# Patient Record
Sex: Female | Born: 1980 | Race: White | Hispanic: No | Marital: Single | Smoking: Former smoker
Health system: Southern US, Community
[De-identification: ages and names within clinical notes are randomized; demographics above are authoritative.]

## PROBLEM LIST (undated history)

## (undated) ENCOUNTER — Inpatient Hospital Stay (HOSPITAL_COMMUNITY): Payer: Self-pay

## (undated) DIAGNOSIS — F0630 Mood disorder due to known physiological condition, unspecified: Secondary | ICD-10-CM

## (undated) DIAGNOSIS — IMO0002 Reserved for concepts with insufficient information to code with codable children: Secondary | ICD-10-CM

## (undated) DIAGNOSIS — F0781 Postconcussional syndrome: Secondary | ICD-10-CM

## (undated) DIAGNOSIS — M24419 Recurrent dislocation, unspecified shoulder: Secondary | ICD-10-CM

## (undated) DIAGNOSIS — B1920 Unspecified viral hepatitis C without hepatic coma: Secondary | ICD-10-CM

## (undated) HISTORY — PX: NO PAST SURGERIES: SHX2092

---

## 2000-06-20 ENCOUNTER — Other Ambulatory Visit: Admission: RE | Admit: 2000-06-20 | Discharge: 2000-06-20 | Payer: Self-pay | Admitting: Obstetrics and Gynecology

## 2001-09-08 ENCOUNTER — Emergency Department (HOSPITAL_COMMUNITY): Admission: EM | Admit: 2001-09-08 | Discharge: 2001-09-09 | Payer: Self-pay | Admitting: Emergency Medicine

## 2010-02-20 ENCOUNTER — Ambulatory Visit: Payer: Self-pay | Admitting: Psychiatry

## 2010-02-20 ENCOUNTER — Other Ambulatory Visit: Payer: Self-pay | Admitting: Emergency Medicine

## 2010-02-20 ENCOUNTER — Inpatient Hospital Stay (HOSPITAL_COMMUNITY): Admission: AD | Admit: 2010-02-20 | Discharge: 2010-03-12 | Payer: Self-pay | Admitting: Psychiatry

## 2010-09-28 LAB — T4, FREE: Free T4: 1.05 ng/dL (ref 0.80–1.80)

## 2010-09-28 LAB — BASIC METABOLIC PANEL
Chloride: 110 mEq/L (ref 96–112)
Creatinine, Ser: 0.73 mg/dL (ref 0.4–1.2)
GFR calc Af Amer: >60 mL/min (ref >60)
Potassium: 4.7 mEq/L (ref 3.5–5.1)
Sodium: 141 mEq/L (ref 135–145)

## 2010-09-28 LAB — URINALYSIS, ROUTINE W REFLEX MICROSCOPIC
Glucose, UA: NEGATIVE mg/dL
Glucose, UA: NEGATIVE mg/dL
Hgb urine dipstick: NEGATIVE
Ketones, ur: NEGATIVE mg/dL
Protein, ur: NEGATIVE mg/dL
Specific Gravity, Urine: 1.024 (ref 1.005–1.030)
pH: 7.5 (ref 5.0–8.0)

## 2010-09-28 LAB — DIFFERENTIAL
Eosinophils Relative: 0 % (ref 0–5)
Lymphocytes Relative: 27 % (ref 12–46)
Lymphs Abs: 1.2 10*3/uL (ref 0.7–4.0)

## 2010-09-28 LAB — URINE MICROSCOPIC-ADD ON

## 2010-09-28 LAB — HEPATIC FUNCTION PANEL
ALT: 15 U/L (ref 0–35)
AST: 32 U/L (ref 0–37)
Bilirubin, Direct: <0.1 mg/dL (ref 0.0–0.3)
Total Protein: 7.2 g/dL (ref 6.0–8.3)

## 2010-09-28 LAB — CBC
MCV: 90.8 fL (ref 78.0–100.0)
Platelets: 257 10*3/uL (ref 150–400)
RBC: 3.9 MIL/uL (ref 3.87–5.11)
WBC: 4.5 10*3/uL (ref 4.0–10.5)

## 2010-09-28 LAB — POCT PREGNANCY, URINE: Preg Test, Ur: NEGATIVE

## 2010-09-28 LAB — RAPID URINE DRUG SCREEN, HOSP PERFORMED: Benzodiazepines: POSITIVE — AB

## 2010-09-28 LAB — ETHANOL: Alcohol, Ethyl (B): <5 mg/dL (ref 0–10)

## 2010-09-28 LAB — T3, FREE: T3, Free: 2.1 pg/mL — ABNORMAL LOW (ref 2.3–4.2)

## 2010-10-01 ENCOUNTER — Emergency Department (HOSPITAL_COMMUNITY)
Admission: EM | Admit: 2010-10-01 | Discharge: 2010-10-02 | Disposition: A | Discharge status: Other Acute Inpatient Hospital | Payer: Medicaid Other | Source: Home / Self Care | Attending: Emergency Medicine | Admitting: Emergency Medicine

## 2010-10-01 DIAGNOSIS — M549 Dorsalgia, unspecified: Secondary | ICD-10-CM | POA: Insufficient documentation

## 2010-10-01 DIAGNOSIS — R61 Generalized hyperhidrosis: Secondary | ICD-10-CM | POA: Insufficient documentation

## 2010-10-01 DIAGNOSIS — F329 Major depressive disorder, single episode, unspecified: Secondary | ICD-10-CM | POA: Insufficient documentation

## 2010-10-01 DIAGNOSIS — F1110 Opioid abuse, uncomplicated: Secondary | ICD-10-CM | POA: Insufficient documentation

## 2010-10-01 DIAGNOSIS — R109 Unspecified abdominal pain: Secondary | ICD-10-CM | POA: Insufficient documentation

## 2010-10-01 DIAGNOSIS — F3289 Other specified depressive episodes: Secondary | ICD-10-CM | POA: Insufficient documentation

## 2010-10-01 LAB — RAPID URINE DRUG SCREEN, HOSP PERFORMED
Amphetamines: NOT DETECTED
Barbiturates: NOT DETECTED
Benzodiazepines: NOT DETECTED
Cocaine: NOT DETECTED

## 2010-10-01 LAB — DIFFERENTIAL
Lymphocytes Relative: 37 % (ref 12–46)
Lymphs Abs: 1.6 10*3/uL (ref 0.7–4.0)
Monocytes Relative: 4 % (ref 3–12)
Neutro Abs: 2.4 10*3/uL (ref 1.7–7.7)
Neutrophils Relative %: 57 % (ref 43–77)

## 2010-10-01 LAB — BASIC METABOLIC PANEL
BUN: 9 mg/dL (ref 6–23)
Chloride: 106 mEq/L (ref 96–112)
Glucose, Bld: 124 mg/dL — ABNORMAL HIGH (ref 70–99)
Potassium: 3.9 mEq/L (ref 3.5–5.1)

## 2010-10-01 LAB — CBC
HCT: 35.1 % — ABNORMAL LOW (ref 36.0–46.0)
Hemoglobin: 11.6 g/dL — ABNORMAL LOW (ref 12.0–15.0)
MCH: 28.4 pg (ref 26.0–34.0)
MCV: 86 fL (ref 78.0–100.0)
RBC: 4.08 MIL/uL (ref 3.87–5.11)

## 2010-10-02 ENCOUNTER — Inpatient Hospital Stay (HOSPITAL_COMMUNITY)
Admission: RE | Admit: 2010-10-02 | Discharge: 2010-10-08 | DRG: 897 | Disposition: A | Discharge status: Home / Self Care | Payer: Medicaid Other | Source: Ambulatory Visit | Attending: Psychiatry | Admitting: Psychiatry

## 2010-10-02 DIAGNOSIS — F1120 Opioid dependence, uncomplicated: Principal | ICD-10-CM

## 2010-10-02 DIAGNOSIS — G8929 Other chronic pain: Secondary | ICD-10-CM

## 2010-10-02 DIAGNOSIS — M545 Low back pain, unspecified: Secondary | ICD-10-CM

## 2010-10-02 DIAGNOSIS — Z560 Unemployment, unspecified: Secondary | ICD-10-CM

## 2010-10-02 DIAGNOSIS — N390 Urinary tract infection, site not specified: Secondary | ICD-10-CM

## 2010-10-04 LAB — URINALYSIS, ROUTINE W REFLEX MICROSCOPIC
Bilirubin Urine: NEGATIVE
Glucose, UA: NEGATIVE mg/dL
Ketones, ur: NEGATIVE mg/dL
Protein, ur: NEGATIVE mg/dL

## 2010-10-04 LAB — URINE MICROSCOPIC-ADD ON

## 2010-10-04 LAB — PREGNANCY, URINE: Preg Test, Ur: NEGATIVE

## 2010-10-05 LAB — GC/CHLAMYDIA PROBE AMP, URINE: Chlamydia, Swab/Urine, PCR: NEGATIVE

## 2010-10-06 LAB — URINE CULTURE: Special Requests: NEGATIVE

## 2010-10-07 NOTE — H&P (Signed)
  NAMEMARVETTE, [name]              ACCOUNT NO.:  [id number]  MEDICAL RECORD NO.:  [id number]           PATIENT TYPE:  I  LOCATION:  0305                          FACILITY:  [facility]  PHYSICIAN:  Anselm Jungling, MD  DATE OF BIRTH:  May 11, 1981  DATE OF ADMISSION:  10/02/2010 DATE OF DISCHARGE:                      PSYCHIATRIC ADMISSION ASSESSMENT   This is a 30 year old female voluntarily admitted on October 02, 2010.  HISTORY OF PRESENT ILLNESS:  Patient is here with a history of opiate use, using up to 100 to 200 dollars worth of heroin daily, relapsed in December 2011.  She was sober from heroin for approximately 4 months after her last admission August 2011.  Her last use was 2 days ago.  She has been using intravenously.  She is just tired of her drug use.  She wants to go to school, be there for her children.  She denies any suicidal thoughts.  She has been in multiple rehab facilities.  She is having difficulty sleeping.  She has been on multiple medications which she receives little benefit from.  Denies any suicidal or homicidal thoughts.  PAST PSYCHIATRIC HISTORY:  Again, patient was here in August 2011 for opiate dependence.  SOCIAL HISTORY:  This is a single female.  She has 2 children, ages 59 and 46 months of age.  Unemployed.  Denies any legal troubles.  FAMILY HISTORY:  None.  ALCOHOL AND DRUG HISTORY:  Denies any substance use.  PRIMARY CARE PROVIDER:  Unknown.  MEDICAL PROBLEMS:  Denies any acute or chronic health issues.  MEDICATIONS:  Birth control pills.  DRUG ALLERGIES:  NO KNOWN ALLERGIES.  PHYSICAL EXAM:  This is a well-nourished female in no acute distress. She was fully assessed in the [facility] Emergency Department.  She is having some sweating and shivering.  Her urine drug screen is positive for opiates.  Her BMET is within normal limits.  Alcohol level less than 5, hemoglobin 11.6, 34.1 hematocrit.  MENTAL STATUS EXAM:  Patient is fully alert  and cooperative.  Patient remembers me from her last hospitalization.  She is cooperative, good eye contact, wanting to get help.  Her thought processes are coherent and goal directed.  Cognitive function intact.  Her memory appears intact.  AXIS I:  Opiate dependence. AXIS II:  Deferred. AXIS III:  No known medical conditions. AXIS IV:  Other psychosocial problems rated to chronic substance use. AXIS V:  Current is 55.  PLAN:  Place patient on the clonidine protocol.  We will offer Lidoderm patch for her back pain.  We will assess motivation for rehab, assess triggers, continue to assess other comorbidities in her support group.  TENTATIVE LENGTH OF STAY:  At this time, is 2 to 4 days.     [name], N.P.   ______________________________ Anselm Jungling, MD    JO/MEDQ  D:  10/02/2010  T:  10/02/2010  Job:  [id number]  Electronically Signed by Limmie PatriciaP. on 10/05/2010 03:50:02 PM Electronically Signed by Geralyn Flash MD on 10/07/2010 11:39:06 AM

## 2010-10-10 NOTE — Discharge Summary (Signed)
NAMEBRITTA, LOUTH              ACCOUNT NO.:  [id number]  MEDICAL RECORD NO.:  [id number]           PATIENT TYPE:  I  LOCATION:  0305                          FACILITY:  [facility]  PHYSICIAN:  Anselm Jungling, MD  DATE OF BIRTH:  1980-08-11  DATE OF ADMISSION:  10/02/2010 DATE OF DISCHARGE:  10/08/2010                              DISCHARGE SUMMARY   DISCHARGE DIAGNOSES:  AXIS I:  Opiate dependence.  AXIS II:  Deferred.  AXIS III: 1. A history of urinary tract infection. 2. Low back pain.  AXIS IV:  Stressors severe.  AXIS V:  Global Assessment of Functioning on discharge 50.  IDENTIFYING DATA AND REASON FOR ADMISSION:  This was an inpatient psychiatric admission for [name], a 30 year old, single, Caucasian female, who was admitted, referred by herself, for treatment of heroin dependence.  Please refer to the admission note for further details pertaining to the symptoms, circumstances, and history that led to her hospitalization.  She was given an initial AXIS I diagnosis of opiate dependence.  MEDICAL AND LABORATORY:  The patient was medically and physically assessed by the psychiatric nurse practitioner.  She was in good health without any active or chronic medical problems, but did have some chronic low back pain that she states was due to the ongoing history of lower spine disk disease.  For this, she was treated with Lidoderm 5% patch with good results.  She was also identified with a urinary tract infection and was treated with Macrodantin 50 mg q.i.d., the course of which descended beyond her discharge date.  There were no other significant medical issues outside of her opiate detoxification.  HOSPITAL COURSE:  The patient was admitted to the adult inpatient psychiatric service.  She presented as a well-nourished, normally- developed adult female, who was pleasant and cooperative.  She verbalized a strong desire for help.  There were no suicidal thoughts, plans,  or intent at anytime during her stay.  She was detoxified utilizing a standard clonidine taper.  She did have significant withdrawal symptoms, but she was able to get through them in reasonably good spirits.  She commented that this was a more difficult course of detoxification that she had previously, corresponding to a more intensive abuse of opiates on this occasion compared to previous.  She participated in the therapeutic groups and activities geared towards a 12-step recovery.  She was a good participant throughout her stay.  She had a lot of motivation for ongoing sobriety and recovery, based on her having a child at which she is open to being able to continue to parent.  She had been living with her father.  She worked closely with Case Management towards aftercare planning.  She considered the possibility of a long-term residential program, but at the time of discharge, had settled on a plan to become involved in outpatient treatment as described below.  She was appropriate for discharge on the 7th hospital day.  She agreed to the follow the aftercare plan.  In the meantime, she had been started on Seroquel 100 mg q.h.s., which was helpful in stabilizing chronic insomnia.  AFTERCARE:  The  patient was to follow up with [facility] in [place], [place], on March 27th at 10:00 a.m.  DISCHARGE MEDICATIONS: 1. Macrodantin 50 mg q.i.d. until gone. 2. Seroquel 100 mg q.h.s. 3. Lidoderm 5% patch to back daily. 4. Continue usual oral contraceptive.  A suicide risk assessment was completed at the time of discharge, and she was felt to be at minimal risk.     Anselm Jungling, MD     SPB/MEDQ  D:  10/08/2010  T:  10/08/2010  Job:  [id number]  Electronically Signed by Geralyn Flash MD on 10/10/2010 07:44:26 AM

## 2011-05-14 ENCOUNTER — Emergency Department (HOSPITAL_COMMUNITY)
Admission: EM | Admit: 2011-05-14 | Discharge: 2011-05-15 | Disposition: A | Discharge status: Other Acute Inpatient Hospital | Payer: Medicaid Other | Attending: Emergency Medicine | Admitting: Emergency Medicine

## 2011-05-14 DIAGNOSIS — F411 Generalized anxiety disorder: Secondary | ICD-10-CM | POA: Insufficient documentation

## 2011-05-14 DIAGNOSIS — F329 Major depressive disorder, single episode, unspecified: Secondary | ICD-10-CM | POA: Insufficient documentation

## 2011-05-14 DIAGNOSIS — F3289 Other specified depressive episodes: Secondary | ICD-10-CM | POA: Insufficient documentation

## 2011-05-14 DIAGNOSIS — R45851 Suicidal ideations: Secondary | ICD-10-CM | POA: Insufficient documentation

## 2011-05-14 DIAGNOSIS — F1910 Other psychoactive substance abuse, uncomplicated: Secondary | ICD-10-CM | POA: Insufficient documentation

## 2011-05-14 LAB — CBC
HCT: 34 % — ABNORMAL LOW (ref 36.0–46.0)
Hemoglobin: 11.3 g/dL — ABNORMAL LOW (ref 12.0–15.0)
MCH: 28.6 pg (ref 26.0–34.0)
MCHC: 33.2 g/dL (ref 30.0–36.0)
MCV: 86.1 fL (ref 78.0–100.0)
RBC: 3.95 MIL/uL (ref 3.87–5.11)

## 2011-05-14 LAB — DIFFERENTIAL
Basophils Relative: 1 % (ref 0–1)
Lymphocytes Relative: 42 % (ref 12–46)
Lymphs Abs: 2.5 10*3/uL (ref 0.7–4.0)
Monocytes Absolute: 0.2 10*3/uL (ref 0.1–1.0)
Monocytes Relative: 3 % (ref 3–12)
Neutro Abs: 3.2 10*3/uL (ref 1.7–7.7)
Neutrophils Relative %: 54 % (ref 43–77)

## 2011-05-15 ENCOUNTER — Inpatient Hospital Stay (HOSPITAL_COMMUNITY)
Admission: AD | Admit: 2011-05-15 | Discharge: 2011-05-28 | DRG: 897 | Disposition: A | Discharge status: Home / Self Care | Payer: PRIVATE HEALTH INSURANCE | Source: Ambulatory Visit | Attending: Psychiatry | Admitting: Psychiatry

## 2011-05-15 DIAGNOSIS — F0630 Mood disorder due to known physiological condition, unspecified: Secondary | ICD-10-CM | POA: Diagnosis present

## 2011-05-15 DIAGNOSIS — Z79899 Other long term (current) drug therapy: Secondary | ICD-10-CM

## 2011-05-15 DIAGNOSIS — F4310 Post-traumatic stress disorder, unspecified: Secondary | ICD-10-CM

## 2011-05-15 DIAGNOSIS — F411 Generalized anxiety disorder: Secondary | ICD-10-CM

## 2011-05-15 DIAGNOSIS — F1020 Alcohol dependence, uncomplicated: Secondary | ICD-10-CM

## 2011-05-15 DIAGNOSIS — F0781 Postconcussional syndrome: Secondary | ICD-10-CM

## 2011-05-15 DIAGNOSIS — F39 Unspecified mood [affective] disorder: Secondary | ICD-10-CM

## 2011-05-15 DIAGNOSIS — F1120 Opioid dependence, uncomplicated: Principal | ICD-10-CM

## 2011-05-15 DIAGNOSIS — F329 Major depressive disorder, single episode, unspecified: Secondary | ICD-10-CM

## 2011-05-15 DIAGNOSIS — K219 Gastro-esophageal reflux disease without esophagitis: Secondary | ICD-10-CM

## 2011-05-15 DIAGNOSIS — R45851 Suicidal ideations: Secondary | ICD-10-CM

## 2011-05-15 DIAGNOSIS — Z87440 Personal history of urinary (tract) infections: Secondary | ICD-10-CM

## 2011-05-15 LAB — URINALYSIS, ROUTINE W REFLEX MICROSCOPIC
Hgb urine dipstick: NEGATIVE
Leukocytes, UA: NEGATIVE
Nitrite: NEGATIVE
Protein, ur: NEGATIVE mg/dL
Specific Gravity, Urine: 1.018 (ref 1.005–1.030)
Urobilinogen, UA: 0.2 mg/dL (ref 0.0–1.0)

## 2011-05-15 LAB — RAPID URINE DRUG SCREEN, HOSP PERFORMED
Cocaine: NOT DETECTED
Opiates: POSITIVE — AB

## 2011-05-15 LAB — BASIC METABOLIC PANEL
BUN: 11 mg/dL (ref 6–23)
CO2: 25 mEq/L (ref 19–32)
Chloride: 105 mEq/L (ref 96–112)
GFR calc non Af Amer: >90 mL/min (ref >90)
Glucose, Bld: 86 mg/dL (ref 70–99)
Potassium: 3.8 mEq/L (ref 3.5–5.1)
Sodium: 139 mEq/L (ref 135–145)

## 2011-05-16 DIAGNOSIS — F1120 Opioid dependence, uncomplicated: Secondary | ICD-10-CM

## 2011-05-16 LAB — HEPATIC FUNCTION PANEL
ALT: 9 U/L (ref 0–35)
AST: 28 U/L (ref 0–37)
Albumin: 4.1 g/dL (ref 3.5–5.2)
Bilirubin, Direct: <0.1 mg/dL (ref 0.0–0.3)
Total Bilirubin: 0.2 mg/dL — ABNORMAL LOW (ref 0.3–1.2)

## 2011-05-17 LAB — TSH: TSH: 0.151 u[IU]/mL — ABNORMAL LOW (ref 0.350–4.500)

## 2011-05-17 MED ORDER — ONDANSETRON 4 MG PO TBDP
4.0000 mg | ORAL_TABLET | Freq: Four times a day (QID) | ORAL | Status: AC | PRN
  Administered 2011-05-19: 4 mg via ORAL

## 2011-05-17 MED ORDER — TRAZODONE HCL 150 MG PO TABS
150.0000 mg | ORAL_TABLET | Freq: Every day | ORAL | Status: DC
  Administered 2011-05-18 – 2011-05-25 (×8): 150 mg via ORAL
  Administered 2011-05-26: 50 mg via ORAL
  Administered 2011-05-27: 150 mg via ORAL
  Filled 2011-05-17 (×7): qty 1
  Filled 2011-05-17: qty 42
  Filled 2011-05-17 (×3): qty 1
  Filled 2011-05-17: qty 42
  Filled 2011-05-17: qty 1

## 2011-05-17 MED ORDER — ACETAMINOPHEN 325 MG PO TABS
650.0000 mg | ORAL_TABLET | Freq: Four times a day (QID) | ORAL | Status: DC | PRN
  Administered 2011-05-22: 650 mg via ORAL

## 2011-05-17 MED ORDER — LOPERAMIDE HCL 2 MG PO CAPS: 2.0000 mg | ORAL_CAPSULE | ORAL | Status: AC | PRN

## 2011-05-17 MED ORDER — CHLORDIAZEPOXIDE HCL 25 MG PO CAPS
25.0000 mg | ORAL_CAPSULE | Freq: Once | ORAL | Status: AC
  Administered 2011-05-19: 25 mg via ORAL

## 2011-05-17 MED ORDER — DICYCLOMINE HCL 20 MG PO TABS
20.0000 mg | ORAL_TABLET | ORAL | Status: AC | PRN
  Administered 2011-05-19 (×2): 20 mg via ORAL

## 2011-05-17 MED ORDER — QUETIAPINE FUMARATE 100 MG PO TABS
100.0000 mg | ORAL_TABLET | Freq: Every day | ORAL | Status: DC
  Administered 2011-05-18 – 2011-05-24 (×7): 100 mg via ORAL
  Filled 2011-05-17 (×7): qty 1

## 2011-05-17 MED ORDER — NAPROXEN 500 MG PO TABS
500.0000 mg | ORAL_TABLET | Freq: Two times a day (BID) | ORAL | Status: AC | PRN
  Administered 2011-05-19 – 2011-05-20 (×3): 500 mg via ORAL
  Filled 2011-05-17 (×2): qty 1

## 2011-05-17 MED ORDER — METHOCARBAMOL 500 MG PO TABS
500.0000 mg | ORAL_TABLET | Freq: Three times a day (TID) | ORAL | Status: AC | PRN
  Administered 2011-05-19: 500 mg via ORAL

## 2011-05-17 MED ORDER — LIDOCAINE 5 % EX PTCH
1.0000 | MEDICATED_PATCH | Freq: Every day | CUTANEOUS | Status: DC
  Administered 2011-05-19 – 2011-05-27 (×10): 1 via TRANSDERMAL
  Filled 2011-05-17 (×10): qty 1
  Filled 2011-05-17: qty 5

## 2011-05-17 MED ORDER — CLONIDINE HCL 0.1 MG PO TABS
0.1000 mg | ORAL_TABLET | Freq: Two times a day (BID) | ORAL | Status: AC
  Administered 2011-05-19: 0.1 mg via ORAL
  Filled 2011-05-17: qty 1

## 2011-05-17 MED ORDER — VITAMIN B-1 100 MG PO TABS
100.0000 mg | ORAL_TABLET | Freq: Every day | ORAL | Status: DC
  Administered 2011-05-19 – 2011-05-28 (×10): 100 mg via ORAL
  Filled 2011-05-17 (×13): qty 1

## 2011-05-17 MED ORDER — HYDROXYZINE HCL 25 MG PO TABS
25.0000 mg | ORAL_TABLET | Freq: Four times a day (QID) | ORAL | Status: DC | PRN
  Administered 2011-05-19 (×2): 25 mg via ORAL
  Filled 2011-05-17 (×2): qty 1

## 2011-05-17 MED ORDER — ALUM & MAG HYDROXIDE-SIMETH 200-200-20 MG/5ML PO SUSP
30.0000 mL | ORAL | Status: DC | PRN
  Administered 2011-05-21 – 2011-05-22 (×3): 30 mL via ORAL

## 2011-05-17 MED ORDER — CLONIDINE HCL 0.1 MG PO TABS
0.1000 mg | ORAL_TABLET | Freq: Every day | ORAL | Status: AC
  Administered 2011-05-20 – 2011-05-21 (×2): 0.1 mg via ORAL
  Filled 2011-05-17 (×2): qty 1

## 2011-05-17 MED ORDER — THERA M PLUS PO TABS
1.0000 | ORAL_TABLET | Freq: Every day | ORAL | Status: DC
  Administered 2011-05-19 – 2011-05-23 (×5): 1 via ORAL
  Administered 2011-05-24: 08:00:00 via ORAL
  Administered 2011-05-25 – 2011-05-28 (×4): 1 via ORAL
  Filled 2011-05-17 (×11): qty 1

## 2011-05-17 MED ORDER — MAGNESIUM HYDROXIDE 400 MG/5ML PO SUSP: 30.0000 mL | Freq: Every day | ORAL | Status: DC | PRN

## 2011-05-17 MED ORDER — RAMELTEON 8 MG PO TABS
8.0000 mg | ORAL_TABLET | Freq: Every day | ORAL | Status: DC
  Administered 2011-05-18 – 2011-05-27 (×10): 8 mg via ORAL
  Filled 2011-05-17 (×7): qty 1
  Filled 2011-05-17: qty 14
  Filled 2011-05-17 (×3): qty 1

## 2011-05-19 MED ORDER — DICLOFENAC SODIUM 1 % TD GEL
Freq: Four times a day (QID) | TRANSDERMAL | Status: DC | PRN
  Administered 2011-05-19 – 2011-05-21 (×3): via TOPICAL
  Administered 2011-05-23 – 2011-05-25 (×2): 1 via TOPICAL
  Administered 2011-05-25 – 2011-05-26 (×3): via TOPICAL
  Filled 2011-05-19: qty 100

## 2011-05-19 MED ORDER — DICLOFENAC SODIUM 1 % TD GEL: Freq: Four times a day (QID) | TRANSDERMAL | Status: DC

## 2011-05-19 MED ORDER — ASPIRIN-ACETAMINOPHEN-CAFFEINE 250-250-65 MG PO TABS
2.0000 | ORAL_TABLET | ORAL | Status: AC | PRN
  Administered 2011-05-19 – 2011-05-20 (×2): 2 via ORAL
  Administered 2011-05-20: 1 via ORAL
  Administered 2011-05-21 – 2011-05-22 (×2): 2 via ORAL
  Filled 2011-05-19: qty 2

## 2011-05-19 MED ORDER — CHLORDIAZEPOXIDE HCL 25 MG PO CAPS
25.0000 mg | ORAL_CAPSULE | Freq: Once | ORAL | Status: AC
  Administered 2011-05-19: 25 mg via ORAL

## 2011-05-19 MED ORDER — CHLORDIAZEPOXIDE HCL 25 MG PO CAPS
ORAL_CAPSULE | ORAL | Status: AC
  Administered 2011-05-19: 23:00:00
  Filled 2011-05-19: qty 1

## 2011-05-19 NOTE — Progress Notes (Signed)
[facility] Group Notes:  (Counselor/Nursing/MHT/Case Management/Adjunct)  05/19/2011 1:15 PM  Type of Therapy:  Group Therapy, Dance/Movement Therapy   Participation Level:  Active  Participation Quality:  Attentive, Sharing and Supportive  Affect:  Appropriate  Cognitive:  Appropriate  Insight:  Limited  Engagement in Group:  Good  Engagement in Therapy:  Limited  Modes of Intervention:  Clarification, Problem-solving, Role-play, Socialization and Support  Summary of Progress/Problems: pt participated in a group discussion on how to use supports to change negative cycles of relapse. Pt identified one positive support as her kids and father pt stated that they "give her unconditional love". Pt spoke about wanting to have faith and make things different for herself. She stated she would use prayer more. [facility] 05/19/2011
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
## 2011-05-19 NOTE — Progress Notes (Signed)
  05-19-11  Pt has had cramping and anxiety today and given prn accordingly. She has been cooperative and easily direct able. On her self inventory she wrote  Sleep poor, she requested medication, appetite poor, attention poor, depression at 8 and hopelessness at 5.  Withdrawal symptoms and have been anxiety, cramping, sweats and agitation. She having passive si but able to contract.  Her physical problems have been lightheadedness. She has had c/o discomfort. RN will monitor and q 15 min checks continue.

## 2011-05-20 MED ORDER — PRAZOSIN HCL 1 MG PO CAPS
2.0000 mg | ORAL_CAPSULE | Freq: Every evening | ORAL | Status: DC | PRN
  Administered 2011-05-20 – 2011-05-21 (×3): 2 mg via ORAL
  Filled 2011-05-20 (×6): qty 2

## 2011-05-20 MED ORDER — FLUOXETINE HCL 20 MG PO CAPS
20.0000 mg | ORAL_CAPSULE | Freq: Every day | ORAL | Status: DC
  Administered 2011-05-20 – 2011-05-28 (×9): 20 mg via ORAL
  Filled 2011-05-20 (×3): qty 1
  Filled 2011-05-20: qty 14
  Filled 2011-05-20 (×7): qty 1

## 2011-05-20 MED ORDER — PRAZOSIN HCL 2 MG PO CAPS
2.0000 mg | ORAL_CAPSULE | Freq: Every evening | ORAL | Status: DC | PRN
  Filled 2011-05-20 (×2): qty 1

## 2011-05-20 MED ORDER — CHLORPROMAZINE HCL 50 MG PO TABS
50.0000 mg | ORAL_TABLET | Freq: Four times a day (QID) | ORAL | Status: DC
  Administered 2011-05-20 – 2011-05-22 (×7): 50 mg via ORAL
  Filled 2011-05-20 (×13): qty 1

## 2011-05-20 MED ORDER — METHOCARBAMOL 500 MG PO TABS
1000.0000 mg | ORAL_TABLET | Freq: Three times a day (TID) | ORAL | Status: DC
  Administered 2011-05-20 – 2011-05-28 (×24): 1000 mg via ORAL
  Filled 2011-05-20 (×6): qty 2
  Filled 2011-05-20: qty 1
  Filled 2011-05-20: qty 2
  Filled 2011-05-20 (×2): qty 30
  Filled 2011-05-20 (×2): qty 2
  Filled 2011-05-20: qty 30
  Filled 2011-05-20 (×17): qty 2

## 2011-05-20 NOTE — Progress Notes (Addendum)
  Patient was seen in group this morning she stated she wanted to go to a 14 day program at [facility] or a 30, 60, or 90 day program at [facility]. In the consultation room the patient requested medications for her back. Robaxin had been helpful and she asked for that back. Will order that. She describes PTSD symptoms with nightmares. Will prescribe Minipress to suppress the nightmares. She states her mother has is doing well on Prozac we'll prescribe that for her for depression and PTSD and anxiety. She is anticipating a visit from her son for 30 minutes. She will not be allowed to see him for 6 months. She is anticipating that she will have some emotional outburst after that and asks to have some medication to help her with that. Will try Thorazine 50 mg 4 times a day. Patient was wrought with a lot of guilt and shame and gave her an analogy of gravity and walking to compare it to her addiction and her recovery. It seemed to make some sense and she was able to feel okay about herself and about focusing on the next right thing to do.   The patient was advised to write down her attorneys names their phone numbers and if she has them they're fax numbers. She was advised to call each and find out when the court dates are for her upcoming appearances. She will submit that to [facility] who will write letters on her behalf to allow her appearances to be postponed for treatment.

## 2011-05-20 NOTE — Progress Notes (Signed)
[facility] Group Notes:  (Counselor/Nursing/MHT/Case Management/Adjunct)  05/20/2011 2:17 PM  Type of Therapy:  GROUP THERAPY 11:00AM  Participation Level:  Did Not Attend  Participation Quality:    Affect:  Cognitive:   Insight:    Engagement in Group:    Engagement in Therapy:  Modes of Intervention:    Summary of Progress/Problems:   [name] 05/20/2011, 2:17 PM

## 2011-05-20 NOTE — Progress Notes (Signed)
[facility] Group Notes:  (Counselor/Nursing/MHT/Case Management/Adjunct)  05/20/2011 4:06 PM  Type of Therapy: Group Therapy 1:15 PM  Participation Level:  Active  Participation Quality:  Appropriate, Attentive and Sharing  Affect:  Appropriate  Cognitive:  Appropriate  Insight:  Good  Engagement in Group:  Good  Engagement in Therapy:  Good  Modes of Intervention:  Education, Socialization and Support  Summary of Progress/Problems: Pt came in late to [facility] volunteer presentation yet was attentive and interested in material. Pt verified location of services and asked pertinent questions and expressed that the availability of such services esp for no cost were of great appeal to her. Pt showed particular interest in Women's well ness support group   Clide Dales 05/20/2011, 4:06 PM

## 2011-05-20 NOTE — Progress Notes (Signed)
Pt attended AM group.  Stated she is having rough detox, and that she is dehydrated and has been pushed by staff to take in liquids on an on-going basis.  Patient did not have container of liquid with her.  Sent her to get it.  Was very focused on children and court dates; less so on rehab.  Reminded her that the plan is to go to [facility], then [facility], and we will provide her with letters for her attorneys so her dates can be continued.

## 2011-05-20 NOTE — Progress Notes (Signed)
Nursing Note 05/20/11 1410 Pt. Appears to have had a better day.She rates her Depression and Hopelessness as 8/10.She is med compliant and attending [facility].She is still having cravings and lack of energy.Encouraged and supported.

## 2011-05-20 NOTE — Progress Notes (Signed)
  Nursing Note Pt. Is med compliant and participating in all Groups.Still has multiple complaints of pain adiscomfort and is medicated fond r complaints.Still rates her Depression and Hopelessness as 9/10.Encouraged and supported.

## 2011-05-20 NOTE — Progress Notes (Signed)
Pt has been positive for group.  Still complains of withdrawal and anxiety.  Still reports feeling very shaky.  No acute distress noted, denies SI/HI/hallucinations.  Support and encouragement offered.  Will continue to monitor.

## 2011-05-21 MED ORDER — PANTOPRAZOLE SODIUM 40 MG PO TBEC
40.0000 mg | DELAYED_RELEASE_TABLET | Freq: Every day | ORAL | Status: DC
  Administered 2011-05-21: 40 mg via ORAL
  Filled 2011-05-21 (×2): qty 1

## 2011-05-21 MED ORDER — ONDANSETRON HCL 4 MG PO TABS: 4.0000 mg | ORAL_TABLET | Freq: Four times a day (QID) | ORAL | Status: DC | PRN

## 2011-05-21 MED ORDER — ONDANSETRON 4 MG PO TBDP: 4.0000 mg | ORAL_TABLET | Freq: Four times a day (QID) | ORAL | Status: DC | PRN

## 2011-05-21 NOTE — Progress Notes (Signed)
Recreation Therapy Group Note   Date: 05/21/2011   Time: 1000   Group Topic/Focus: Patient invited to participate in animal assisted therapy. Pets as a coping skill and responsibility were discussed.   Participation Level:  Active   Participation Quality:  Appropriate and Attentive   Affect:  Appropriate   Cognitive:  Appropriate and Oriented   Additional Comments: Patient bright, talking about her pets at home.    Nonnie Pickney  05/21/2011 12:00 PM

## 2011-05-21 NOTE — Progress Notes (Signed)
Pt in AM group.  C/O withdrawal symptoms.  No referral to [facility] today.

## 2011-05-21 NOTE — Progress Notes (Signed)
  The patient was seen in group this morning. She states that she slept 5 hours last night. She states she had a rough night last night saying goodbye to her son for 6 months. Her mother was most upset at that goodbye. She feels very comfortable with the plan for her son's father to take him for 6 months and feels liberated to go to [facility] for 14 days and then to [facility] for 90 days. She feels convinced that she needs a longer rehabilitation at this time in order to get a much better entrenched in 2 sobriety life course. Patient states that she is still having nausea and so will renew her Zofran. She also states she has epigastric burning consistent with GERD, will go ahead and start her on Protonix. She feels she might be ready for going to [facility] tomorrow or the next day. That seems to make sense.

## 2011-05-21 NOTE — Progress Notes (Signed)
[facility] Group Notes:  (Counselor/Nursing/MHT/Case Management/Adjunct)  05/21/2011 12:53 PM  Type of Therapy:  Process Group Therapy @ 11am  Participation Level:  Active  Participation Quality:  Appropriate  Affect:  Appropriate  Cognitive:  Alert  Insight:  Good  Engagement in Group:  Good  Engagement in Therapy:  Good  Modes of Intervention:  Support  Summary of Progress/Problems: Pt stated that she felt scared regarding leaving the hospital upon discharge because she does not want to relapse.  Her desire is to remain sober and work on regaining custody of her two children  [name] 05/21/2011, 12:53 PM

## 2011-05-21 NOTE — Progress Notes (Signed)
Pt in dayroom, watching TV with peers on approach. Appears flat and depressed. Calm and cooperative with assessment. Open and spontaneous in conversation. A/Ox4. No acute distress noted. States she has had a a bad day. When asked to qualify bad day, stated she has had a particularly bad detox this time. States she has felt dizzy and has a very poor appetite. Support and encouragement provided. Went on to detail habit and conditions leading up to her admission. States she has been using 200 a day in opiates and "boosting" to support her habit. Encouraged pt to continue to focus on fluids until her appetite returns. Also encouraged pt to rise slowly and try to walk close to hallway walls when ambulating. 800cc of gatorade provided. Otherwise no questions or concerns. Denies pain or discomfort. Denies SI/HI/AVH and contracts for safety. POC and medications for the shift reviewed and understanding verbalized. Safety has been maintained with Q73minute observation. Will continue current POC.

## 2011-05-21 NOTE — Progress Notes (Signed)
Recreation Therapy Group Note  Date: 05/21/2011         Time: 1415      Group Topic/Focus: The focus of this group is on discussing various styles of communication and communicating assertively using 'I' (feeling) statements.   Participation Level: Active  Participation Quality: Appropriate and Attentive  Affect: Blunted  Cognitive: Oriented   Additional Comments: Patient blunt and reported not feeling well, says she hasn't been feeling well for five days but didn't mention any of that in the morning group. When practicing I-statements patient talked about how she feels guilty she stole from her parents, patient sat with a blanket over her head for the remainder of group and wouldn't process with R.T.   Montie Gelardi 05/21/2011 4:21 PM

## 2011-05-21 NOTE — Progress Notes (Signed)
Pt. Excited that she saw son today.  Pt. Worried that son may not know her when he returns from father after six month. Writer encouraged pt. To focus on her well being so she will be better able to care for son.  Pt. Agrees.  Pt. Showed me pictures of children. Pt. Preparing for group.  Denies SHI, staff will continue to monitor q44min for safety.

## 2011-05-22 DIAGNOSIS — F1120 Opioid dependence, uncomplicated: Secondary | ICD-10-CM | POA: Diagnosis present

## 2011-05-22 MED ORDER — PRAZOSIN HCL 1 MG PO CAPS
1.0000 mg | ORAL_CAPSULE | Freq: Every evening | ORAL | Status: DC | PRN
  Administered 2011-05-22 – 2011-05-26 (×6): 1 mg via ORAL
  Filled 2011-05-22 (×12): qty 1

## 2011-05-22 MED ORDER — CHLORPROMAZINE HCL 25 MG PO TABS
25.0000 mg | ORAL_TABLET | Freq: Four times a day (QID) | ORAL | Status: DC
  Administered 2011-05-22 – 2011-05-24 (×10): 25 mg via ORAL
  Filled 2011-05-22 (×17): qty 1

## 2011-05-22 MED ORDER — PANTOPRAZOLE SODIUM 40 MG PO TBEC
40.0000 mg | DELAYED_RELEASE_TABLET | Freq: Two times a day (BID) | ORAL | Status: DC
  Administered 2011-05-22 – 2011-05-28 (×13): 40 mg via ORAL
  Filled 2011-05-22 (×17): qty 1
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
## 2011-05-22 NOTE — Progress Notes (Signed)
Patient has been attending groups and participating.  She denies any HI/AVH.  She is concerned that she is being discharged tomorrow because she feels she is not ready.  She has passive SI, but contracts for safety.  Patient states that she feels better since her thorazine was decreased; she is less sedated.  Patient is less intrusive and her mood has improved.  Her eye contact is good and thought processes intact.  Continue to assess and maintain safety.  She rates her depression as a 9; her hopelessness as a 7.

## 2011-05-22 NOTE — Progress Notes (Signed)
Pt in dayroom, watching TV with peers on approach. Appears anxious.  Cooperative with assessment. Open and spontaneous in conversation. A/Ox4. No acute distress noted. States she has had a better day. When asked to qualify betterday, stated she has felt better since the MD reduced her medications. States she hasnt felt dizzy and has had a little more energy. Endorsed anxiety r/t not knowing if her son was safely in [place] as she hasnt been able to reach his father. Support and encouragement provided. Otherwise no questions or concerns. Continues to be motivated in her sobriety and is eager to continue her treatment in a residential program. Hopeful her efforts and "drug court" conditions will help her avoid some of her legal issues. Did endorse low back pain and prn requested. PRN provided. Endorses passive Si but contracts for safety. Denies HI/AVH and contracts for safety. POC and medications for the shift reviewed and understanding verbalized. Safety has been maintained with Q43minute observation. Will f/u response to prn and continue current POC.

## 2011-05-22 NOTE — Progress Notes (Signed)
  Patient was seen in the morning. Discharge planning group. She stated she is still feeling quite dizzy, but was to our care. She denies having any withdrawal symptoms, but is noticing a profound amount of dizziness. As noted she is on Thorazine 50 mg 4 times a day Minipress 2 mg at night time and these may be compensating her being able to be vertical and feel okay. We'll reduce that to her at Thorazine to 25 mg 4 times a day and reduce the Minipress at night to only 1 mg.  The patient has been cooperative with case management and is willing to seek a referral to [facility] tomorrow.

## 2011-05-23 MED ORDER — IBUPROFEN 800 MG PO TABS
800.0000 mg | ORAL_TABLET | Freq: Four times a day (QID) | ORAL | Status: DC | PRN
  Administered 2011-05-23 – 2011-05-28 (×10): 800 mg via ORAL
  Filled 2011-05-23 (×9): qty 1

## 2011-05-23 MED ORDER — CARBAMAZEPINE 200 MG PO TABS
200.0000 mg | ORAL_TABLET | Freq: Three times a day (TID) | ORAL | Status: DC
  Administered 2011-05-23 – 2011-05-26 (×9): 200 mg via ORAL
  Filled 2011-05-23 (×16): qty 1
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
## 2011-05-23 NOTE — Progress Notes (Signed)
Recreation Therapy Group Note  Date: 05/23/2011         Time: 1415      Group Topic/Focus: The focus of the group is on enhancing the patients' ability to cope with stressors by understanding what coping is, why it is important, the negative effects of stress and developing healthier coping skills. Patients asked to complete a fifteen minute plan, outlining three triggers, three supports, and fifteen coping activities.   Participation Level: Active  Participation Quality: Appropriate  Affect: Appropriate  Cognitive: Oriented   Additional Comments: Patient apologized for group Tuesday, said her medication made her too tired and she had fallen asleep early. Patient irritable when asked to think about what triggers her substance abuse, but was redirectable.   Alfie Alderfer 05/23/2011 3:38 PM

## 2011-05-23 NOTE — Progress Notes (Signed)
Interdisciplinary Treatment Plan Update (Adult)  Date:  05/23/2011 Time Reviewed:  4:30 PM  Progress in Treatment: Attending groups: Yes. Participating in groups:  Yes. Taking medication as prescribed:  Yes. Tolerating medication:  Yes. Family/Significant othe contact made:  Yes, individual(s) contacted:  Contact made to Father Patient understands diagnosis:  Yes. and As evidenced by:  admitting to opiate dependence Discussing patient identified problems/goals with staff:  Yes. and As evidenced by:  verbalizing need to go to rehab post d/c Medical problems stabilized or resolved:  No. and As evidenced by:  c/o diziness for past 36 hours-Dr addressing with med adjustments Denies suicidal/homicidal ideation: Yes. and As evidenced by:  self-inventory Issues/concerns per patient self-inventory:  No. Other:  New problem(s) identified: No, Describe:  no problem to describe  Reason for Continuation of Hospitalization: Anxiety Medical Issues Medication stabilization Withdrawal symptoms  Interventions implemented related to continuation of hospitalization:  Medication adjustment  Additional comments:  Estimated length of stay: 3 days  Discharge Plan: [facility] or other as identified by pt.; 8am appointment on Monday 06/04/11 at [facility]):  Review of initial/current patient goals per problem list:   1.  Goal(s): Substance Use  Met:  No  Target date: Discharge  As evidenced by: Completing Detox  2.  Goal (s): Depression  Met:  No  Target date:Discharge  As evidenced by: Decrease depression from 10 to 3  3.  Goal(s): Suicide Ideation  Met:  Yes  Target date:  As evidenced by:  4.  Goal(s):  Met:  Yes  Target date:   As evidenced by:  Attendees: Patient:     Family:     Physician:  Nicholes Calamity 05/24/2011 11:00 AM   Nursing:   Marietta Cellar RN 05/24/2011 11:00 AM    Case Manager:  Richelle Ito LCSW 05/24/2011 11:01 AM    Counselor:  Vanetta Mulders LCAS  05/24/2011 11:03 AM    Other:  Reyes Ivan LCSWA 05/24/2011 11:04 AM    Other:   Other:     Other:   11/8/20124:30 PM   Scribe for Treatment Team:   Carney Bern, LCSWA 05/24/2011 11:05 AM

## 2011-05-23 NOTE — Progress Notes (Signed)
[facility] Group Notes:  (Counselor/Nursing/MHT/Case Management/Adjunct)  05/23/2011 1:00 PM  Type of Therapy:  group therapy  Participation Level:  Active  Participation Quality:  Appropriate  Affect:  Labile  Cognitive:  Oriented  Insight:  Limited  Engagement in Group:  Good  Engagement in Therapy:  Good  Modes of Intervention:  Problem-solving, Support and exploration  Summary of Progress/Problems:  Pt continues to share with group her anger over her legal issues and the blame she feels towards her parents for not dropping charges on her, pt stated she hates her mother. Pt appears to be having a difficult time understanding her actions caused her current circumstances. Pt shared she feels a feeling of loss over her children and anger over the fact that one one of the father's of her children now knows she was a heroin addict and feels it is not his business. Pt also stated that she was a good mother and her drug addiction did not impact her children. Pt lacks insight. 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 Pt also shared that she will prey "God get into my head before I do"   [name] 05/23/2011, 1:00 PM

## 2011-05-23 NOTE — Progress Notes (Signed)
Patient has been attending groups and participating.  She is unsure about discharge; she remains with passive SI.  She does contract for safety on the unit.  She is interacting well on the unit.  She is having minimal withdrawal symptoms at this time.  She rates her depression as a 10; her hopelessness as a 7.  She continues to complain of continuous back pain which is

## 2011-05-24 DIAGNOSIS — F0630 Mood disorder due to known physiological condition, unspecified: Secondary | ICD-10-CM

## 2011-05-24 DIAGNOSIS — F0781 Postconcussional syndrome: Secondary | ICD-10-CM | POA: Diagnosis present

## 2011-05-24 HISTORY — DX: Postconcussional syndrome: F07.81

## 2011-05-24 HISTORY — DX: Mood disorder due to known physiological condition, unspecified: F06.30

## 2011-05-24 LAB — T4, FREE: Free T4: 0.99 ng/dL (ref 0.80–1.80)

## 2011-05-24 MED ORDER — CHLORPROMAZINE HCL 25 MG PO TABS
12.5000 mg | ORAL_TABLET | Freq: Four times a day (QID) | ORAL | Status: DC
  Administered 2011-05-24 – 2011-05-25 (×5): 12.5 mg via ORAL
  Administered 2011-05-26: 25 mg via ORAL
  Administered 2011-05-26 – 2011-05-27 (×4): 12.5 mg via ORAL
  Filled 2011-05-24 (×13): qty 1

## 2011-05-24 MED ORDER — CARBAMAZEPINE 200 MG PO TABS
200.0000 mg | ORAL_TABLET | Freq: Every evening | ORAL | Status: DC
Start: 2011-05-24 — End: 2011-05-25
  Administered 2011-05-24: 200 mg via ORAL
  Filled 2011-05-24: qty 1

## 2011-05-24 NOTE — Progress Notes (Signed)
[facility] Group Notes:  (Counselor/Nursing/MHT/Case Management/Adjunct)  05/24/2011 3:26 PM  Type of Therapy:  Group Therapy at 1:15pm  Participation Level:  Minimal  Participation Quality:  Drowsy  Affect:  Appropriate  Cognitive:  Appropriate  Insight:  None  Engagement in Group:  Limited  Engagement in Therapy:  Limited  Modes of Intervention:  Exploration  Summary of Progress/Problems: Haely provided an example of a trigger (IE places) for relapse and then fell asleep   Clide Dales 05/24/2011, 3:26 PM

## 2011-05-24 NOTE — Progress Notes (Signed)
  Pt is a 30 yr female that presents flat in affect and depressed in mood. Pt denies SI, but admits that she has thoughts of harming her mother who has made reports on her to decrease her chances of having custody of her kids. Pt states that she is trying to clean her self up, so she can stay out of trouble and have a more favorable outcome in keeping her kids. Pt presents with no other concerns and was present for [employer]. Comfort and availability as needed has been extended to the patient. Patient safety remains with q70min checks.

## 2011-05-24 NOTE — Progress Notes (Signed)
Pt requested meds for sleep last night, slept "fair". Appetite is poor, energy level low. Pt rates depression and hopelessness as a "9". Pt c/o physical symptoms of sedation/chilling, cravings and agitation. Pt also rates her hopelessness and depression and has SI "off and on", but contracts for safety. Pt c/o no energy. Pt has minimal insight, her goal at this time is "not to use heroin." Treatment team plans for pt to be D/Ced on Monday, either going to [facility] or home with father to f/u at [facility].

## 2011-05-24 NOTE — Progress Notes (Signed)
Patient seen in discharge planning group this AM, where she noted that she is still quite dizzy from the Thorazine, (will lower the dose of that.)  She re[ported that she had contacted one of her lawyers and got the Fax number of another.  She is only sleeping 5 hours a night.  (With the pushing of the Tegretol, I an hoping that this will get better.  It is purposed that she got to 470m on Sat HS and 600mg  on Sun.)  The Case manager wrote a letter on her behalf about her being admitted to [facility] for medical reasons and that her court appearences should be postponed.  It is purposed that she stay until Mon and then see if she can go to [facility] or go to her father's and wait until her referral to [facility] on the 20th.  Thyroid TSH is low and Free T3 is low as well.  Will need to followup with her family doctor for her potential pan hypopit.  She will call her daughter today.  This afternoon she reported that she was able to talk to her son and that he arrived in [place] safely.  This was reassuring to her.

## 2011-05-24 NOTE — Progress Notes (Signed)
Have been unable to refer pt to [facility] all week due to c/o dizziness, meds not right.  Let her know in group this AM that she will be d/ced on Monday.  Asked her if there was not an opening at [facility], where she would go at d/c.  Pt identified plan to go home with father, and then go to [facility] rehab assessment on 11/20 at 8AM.  Follow up at [facility] for medication management.

## 2011-05-24 NOTE — Progress Notes (Signed)
[facility] Group Notes:  (Counselor/Nursing/MHT/Case Management/Adjunct)  05/24/2011 2:16 PM  Type of Therapy:  group therapy  Participation Level:  Active  Participation Quality:  Appropriate  Affect:  Appropriate  Cognitive:  Appropriate  Insight:  Good  Engagement in Group:  Good  Engagement in Therapy:  Good  Modes of Intervention:  Problem-solving, Support and group therapy  Summary of Progress/Problems:  [name] processed thoughts and feelings surrounding relapse and recovery. She spoke of relapse as torture. She is sad about her mistakes as they have caused a rift in her family. She appears motivated for her children. Pt was able to provide feedback and support.   [name] 05/24/2011, 2:16 PM

## 2011-05-25 MED ORDER — CARBAMAZEPINE 200 MG PO TABS
200.0000 mg | ORAL_TABLET | Freq: Every day | ORAL | Status: DC
  Administered 2011-05-25: 200 mg via ORAL
  Filled 2011-05-25 (×2): qty 1

## 2011-05-25 MED FILL — Carbamazepine Tab 200 MG: ORAL | Qty: 1 | Status: AC

## 2011-05-25 NOTE — Progress Notes (Signed)
NRSG:  Pt interacting well within the milieu, denies complaints other than backpain, feels ready for discharge on Monday.  Pt states Thorazine has sedated her and made her see double in past, does not wish to take it anymore except at bedtime.  Denies SI/HI/hallucinations.  Support and encouragement offered, will continue to monitor.

## 2011-05-25 NOTE — Progress Notes (Signed)
  Pt is a 30 yr old female that is appropriate in affect and mood. Pt presents with concerns of medication stabilization. Pt states the need to continue inpatient treatment until she is absence of non-desired side effects. Pt has been started on 12.5mg  of thorazine for her new qid schedule.Pt was under the assumption that she would be given 25mg  BID, pt actual prescribed scheduled was discussed with reasoning of the divided doses to decrease her previous daily dosage amount. Pt understood the purpose and took meds as prescribed. Pt's lidocaine patch was secured with medipore tape. Comfort and avialiabitly as needed was extended to the patient. Pt safety remains with q4min checks.

## 2011-05-25 NOTE — Progress Notes (Signed)
  Patient seen today. Review her medication she is taking multiple psychotropic medications she continued to endorse anxiety poor sleep and sedation. She still concerned about her sleep despite lowering Thorazine she still had some sedation. She is concerned about her court date is she facing multiple felony charges however her next court date has been further delayed. As mentioned before she is taking Seroquel Thorazine Remeron to box and Prozac Tegretol and trazodone. Despite sedation she storing these medication outside. She denies any active suicidal thinking or homicidal thinking but endorse decreased energy and sometimes feelings of hopelessness. There were no paranoia or psychotic symptoms present. Her Tegretol has been increased yesterday to 4 times a day. Assessment mood disorder NOS, Plan we'll continue the Tegretol 4 times a day she is taking 200 mg. I will discontinue her Seroquel as she's already taking Thorazine in divided doses. I will get Tegretol level. Patient is still feel that she can leave on Monday.

## 2011-05-25 NOTE — Progress Notes (Signed)
[facility] Group Notes: (Counselor/Nursing/MHT/Case Management/Adjunct)  05/25/2011 1:15PM   Type of Therapy: Counseling / Dance/Movement Therapy  Participation Level: Active  Participation Quality: Appropriate  Affect: Appropriate  Cognitive: Appropriate  Insight: Limited  Engagement in Group: Good  Engagement in Therapy: Good  Modes of Intervention: Activity, Clarification, Education, Socialization and Support   Summary of Progress/Problems:  Group members shared healthy and unhealthy ways of coping when feeling stuck, as well as how it applies to self-sabatoging and enabling in process of recovery from addiction. Pt. Stated that listening to music and playing with her dogs is a healthier coping mechanism than using drugs, which is unhealthy. Group also discussed a pattern of recovery that structured, linear, and sequential. Pt. Had difficulty remaining awake for duration for group.Pt was active and insightful.  Collins, Rayni  05/25/2011, 3:19 PM

## 2011-05-25 NOTE — Progress Notes (Signed)
[facility] Group Notes:  (Counselor/Nursing/MHT/Case Management/Adjunct)  05/25/2011 7:38 PM  Type of Therapy:  Psychoeducational Skills  Participation Level:  Active  Participation Quality:  Appropriate, Attentive, Sharing and Supportive  Affect:  Appropriate  Cognitive:  Alert, Appropriate and Oriented  Insight:  Good  Engagement in Group:  Good  Engagement in Therapy:  n/a  Modes of Intervention:  Activity, Education, Problem-solving and Support  Summary of Progress/Problems: Group focused on communication. Marleny arrived late to group but was able to  discuss why communication is important, communication do's and don'ts, and reviewed "I feel" statements. Group read an example letter out loud using "I feel" statements. Marta was given assignment to dissect a personal conflict and how communication went wrong, and what they could change to improve it.   Wandra Scot 05/25/2011, 7:38 PM

## 2011-05-25 NOTE — Progress Notes (Signed)
  Pt only complaint today has been back pain. She was given ibuprofen and it was effective. He inventory sheet stated her sleep was poor, appetite improving, energy low, attention improving, depression 7 and hopelessness 5. She stated her w/d symptoms are cravings, chilling and agitation. Denied any suicide ideation. She has participated in groups and her actions are incongruent with what she reported on her inventory sheet. RN will monitor and q 15 min cks continue.

## 2011-05-25 NOTE — Progress Notes (Signed)
Pt. attended and participated in aftercare planning group. Pt. Stated that she already has information on suicide prevention, warning signs to look for with suicide and crisis line numbers to use. The pt. agreed to call crisis line numbers if having warning signs or having thoughts of suicide. Pt. listed their current anxiety level as a 8 due to only having 4-5 hrs of sleep and being worried about her children. She plans to D/C to [facility] on Monday and states that they will provide her transportation that has been prearranged. Pt has a back up with her father to provide transportation if needed.

## 2011-05-26 MED ORDER — CARBAMAZEPINE 200 MG PO TABS
200.0000 mg | ORAL_TABLET | Freq: Two times a day (BID) | ORAL | Status: DC
  Administered 2011-05-26 – 2011-05-28 (×6): 200 mg via ORAL
  Filled 2011-05-26 (×3): qty 1
  Filled 2011-05-26 (×2): qty 28
  Filled 2011-05-26 (×4): qty 1

## 2011-05-26 NOTE — Progress Notes (Signed)
[facility] Group Notes:  (Counselor/Nursing/MHT/Case Management/Adjunct)  05/26/2011 8:51 PM  Type of Therapy:  Psychoeducational Skills  Participation Level:  Active  Participation Quality:  Appropriate, Attentive, Sharing and Supportive  Affect:  Appropriate  Cognitive:  Alert, Appropriate and Oriented  Insight:  Good  Engagement in Group:  Good  Engagement in Therapy:  n/a  Modes of Intervention:  Activity, Education, Problem-solving, Socialization and Support  Summary of Progress/Problems: Group focused on support systems. [name] participated in game asking questions about self and peers. Group discussed who their support people/systems are and what they offer. Group was given handout and discussed how quality time can be used to maintain healthy support relationships. [name] was given homework assignment to evaluate a current relationship and create an action plan to improve it.   Wandra Scot 05/26/2011, 8:51 PM

## 2011-05-26 NOTE — Progress Notes (Signed)
Pt up and appropriate within the milieu, no complaints voiced.  Pt questioned whether any further Tegretol levels would be drawn, not presently ordered.  Pt has participated well in groups today.  Denies SI/HI/hallucinations.  States ready to go home tomorrow.   Support and encouragement offered, encouraged to speak with doctor about Tegretol level.  Pt remains in dayroom interacting pleasantly with peers.

## 2011-05-26 NOTE — Progress Notes (Signed)
  Patient seen today and chart reviewed. She endorse difficulty in concentration unsteady gait and at times seeing double which could be du to high Tegretol. She is taking Tegretol 200 times a day which is prescribed by Dr. Dan Humphreys. She has difficulty adjusting that does and feel to tired and could not concentrate very well. we are still waiting for Tegretol level but we discussed that we can reduce the dose to 200 mg twice a day. She is off from Seroquel but she still take Thorazine Remeron Minipress trazodone Prozac and Robaxin. She denies any suicidal thoughts but endorse nervousness and sometimes feel restless. Mental status examination patient appears tired but appropriate. She denies any active or passive suicidal thinking homicidal thinking. There were no psychotic symptoms present. She has poor attention and poor concentration. She is alert and oriented x3. Plan we will reduce her Tegretol to twice a day so that she can tolerate the does very well we will follow up on Tegretol level she may require more days in the hospital for stabilization. I encourage her to participate in group milieu therapy. I explained the side effects and benefits of medication.

## 2011-05-26 NOTE — Progress Notes (Signed)
[facility] Group Notes:  (Counselor/Nursing/MHT/Case Management/Adjunct)  05/26/2011 1:15PM  Type of Therapy:  Counseling Group / Dance/Movement Therapy  Participation Level:  Minimal  Participation Quality:  Drowsy, Redirectable, Sharing and Supportive  Affect:  Appropriate  Cognitive:  Oriented  Insight:  Limited  Engagement in Group:  Limited  Engagement in Therapy:  Limited  Modes of Intervention:  Clarification, Education, Problem-solving, Socialization and Support  Summary of Progress/Problems: Group discussed sources of support in their lives and the difference between what makes supports healthy or unhealthy, as well as how to identify them. Pt. shared that her children give support by being happy and "flawless." Group also focused setting safe boundaries with themselves and with others when encountering triggers by saying "no" or "stop". Throughout the session, pt. would fall asleep, but she would wake up when addressed directly.    Stephaniemarie Stoffel 05/26/2011, 3:30 PM

## 2011-05-26 NOTE — Progress Notes (Addendum)
  05-26-11  Pt had no complaints this am. She took all her am meds. On her inventory sheet she wrote sleep poor, appetite improving, energy normal, attention good, depression 7 and hopelessness at 6. She continues to having craving for her substance of choice. She wrote passive is but was able to contract. She having some physical problems double vision and nightmares. She attributed this to her meds and was told to discuss this with the md. RN will monitor and safety checks continue every 15 minutes.

## 2011-05-27 MED ORDER — CHLORPROMAZINE HCL 25 MG PO TABS: 12.5000 mg | ORAL_TABLET | Freq: Every day | ORAL | Status: DC | PRN

## 2011-05-27 MED ORDER — CHLORPROMAZINE HCL 25 MG PO TABS
12.5000 mg | ORAL_TABLET | Freq: Three times a day (TID) | ORAL | Status: DC
  Administered 2011-05-27 – 2011-05-28 (×3): 12.5 mg via ORAL
  Administered 2011-05-28: 10:00:00 via ORAL
  Administered 2011-05-28: 12.5 mg via ORAL
  Filled 2011-05-27 (×3): qty 1
  Filled 2011-05-27 (×3): qty 49

## 2011-05-27 MED ORDER — CHLORPROMAZINE HCL 50 MG PO TABS
50.0000 mg | ORAL_TABLET | Freq: Every day | ORAL | Status: DC
  Administered 2011-05-27: 50 mg via ORAL
  Filled 2011-05-27 (×2): qty 1

## 2011-05-27 NOTE — Progress Notes (Signed)
[facility] Group Notes:  (Counselor/Nursing/MHT/Case Management/Adjunct)  05/27/2011 2:43 PM  Type of Therapy:  group therapy  Participation Level:  Active  Participation Quality:  Redirectable  Affect:  Irritable  Cognitive:  Alert  Insight:  Limited  Engagement in Group:  Good  Engagement in Therapy:  Limited  Modes of Intervention:  Problem-solving, Support and exploration  Summary of Progress/Problems: Pt was emotional stating she can only take things one day at a time and looking at barriers for when she leaves is too much. Pt later calmed down and was able to share that she is her biggest barrier.Pt shared her feelings about loosing her children and driving a wedge between her and her father.   [name] 05/27/2011, 2:43 PM

## 2011-05-27 NOTE — Progress Notes (Signed)
  Pt. Rates her sleep as fair and her Depression and Hopelessness as 7/10. She denies SI/H/I and A/V hallucinations and is med compliant.She participates in groups and interacts well  With peers and staff. Encouraged and supported.

## 2011-05-27 NOTE — Progress Notes (Signed)
Pt to d/c today.  No beds at [facility], so as discussed, pt will go stay with father and attend AA/NA mtgs.  Assessment at [facility] rehab on 11/20 at 8AM.  Sent with scripts, meds.  All inpt goals met.

## 2011-05-27 NOTE — Progress Notes (Signed)
[facility] Group Notes:  (Counselor/Nursing/MHT/Case Management/Adjunct)  05/27/2011 4:48 PM  Type of Therapy:  Group Therapy  Participation Level:  Active  Participation Quality:  Appropriate and Sharing  Affect:  Labile  Cognitive:  Alert  Insight:  Good  Engagement in Group:  Good  Engagement in Therapy:  Good  Modes of Intervention:  Problem-solving, Role-play and Support  Summary of Progress/Problems: Pt was able to state that in order to change her life and overcome barriers she needs to rearrange her entire life- from the people she talks to, her thoughts, hobbies and so on. 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 Pt able to provide helpful feedback to others.   [name] 05/27/2011, 4:48 PM

## 2011-05-27 NOTE — Progress Notes (Addendum)
Pt in dayroom, resting with eyes closed in chair. Opened eyes spontaneously to name. States she has had a bad day. When asked to qualify bad day, states she is still upset about her daughter stating that she was glad her mommy wasn't around. Support and encouragement provided. States she otherwise doing ok and is anticipating DC soon. Plan to go back to her fathers and go to [employer]. Otherwise no questions or concerns. Denies any pain. Denies SI/HI/AVH and contracts for safety. Reviewed medications and POC for the shift and understanding verbalized. Safety has been maintained with Q11minute observations. Will continue current POC.  Pt approached Clinical research associate to provide additional information. Was able to talk with her daughter but states she didn't want to discuss her comments about not wanting her around r/t daughter having a particularly good day. States she is hopeful to attend [facility] and f/u with [facility] on the 20th of November. States she feels like she is extremely motivated to continue her treatment and sobriety r/t the hope that her legal charges will be dropped if she does well. Support and encouragement provided. Will continue current POC.

## 2011-05-27 NOTE — Progress Notes (Addendum)
Reviewed notes from all professionals since I saw patient last week.  Patient seen in Discharge planning group where she reported that she continued to have nightmares on the Minipress.  Will stop the Minipress. She reported that her focus and ability to concentrate were best on the lower dose of Tegretol.  She reported that she got her blood drawn on Sat PM.  The lab reported that the Tegretol level ordered was FREE Carbamazepine and that tests takes 3 days to get the results.  The Tegretol was adjusted to 200 mg BID with fairly good results in mental focus.  After group taught patient and a few others how to use some breathing techniques to manage her anxiety.  She was so impressed with how this worked for her that she taught this to other in the next group that she attended.   The patient also seen in the consult office where she was tearful and upset that she was just handed discharge papers and was reflecting on the fact that her daughter had said to the daughters' father that the daughter wishes that "mommy wasn't here."  The female parent who heard this statement tried to clarify that with the daughter, but was unable to do so.    Reviewed the whole medication picture and the relative effects that each medication has on the mental fogginess, anxiety, depression, and insomnia.  It was decided that we will slightly increase the Thorazine to 50 mg at HS and keep the 12.5 mg doses at 4 or 5 during the day.  If she gets better sleep then it is thought that she might do better with the anxiety and depression that is still bothering her.   Will not discharge today and will see how she does with this next version of her medication regimen.

## 2011-05-28 MED ORDER — TRAZODONE HCL 50 MG PO TABS
150.0000 mg | ORAL_TABLET | Freq: Every day | ORAL | Status: DC
  Filled 2011-05-28: qty 42

## 2011-05-28 MED ORDER — RAMELTEON 8 MG PO TABS: 8.0000 mg | ORAL_TABLET | Freq: Every day | ORAL | Status: DC

## 2011-05-28 MED ORDER — PANTOPRAZOLE SODIUM 40 MG PO TBEC: 40.0000 mg | DELAYED_RELEASE_TABLET | Freq: Two times a day (BID) | ORAL | Status: DC

## 2011-05-28 MED ORDER — TRAZODONE HCL 150 MG PO TABS: 150.0000 mg | ORAL_TABLET | Freq: Every day | ORAL | Status: AC

## 2011-05-28 MED ORDER — IBUPROFEN 800 MG PO TABS: 800.0000 mg | ORAL_TABLET | Freq: Four times a day (QID) | ORAL | Status: AC | PRN

## 2011-05-28 MED ORDER — CARBAMAZEPINE 200 MG PO TABS: 200.0000 mg | ORAL_TABLET | Freq: Two times a day (BID) | ORAL | Status: DC

## 2011-05-28 MED ORDER — CHLORPROMAZINE HCL 50 MG PO TABS: 50.0000 mg | ORAL_TABLET | Freq: Every day | ORAL | Status: AC

## 2011-05-28 MED ORDER — LIDOCAINE 5 % EX PTCH: 1.0000 | MEDICATED_PATCH | Freq: Every day | CUTANEOUS | Status: AC

## 2011-05-28 MED ORDER — FLUOXETINE HCL 20 MG PO CAPS: 20.0000 mg | ORAL_CAPSULE | Freq: Every day | ORAL | Status: DC

## 2011-05-28 MED ORDER — DICLOFENAC SODIUM 1 % TD GEL: 1.0000 "application " | Freq: Four times a day (QID) | TRANSDERMAL | Status: DC | PRN

## 2011-05-28 MED ORDER — CHLORPROMAZINE HCL 25 MG PO TABS: 12.5000 mg | ORAL_TABLET | Freq: Three times a day (TID) | ORAL | Status: AC

## 2011-05-28 MED ORDER — METHOCARBAMOL 500 MG PO TABS: 1000.0000 mg | ORAL_TABLET | Freq: Three times a day (TID) | ORAL | Status: AC

## 2011-05-28 NOTE — Progress Notes (Signed)
Patient discharged to her fathers home.  She was escorted off the unit to the lobby where her father was waiting to pick her up.  All belongings returned and signed for.

## 2011-05-28 NOTE — Progress Notes (Signed)
See previous d/c note from 11/12.

## 2011-05-28 NOTE — Discharge Summary (Signed)
Discharge Note  Date of Admission:  05/15/2011  Date of Discharge:  05/28/2011  Is patient on multiple antipsychotic therapies at discharge:  No    Patient phone:  [phone] (home)  Patient address:   [address] [place] [address],    Follow-up recommendations:    Comments:    The patient received suicide prevention pamphlet:  No Belongings returned:  Judene Companion, Fleta Borgeson 05/28/2011, 2:56 PM

## 2011-05-28 NOTE — Progress Notes (Signed)
[facility] Group Notes:  (Counselor/Nursing/MHT/Case Management/Adjunct)  05/28/2011 2:47 PM  Type of Therapy:  Group therapy  Participation Level:  Active  Participation Quality:  Appropriate  Affect:  Appropriate  Cognitive:  Appropriate  Insight:  Good  Engagement in Group:  Good  Engagement in Therapy:  Good  Modes of Intervention:  Problem-solving, Support and exploration  Summary of Progress/Problems: Pt states that she is aware of her dx of head trauma, PTSD, addiction and depression and often feels overwhelmed and overpowered, pt feels a great deal of anxiety about leaving as she is fearful of the unknown. Pt states she wants to be productive in society and be Katiya the RN and not Marlyn the addict.    [name] 05/28/2011, 2:47 PM

## 2011-05-28 NOTE — Discharge Summary (Signed)
Physician Discharge Summary  Patient ID: [name] MRN: [id number] DOB/AGE: [birth year] 30 y.o.  Admit date: 05/15/2011 Discharge date: 05/28/2011  Discharge Diagnoses:  Principal Problem:  *Heroin dependence Active Problems:  Mood disorder due to a general medical condition  Chronic traumatic encephalopathy  Axis I #1 heroin dependence #2 mood disorder due to general medical condition Axis II deferred Axis III chronic traumatic encephalopathy Axis IV moderate psychosocial issues related to substance use Axis V 55  Discharged Condition:  Patient was admitted placed on a clonidine protocol for heroin withdrawal. She had a very rough time with that. She describes a head injury that caused her great difficulty with emotions and cloudy thinking.  This seemed consistent with chronic traumatic encephalopathy.  She was started on Tegretol.  She got toxic on that and got double vision.  When that was restarted at a lower dose, she did quite well on 200 mg TID.  She noted improved concentration and clearer thinking and chose to continue the Tegretol.  She noted anxiety that got better on Thorazine.  She initially stated that she had a high tolerance for medications and was started on 50 mg TID that was lowered to 25 mg TID and then finally 12.5 mg TID with good results.  The case manager was never convinced that she was ready to stop all substances and that she was still trying to use medications to solve her problems.  She was cooperative with the case manager and agreed to go to [facility] on Nov 20th.    Discharge Exam: Blood pressure 147/93, pulse 84, temperature 98.5 F (36.9 C), temperature source Oral, resp. rate 22, height 5\' 10"  (1.778 m), weight 74.38 kg (163 lb 15.7 oz).  Patient denies suicidal or homicidal ideation, hallucinations, illusions, or delusions. Patient engages with good eye contact, is able to focus adequately in a one to one setting, and has clear goal directed  thoughts. Patient speaks with a natural conversational volume, rate, and tone. Anxiety was reported at 1 on a scale of 1 the least and 10 the most. Depression was reported at 6 on the same scale. Patient is oriented times 4, recent and remote memory intact. Judgement: Improved from admission Insight: Learned that she was "sick as hell when I got here and I never want to do that again.  I need to utilize my tools to stay clean.  You can't be a dressed up garbage can." Plan:go to [facility] on Nov 20th after she stays at her father's house  Disposition: [facility]  Current Discharge Medication List    START taking these medications   Details  carbamazepine (TEGRETOL) 200 MG tablet Take 1 tablet (200 mg total) by mouth 2 times daily at 12 noon and 4 pm. Qty: 30 tablet, Refills: 0    !! chlorproMAZINE (THORAZINE) 25 MG tablet Take 0.5 tablets (12.5 mg total) by mouth 3 (three) times daily. Qty: 45 tablet, Refills: 0    !! chlorproMAZINE (THORAZINE) 50 MG tablet Take 1 tablet (50 mg total) by mouth at bedtime. Qty: 30 tablet, Refills: 0    diclofenac sodium (VOLTAREN) 1 % GEL Apply 1 application topically 4 (four) times daily as needed (as needed for pain). Qty: 1 Tube, Refills: 0    FLUoxetine (PROZAC) 20 MG capsule Take 1 capsule (20 mg total) by mouth daily before supper. Qty: 30 capsule, Refills: 0    ibuprofen (ADVIL,MOTRIN) 800 MG tablet Take 1 tablet (800 mg total) by mouth every 6 (six) hours as  needed for pain. Qty: 90 tablet, Refills: 0    lidocaine (LIDODERM) 5 % Place 1 patch onto the skin daily. Remove & Discard patch within 12 hours or as directed by MD Qty: 30 patch, Refills: 0    methocarbamol (ROBAXIN) 500 MG tablet Take 2 tablets (1,000 mg total) by mouth 3 (three) times daily. Qty: 90 tablet, Refills: 0    ramelteon (ROZEREM) 8 MG tablet Take 1 tablet (8 mg total) by mouth at bedtime. Qty: 30 tablet, Refills: 0    traZODone (DESYREL) 150 MG tablet Take 1 tablet (150  mg total) by mouth at bedtime. Qty: 30 tablet, Refills: 0     !! - Potential duplicate medications found. Please discuss with provider.    CONTINUE these medications which have NOT CHANGED   Details  lansoprazole (PREVACID) 30 MG capsule Take 30-60 mg by mouth daily. For acid reflux        Follow-up Information    Follow up with [facility]        [facility] on 06/04/2011. (8:00 AM)    Contact information:   [address]  [facility] [phone]         Signed: Orson Aloe 05/28/2011, 2:57 PM

## 2011-05-28 NOTE — Progress Notes (Signed)
Patient to be discharged today.  All discharge instructions, medications, and follow up care reviewed.  Patient verbalizes understanding of all.  She will be going to live with her father until she can get to the [facility] on 04 Jun 2011.  Suicide risk assessment is complete, patient denies suicidal ideation at this time.  Patient is ready to leave and follow up with her after care program.

## 2011-05-28 NOTE — Progress Notes (Signed)
Recreation Therapy Group Note  Date: 05/28/2011         Time: 1000       Group Topic/Focus: Patient invited to participate in animal assisted therapy. Pets as a coping skill and responsibility were discussed.   Participation Level: Active  Participation Quality: Appropriate and Attentive  Affect: Excited  Cognitive: Appropriate and Oriented   Additional Comments: Patient bright when dog is present, reports she is looking forward to discharge today.

## 2011-05-28 NOTE — Progress Notes (Addendum)
Suicide Risk Assessment  Discharge Assessment     Demographic factors:    Current Mental Status:    Risk Reduction Factors:     CLINICAL FACTORS:   Depression:   Comorbid alcohol abuse/dependence Alcohol/Substance Abuse/Dependencies Chronic Pain Previous Psychiatric Diagnoses and Treatments  COGNITIVE FEATURES THAT CONTRIBUTE TO RISK:  No cognitive risk features noted    SUICIDE RISK:   Minimal: No identifiable suicidal ideation.  Patients presenting with no risk factors but with morbid ruminations; may be classified as minimal risk based on the severity of the depressive symptoms  Patient denies suicidal or homicidal ideation, hallucinations, illusions, or delusions. Patient engages with good eye contact, is able to focus adequately in a one to one setting, and has clear goal directed thoughts. Patient speaks with a natural conversational volume, rate, and tone. Anxiety was reported at 1 on a scale of 1 the least and 10 the most. Depression was reported at 6 on the same scale. Patient is oriented times 4, recent and remote memory intact. Judgement: Improved from admission Insight: Learned that she was "sick as hell when I got here and I never want to do that again.  I need to utilize my tools to stay clean.  You can't be a dressed up garbage can." Plan:go to [facility] on Nov 20th after she stays at her father's house  [name], [name] 05/28/2011, 10:03 AM

## 2011-05-28 NOTE — Assessment & Plan Note (Signed)
[name], [name]              ACCOUNT NO.:  [id number]  MEDICAL RECORD NO.:  [id number]  LOCATION:  0301                          FACILITY:  [facility]  PHYSICIAN:  Orson Aloe, MD       DATE OF BIRTH:  02-15-1981  DATE OF ADMISSION:  05/15/2011 DATE OF DISCHARGE:                      PSYCHIATRIC ADMISSION ASSESSMENT   DATE OF ASSESSMENT:  May 16, 2011, at 13:30.  IDENTIFYING INFORMATION:  This is a 30 year old single female.  This is a voluntary admission.  HISTORY OF THE PRESENT ILLNESS:  Pascha has presented requesting detox from opiates.  She says that her habit has been getting worse since the last time she was here at [facility] in March of 2012.  Her children have been taken away from her.  She has some legal charges against her and wants to get clean and admits that she has been developing some anger issues. She has had thoughts of killing herself, but she denies any suicidal thoughts today.  She has had some Librium and clonidine as part of her detox protocol.  She is sleepy today and unable to give much history.  PAST PSYCHIATRIC HISTORY:  Previous [facility] admissions in March and 2012 and in 2011.  She has reported that she has attended multiple drug rehab programs in the past.  She has previously had an uneventful detox on clonidine.  Denies previous suicide attempts.  Receiving no outpatient treatment at this time.  SOCIAL HISTORY:  She is the mother of 2 young children and reports she recently lost custody of her daughter.  She has a poor relationship with her mother who has been blaming her for losing custody of the children. She says that if she cannot get custody back and get, she had thoughts of killing herself and has been carrying a knife.  She has reported recent felony charges.  MEDICAL HISTORY: 1. No regular primary care provider. 2. Medical problems:  She has no history of significant underlying     medical conditions. 3. Past medical history:  Positive for  urinary tract infections. 4. Current medications:  Prevacid 20 mg daily as needed for acid     reflux.  Does not take it regularly. 5. Drug allergies:  None.  MEDICAL EVALUATION:  She has been examined here on the unit, and our findings are consistent with the report given in the emergency room. Please see the transcript there.  This is a slim-built Caucasian female in no acute distress.  She is 5 feet 10 inches tall, 74 kg.  REVIEW OF SYSTEMS:  She is complaining of some muscle aches, mild nausea.  She received Zofran in the emergency room and has been started on withdrawal protocol here.  She is currently drowsy and in no distress.  Admitting vital signs:  Temp 98.8, pulse 73, respirations 16, blood pressure 134/78.  DIAGNOSTIC STUDIES:  Done in the emergency room.  CBC:  WBC 6.0, hemoglobin 11.3, hematocrit 34.0, platelets 291,000.  Chemistry is normal.  BUN 11, creatinine 0.53.  Urine pregnancy test is negative. Alcohol screen negative.  Urine drug screen positive for opiates. Routine urinalysis is unremarkable and negative for protein, blood, ketones and leukocytes.  MENTAL STATUS EXAM:  This  is a drowsy female who answers readily to her name but declines interview.  Denies any suicidal thoughts today.  Has little to offer.  Asks to go back to sleep.  Resistant to interview. She reports that she is very upset at losing custody of her children, and her suicidal thoughts revolve around the possibility of not being able to get them back.  Cognitively, she is completely intact.  No delirium or confusion.  Thinking is nonpsychotic.  Axis I:  Major depression, opiate abuse and dependence, history of cocaine abuse. Axis II:  Deferred. Axis III:  No diagnosis. Axis IV.  Severe parenting and social issues. Axis V:  Current is 40.  Past year not known.  PLAN:  Voluntarily admit her with a goal of a safe detox in 5 days and to alleviate her suicidal thoughts.  We are going to  defer antidepressant medication until she makes some progress with detox and can give additional history.  Meanwhile, we started her on a clonidine and a Librium detox protocol with a goal of a safe detox in 5 days.  We will do some additional labs including checking liver enzymes.     Margaret A. Lorin Picket, N.P.   ______________________________ Orson Aloe, MD    MAS/MEDQ  D:  05/16/2011  T:  05/16/2011  Job:  [id number]  Electronically Signed by Kari Baars N.P. on 05/22/2011 08:39:37 AM Electronically Signed by Orson Aloe  on 05/28/2011 04:22:57 PM

## 2011-05-28 NOTE — Progress Notes (Signed)
[facility] Group Notes:  (Counselor/Nursing/MHT/Case Management/Adjunct)  05/28/2011 5:03 PM  Type of Therapy:  Group Therapy  Participation Level:  Active  Participation Quality:  Appropriate and Sharing  Affect:  Appropriate  Cognitive:  Appropriate  Insight:  Good  Engagement in Group:  Good  Engagement in Therapy:  Good  Modes of Intervention:  Problem-solving, Support and exploration  Summary of Progress/Problems: [name] able to state that the letter from an addict was powerful in that for the first time in three years he is no longer numb, pt states it's scary having a flood of emotions. Pt able to provide positive feedback for others.  [name] 05/28/2011, 5:03 PM

## 2011-05-29 NOTE — Progress Notes (Signed)
Patient Discharge Instructions:  Dictated admission note faxed, Date faxed:  05/29/2011 D/C instructions faxed, Date faxed:  05/29/2011 D/C Summary faxed, Date faxed:  05/29/2011 Med. Rec. Form faxed, Date faxed:  05/29/2011  Wandra Scot, 05/29/2011, 2:11 PM

## 2011-07-10 ENCOUNTER — Emergency Department (HOSPITAL_COMMUNITY)
Admission: EM | Admit: 2011-07-10 | Discharge: 2011-07-10 | Disposition: A | Discharge status: Home / Self Care | Payer: Medicaid Other | Attending: Emergency Medicine | Admitting: Emergency Medicine

## 2011-07-10 ENCOUNTER — Encounter: Payer: Self-pay | Admitting: Emergency Medicine

## 2011-07-10 DIAGNOSIS — N390 Urinary tract infection, site not specified: Secondary | ICD-10-CM | POA: Insufficient documentation

## 2011-07-10 DIAGNOSIS — R350 Frequency of micturition: Secondary | ICD-10-CM | POA: Insufficient documentation

## 2011-07-10 DIAGNOSIS — Z79899 Other long term (current) drug therapy: Secondary | ICD-10-CM | POA: Insufficient documentation

## 2011-07-10 DIAGNOSIS — R300 Dysuria: Secondary | ICD-10-CM | POA: Insufficient documentation

## 2011-07-10 LAB — URINALYSIS, ROUTINE W REFLEX MICROSCOPIC
Bilirubin Urine: NEGATIVE
Glucose, UA: NEGATIVE mg/dL
Ketones, ur: NEGATIVE mg/dL
pH: 7.5 (ref 5.0–8.0)

## 2011-07-10 LAB — URINE MICROSCOPIC-ADD ON

## 2011-07-10 MED ORDER — SULFAMETHOXAZOLE-TRIMETHOPRIM 800-160 MG PO TABS: 1.0000 | ORAL_TABLET | Freq: Two times a day (BID) | ORAL | Status: AC

## 2011-07-10 NOTE — ED Notes (Signed)
Pt states she has a UTI and knows this because she has had them before and symptoms are same

## 2011-07-10 NOTE — ED Provider Notes (Signed)
History     CSN: [id number]  Arrival date & time 07/10/11  1507   First MD Initiated Contact with Patient 07/10/11 1851      Chief Complaint  Patient presents with  . Urinary Tract Infection    (Consider location/radiation/quality/duration/timing/severity/associated sxs/prior treatment) HPI Patient presenting with complaint of urinary frequency as well as cloudy urine and burning with urination. She also has mild low back pain. She states this feels similar to her prior urinary tract infections. She denies any fever or chills, no abdominal pain, no nausea or vomiting. Symptoms began approximately one week ago. She tried to increase her fluid intake but symptoms have continued. They're described as mild and constant. There are no alleviating or modifying factors. There no systemic symptoms.  History reviewed. No pertinent past medical history.  History reviewed. No pertinent past surgical history.  History reviewed. No pertinent family history.  History  Substance Use Topics  . Smoking status: Former Games developer  . Smokeless tobacco: Not on file  . Alcohol Use: No    OB History    Grav Para Term Preterm Abortions TAB SAB Ect Mult Living                  Review of Systems  Allergies  Review of patient's allergies indicates no known allergies.  Home Medications   Current Outpatient Rx  Name Route Sig Dispense Refill  . CARBAMAZEPINE 200 MG PO TABS Oral Take 200 mg by mouth 2 times daily at 12 noon and 4 pm.      . CHLORPROMAZINE HCL 25 MG PO TABS Oral Take 25 mg by mouth 3 (three) times daily.      Marland Kitchen FLUOXETINE HCL 20 MG PO CAPS Oral Take 20 mg by mouth daily before supper.      . SULFAMETHOXAZOLE-TRIMETHOPRIM 800-160 MG PO TABS Oral Take 1 tablet by mouth 2 (two) times daily. 28 tablet 0    BP 124/76  Pulse 88  Temp(Src) 97.8 F (36.6 C) (Oral)  Resp 18  SpO2 100% Vitals reviewed Physical Exam Physical Examination: General appearance - alert, well appearing, and  in no distress Mental status - alert, oriented to person, place, and time Mouth - mucous membranes moist, pharynx normal without lesions Heart - normal rate, regular rhythm, normal S1, S2, no murmurs, rubs, clicks or gallops Abdomen - soft, nontender, nondistended, no masses or organomegaly Back- no CVA tenderness, no midline back tenderness Musculoskeletal - no joint tenderness, deformity or swelling Extremities - peripheral pulses normal, no pedal edema, no clubbing or cyanosis Skin - normal coloration and turgor, no rashes, no suspicious skin lesions noted  ED Course  Procedures (including critical care time)  Labs Reviewed  URINALYSIS, ROUTINE W REFLEX MICROSCOPIC - Abnormal; Notable for the following:    Leukocytes, UA SMALL (*)    All other components within normal limits  URINE MICROSCOPIC-ADD ON - Abnormal; Notable for the following:    Bacteria, UA MANY (*)    All other components within normal limits  PREGNANCY, URINE   No results found.   1. Urinary tract infection       MDM  Patient with history of multiple urinary tract infections presents with dysuria and mild low back pain. Her urine does show many bacteria and small leukocyte esterase. Based on prior culture results of her urine which were resistant to ampicillin as well as Cipro I elected to treat her with Bactrim. She last had antibiotics proximally 9 months ago. A urine culture  was sent in case of treatment failure with Bactrim. She is nontoxic and well-hydrated appearing. She was discharged with strict return precautions and was agreeable with the plan.        Ethelda Chick, MD 07/10/11 2022

## 2011-07-10 NOTE — ED Notes (Signed)
Pt c/o frequency to urinate and lower back pain x several days; pt sts hx of UTI in past and feels same; pt denies other complaint

## 2011-08-25 ENCOUNTER — Encounter (HOSPITAL_COMMUNITY): Payer: Self-pay | Admitting: *Deleted

## 2011-08-25 ENCOUNTER — Emergency Department (HOSPITAL_COMMUNITY)
Admission: EM | Admit: 2011-08-25 | Discharge: 2011-08-25 | Disposition: A | Discharge status: Home / Self Care | Payer: Medicaid Other | Attending: Emergency Medicine | Admitting: Emergency Medicine

## 2011-08-25 DIAGNOSIS — M533 Sacrococcygeal disorders, not elsewhere classified: Secondary | ICD-10-CM | POA: Insufficient documentation

## 2011-08-25 DIAGNOSIS — J029 Acute pharyngitis, unspecified: Secondary | ICD-10-CM

## 2011-08-25 DIAGNOSIS — M79609 Pain in unspecified limb: Secondary | ICD-10-CM | POA: Insufficient documentation

## 2011-08-25 HISTORY — DX: Reserved for concepts with insufficient information to code with codable children: IMO0002

## 2011-08-25 LAB — URINALYSIS, ROUTINE W REFLEX MICROSCOPIC
Leukocytes, UA: NEGATIVE
Nitrite: NEGATIVE
Specific Gravity, Urine: 1.015 (ref 1.005–1.030)
Urobilinogen, UA: 0.2 mg/dL (ref 0.0–1.0)

## 2011-08-25 LAB — PREGNANCY, URINE: Preg Test, Ur: NEGATIVE

## 2011-08-25 MED ORDER — PENICILLIN G BENZATHINE 1200000 UNIT/2ML IM SUSP
1.2000 10*6.[IU] | Freq: Once | INTRAMUSCULAR | Status: AC
  Administered 2011-08-25: 1.2 10*6.[IU] via INTRAMUSCULAR
  Filled 2011-08-25: qty 2

## 2011-08-25 MED ORDER — OXYCODONE-ACETAMINOPHEN 5-325 MG PO TABS
2.0000 | ORAL_TABLET | Freq: Once | ORAL | Status: AC
  Administered 2011-08-25: 2 via ORAL
  Filled 2011-08-25: qty 2

## 2011-08-25 MED ORDER — KETOROLAC TROMETHAMINE 60 MG/2ML IM SOLN
60.0000 mg | Freq: Once | INTRAMUSCULAR | Status: AC
  Administered 2011-08-25: 60 mg via INTRAMUSCULAR
  Filled 2011-08-25: qty 2

## 2011-08-25 MED ORDER — OXYCODONE-ACETAMINOPHEN 5-325 MG PO TABS: 2.0000 | ORAL_TABLET | Freq: Four times a day (QID) | ORAL | Status: AC | PRN

## 2011-08-25 NOTE — ED Provider Notes (Signed)
History     CSN: [id number]  Arrival date & time 08/25/11  1001   First MD Initiated Contact with Patient 08/25/11 1055      Chief Complaint  Patient presents with  . Back Pain  . Sore Throat    (Consider location/radiation/quality/duration/timing/severity/associated sxs/prior treatment) HPI Patient is a 31 year old female who presents today complaining of excruciating back pain over the past month it has gotten more severe over the past 3 days. She indicates the pain is in the sacroiliac joints bilaterally and radiates down the posterior aspect of both legs. Patient denies any incontinence or so when a seizure. She denies any injuries or trauma. Patient has history of a herniated disc several years ago. She states that this feels quite different. She reports that her pain is a 10 out of 10 and the only thing that she can recall feeling like this before was when she was giving birth to her son. She denies any abdominal pain, nausea, vomiting, fevers, vaginal discharge, or abnormalities in her period. Patient has tried exercises she had learned in physical therapy previously as well as ibuprofen and Flexeril at home. She's had no relief. Patient states that she is unable to sit still because of that sensation. Doing her stretches his actions seem to make the pain worse.There are no other associated or modifying factors.  Past Medical History  Diagnosis Date  . Herniated disc     History reviewed. No pertinent past surgical history.  History reviewed. No pertinent family history.  History  Substance Use Topics  . Smoking status: Former Games developer  . Smokeless tobacco: Not on file  . Alcohol Use: No    OB History    Grav Para Term Preterm Abortions TAB SAB Ect Mult Living                  Review of Systems  Constitutional: Negative.   HENT: Negative.   Eyes: Negative.   Respiratory: Negative.   Cardiovascular: Negative.   Gastrointestinal: Negative.   Genitourinary: Negative.    Musculoskeletal: Positive for back pain.  Skin: Negative.   Neurological: Negative.   Hematological: Negative.   Psychiatric/Behavioral: Negative.   All other systems reviewed and are negative.    Allergies  Review of patient's allergies indicates no known allergies.  Home Medications  No current outpatient prescriptions on file.  BP 142/90  Pulse 93  Temp(Src) 98.4 F (36.9 C) (Oral)  Resp 18  SpO2 100%  LMP 08/25/2011  Physical Exam  Nursing note and vitals reviewed. GEN: Well-developed, well-nourished female in no distress. Appears uncomfortable HEENT: Atraumatic, normocephalic. Oropharynx clear without erythema EYES: PERRLA BL, no scleral icterus. NECK: Trachea midline, no meningismus CV: regular rate and rhythm. No murmurs, rubs, or gallops PULM: No respiratory distress.  No crackles, wheezes, or rales. GI: soft, non-tender. No guarding, rebound, or tenderness. + bowel sounds  Neuro: cranial nerves 2-12 intact, no abnormalities of strength or sensation, A and O x 3 MSK: Patient moves all 4 extremities symmetrically, no deformity, edema, or injury noted. Patient has tenderness to palpation over the bilateral sacroiliac joints. She sits with her back flexed as well as her knees. Psych: no abnormality of mood   ED Course  Procedures (including critical care time)   Labs Reviewed  URINALYSIS, ROUTINE W REFLEX MICROSCOPIC  PREGNANCY, URINE   No results found.   1. Pharyngitis   2. Sacroiliac joint pain       MDM  Patient was evaluated by myself.  Based on her symptoms patient was thought to most likely had irritation of the sacroiliac joints. She complains of radiation of the pain as well but this appears to be more for hamstring tightness. Patient was treated with ice as well as 2 Percocet here. Urinalysis was performed as well as urine pregnancy given patient had described her pain as being like labor and she had a urinary tract infection last month. She  denies any urinary symptoms.  Patient does have history of pharyngitis and asked me to take a look at her throat. She did have exudates noted with erythema. Patient did range of motion of the neck. She was given a shot of Bicillin. Patient was given discharge instructions. These included instructions regarding pharyngitis, saltwater gargle, as well as sacroiliac dysfunction. She was feeling much better and was discharged with a prescription for Percocet. She is to continue this along with her other medications that she's been using at home. Urine results were unremarkable.        Cyndra Numbers, MD 08/25/11 1352

## 2011-08-25 NOTE — ED Notes (Signed)
Reports lower back pain x 1 month that has become more severe past 3 days, radiates down back of legs, ambulatory at triage, reports also having sore throat, airway intact.

## 2012-02-16 ENCOUNTER — Emergency Department (HOSPITAL_COMMUNITY): Payer: Self-pay

## 2012-02-16 ENCOUNTER — Emergency Department (HOSPITAL_COMMUNITY)
Admission: EM | Admit: 2012-02-16 | Discharge: 2012-02-16 | Disposition: A | Discharge status: Home / Self Care | Payer: Self-pay | Attending: Emergency Medicine | Admitting: Emergency Medicine

## 2012-02-16 ENCOUNTER — Encounter (HOSPITAL_COMMUNITY): Payer: Self-pay | Admitting: Emergency Medicine

## 2012-02-16 DIAGNOSIS — S43005A Unspecified dislocation of left shoulder joint, initial encounter: Secondary | ICD-10-CM

## 2012-02-16 DIAGNOSIS — Y92009 Unspecified place in unspecified non-institutional (private) residence as the place of occurrence of the external cause: Secondary | ICD-10-CM | POA: Insufficient documentation

## 2012-02-16 DIAGNOSIS — X58XXXA Exposure to other specified factors, initial encounter: Secondary | ICD-10-CM | POA: Insufficient documentation

## 2012-02-16 DIAGNOSIS — Z87891 Personal history of nicotine dependence: Secondary | ICD-10-CM | POA: Insufficient documentation

## 2012-02-16 DIAGNOSIS — S43006A Unspecified dislocation of unspecified shoulder joint, initial encounter: Secondary | ICD-10-CM | POA: Insufficient documentation

## 2012-02-16 HISTORY — DX: Recurrent dislocation, unspecified shoulder: M24.419

## 2012-02-16 MED ORDER — ETOMIDATE 2 MG/ML IV SOLN: 10.0000 mg | Freq: Once | INTRAVENOUS | Status: DC

## 2012-02-16 MED ORDER — ETOMIDATE 2 MG/ML IV SOLN
INTRAVENOUS | Status: AC | PRN
  Administered 2012-02-16: 10 mg via INTRAVENOUS

## 2012-02-16 MED ORDER — MIDAZOLAM HCL 2 MG/2ML IJ SOLN
INTRAMUSCULAR | Status: AC
  Filled 2012-02-16: qty 2

## 2012-02-16 MED ORDER — MIDAZOLAM HCL 2 MG/2ML IJ SOLN: 2.0000 mg | Freq: Once | INTRAMUSCULAR | Status: DC

## 2012-02-16 MED ORDER — MELOXICAM 7.5 MG PO TABS: 15.0000 mg | ORAL_TABLET | Freq: Every day | ORAL | Status: DC

## 2012-02-16 MED ORDER — FENTANYL CITRATE 0.05 MG/ML IJ SOLN
INTRAMUSCULAR | Status: AC
  Administered 2012-02-16: 100 ug via INTRAVENOUS
  Filled 2012-02-16: qty 2

## 2012-02-16 MED ORDER — MIDAZOLAM HCL 5 MG/5ML IJ SOLN
INTRAMUSCULAR | Status: AC | PRN
  Administered 2012-02-16: 2 mg via INTRAVENOUS

## 2012-02-16 MED ORDER — FENTANYL CITRATE 0.05 MG/ML IJ SOLN
100.0000 ug | Freq: Once | INTRAMUSCULAR | Status: AC
  Administered 2012-02-16: 100 ug via INTRAVENOUS

## 2012-02-16 MED ORDER — ETOMIDATE 2 MG/ML IV SOLN
INTRAVENOUS | Status: AC
  Filled 2012-02-16: qty 10

## 2012-02-16 NOTE — ED Notes (Signed)
Pt c/o L shoulder pain, deformity noted, pt states felt shoulder come out while sleeping, has had fx/dislocation in past

## 2012-02-16 NOTE — ED Provider Notes (Signed)
History     CSN: [id number]  Arrival date & time 02/16/12  0241   First MD Initiated Contact with Patient 02/16/12 [phone]      Chief Complaint  Patient presents with  . Shoulder Pain    (Consider location/radiation/quality/duration/timing/severity/associated sxs/prior treatment) HPI Comments: 31 year old female with a history of recurrent left shoulder dislocations. She states that approximately 45 minutes prior to arrival she was rolling around in bed and states that she felt immediate pain with deformity of her left shoulder. She has no numbness weakness or tingling in her left hand. The symptoms are persistent, moderate, worse with movement of the shoulder or the arm. She has had no pain medication prior to arrival and drove herself here.  Patient is a 31 y.o. female presenting with shoulder pain. The history is provided by the patient.  Shoulder Pain    Past Medical History  Diagnosis Date  . Herniated disc   . Shoulder dislocation, recurrent     No past surgical history on file.  No family history on file.  History  Substance Use Topics  . Smoking status: Former Games developer  . Smokeless tobacco: Not on file  . Alcohol Use: No    OB History    Grav Para Term Preterm Abortions TAB SAB Ect Mult Living                  Review of Systems  Musculoskeletal: Positive for joint swelling.  Neurological: Negative for weakness and numbness.    Allergies  Review of patient's allergies indicates no known allergies.  Home Medications   Current Outpatient Rx  Name Route Sig Dispense Refill  . MELOXICAM 7.5 MG PO TABS Oral Take 2 tablets (15 mg total) by mouth daily. 30 tablet 0    BP 141/79  Pulse 95  Temp 98.2 F (36.8 C) (Oral)  Resp 15  Ht 5\' 10"  (1.778 m)  Wt 175 lb (79.379 kg)  BMI 25.11 kg/m2  SpO2 99%  LMP 02/13/2012  Physical Exam  Nursing note and vitals reviewed. Constitutional: She appears well-developed and well-nourished.       Uncomfortable  appearing  HENT:  Head: Normocephalic and atraumatic.  Eyes: Conjunctivae are normal. No scleral icterus.  Cardiovascular: Normal rate, regular rhythm and intact distal pulses.   Pulmonary/Chest: Effort normal and breath sounds normal. No respiratory distress. She has no wheezes. She has no rales.  Musculoskeletal: She exhibits tenderness ( Focal tenderness to the left shoulder with deformity consistent with anterior  shoulder dislocation). She exhibits no edema.  Neurological: She is alert. Coordination normal.       Normal sensation and strength to the left grip  Skin: Skin is warm and dry. No rash noted. No erythema.    ED Course  Reduction of dislocation Date/Time: 02/16/2012 4:07 AM Performed by: Eber Hong D Authorized by: Eber Hong D Consent: Verbal consent obtained. Written consent obtained. Risks and benefits: risks, benefits and alternatives were discussed Consent given by: patient Patient understanding: patient states understanding of the procedure being performed Patient consent: the patient's understanding of the procedure matches consent given Procedure consent: procedure consent matches procedure scheduled Relevant documents: relevant documents present and verified Test results: test results available and properly labeled Site marked: the operative site was marked Imaging studies: imaging studies available Required items: required blood products, implants, devices, and special equipment available Patient identity confirmed: verbally with patient and arm band Time out: Immediately prior to procedure a "time out" was called to verify  the correct patient, procedure, equipment, support staff and site/side marked as required. Local anesthesia used: no Patient sedated: yes Sedatives: etomidate and midazolam Analgesia: fentanyl Vitals: Vital signs were monitored during sedation. Patient tolerance: Patient tolerated the procedure well with no immediate  complications. Comments: Reduced with manipulation of shoulder   (including critical care time)  Labs Reviewed - No data to display No results found.   1. Dislocation of left shoulder joint       MDM  Fentanyl given on arrival, patient has exam consistent with dislocation, no significant, as concerned for fracture. Attempt reduction without sedation first - not successful.  I have personally interpreted the postreduction films and found there to be good location of the shoulder in the joint.  Discharge Prescriptions include:  Meloxicam     Vida Roller, MD 02/16/12 [phone]

## 2012-02-16 NOTE — ED Notes (Signed)
EDP Miller not successful with first attempt to reduce left shoulder.  Verbal orders for versed and etomidate.  Pt set up for conscious sedation- consent signed

## 2012-02-16 NOTE — ED Notes (Signed)
MD at bedside. 

## 2012-02-16 NOTE — ED Notes (Signed)
Patient given information about follow up visit with orthopaedist and contact information. Patient given 1 rx. Husband coming to pick patient up.

## 2012-02-16 NOTE — ED Notes (Signed)
Conscious sedation successful- pt returned to baseline.  Topher RN assisted with sedation.  Tabetha RRT present,  EDP Miller performed time out- pt tolerated

## 2012-04-06 ENCOUNTER — Emergency Department (HOSPITAL_COMMUNITY): Payer: Self-pay

## 2012-04-06 ENCOUNTER — Emergency Department (HOSPITAL_COMMUNITY)
Admission: EM | Admit: 2012-04-06 | Discharge: 2012-04-06 | Disposition: A | Discharge status: Home / Self Care | Payer: Self-pay | Attending: Emergency Medicine | Admitting: Emergency Medicine

## 2012-04-06 ENCOUNTER — Encounter (HOSPITAL_COMMUNITY): Payer: Self-pay

## 2012-04-06 DIAGNOSIS — O2690 Pregnancy related conditions, unspecified, unspecified trimester: Secondary | ICD-10-CM | POA: Insufficient documentation

## 2012-04-06 DIAGNOSIS — O34599 Maternal care for other abnormalities of gravid uterus, unspecified trimester: Secondary | ICD-10-CM | POA: Insufficient documentation

## 2012-04-06 DIAGNOSIS — N83209 Unspecified ovarian cyst, unspecified side: Secondary | ICD-10-CM

## 2012-04-06 DIAGNOSIS — R102 Pelvic and perineal pain unspecified side: Secondary | ICD-10-CM

## 2012-04-06 DIAGNOSIS — IMO0002 Reserved for concepts with insufficient information to code with codable children: Secondary | ICD-10-CM

## 2012-04-06 DIAGNOSIS — M549 Dorsalgia, unspecified: Secondary | ICD-10-CM

## 2012-04-06 DIAGNOSIS — R112 Nausea with vomiting, unspecified: Secondary | ICD-10-CM

## 2012-04-06 LAB — URINALYSIS, ROUTINE W REFLEX MICROSCOPIC
Glucose, UA: NEGATIVE mg/dL
Nitrite: NEGATIVE
Protein, ur: NEGATIVE mg/dL

## 2012-04-06 LAB — URINE MICROSCOPIC-ADD ON

## 2012-04-06 LAB — WET PREP, GENITAL: Yeast Wet Prep HPF POC: NONE SEEN

## 2012-04-06 MED ORDER — KETOROLAC TROMETHAMINE 15 MG/ML IJ SOLN
15.0000 mg | Freq: Once | INTRAMUSCULAR | Status: AC
  Administered 2012-04-06: 15 mg via INTRAVENOUS
  Filled 2012-04-06: qty 1

## 2012-04-06 MED ORDER — HYDROCODONE-ACETAMINOPHEN 5-325 MG PO TABS: 2.0000 | ORAL_TABLET | ORAL | Status: DC | PRN

## 2012-04-06 MED ORDER — HYDROMORPHONE HCL PF 1 MG/ML IJ SOLN
0.5000 mg | Freq: Once | INTRAMUSCULAR | Status: AC
  Administered 2012-04-06: 0.5 mg via INTRAVENOUS
  Filled 2012-04-06: qty 1

## 2012-04-06 MED ORDER — HYDROMORPHONE HCL PF 1 MG/ML IJ SOLN: 0.5000 mg | Freq: Once | INTRAMUSCULAR | Status: DC

## 2012-04-06 MED ORDER — LACTATED RINGERS IV BOLUS (SEPSIS)
2000.0000 mL | Freq: Once | INTRAVENOUS | Status: AC
  Administered 2012-04-06: 2000 mL via INTRAVENOUS

## 2012-04-06 MED ORDER — ONDANSETRON HCL 4 MG/2ML IJ SOLN
4.0000 mg | Freq: Once | INTRAMUSCULAR | Status: AC
  Administered 2012-04-06: 4 mg via INTRAVENOUS
  Filled 2012-04-06: qty 2

## 2012-04-06 MED ORDER — PREDNISONE 20 MG PO TABS: 60.0000 mg | ORAL_TABLET | Freq: Every day | ORAL | Status: DC

## 2012-04-06 MED ORDER — PREDNISONE 20 MG PO TABS
60.0000 mg | ORAL_TABLET | Freq: Once | ORAL | Status: AC
  Administered 2012-04-06: 60 mg via ORAL
  Filled 2012-04-06: qty 3

## 2012-04-06 MED ORDER — IBUPROFEN 400 MG PO TABS: 400.0000 mg | ORAL_TABLET | Freq: Four times a day (QID) | ORAL | Status: DC | PRN

## 2012-04-06 NOTE — Progress Notes (Signed)
[facility] ED CM noted no pcp nor insurance coverage listed with 2 visits in last 6 months Spoke with pt who states she has not completed her medicaid papers and is pending medicaid consulted with [facility] community liasion who confirms pt has rolled off medicaid but had been receiving on a monthly basis.  Pt informed CM she is aware of the process of getting a family doctor through [employer] assistance for services and resources

## 2012-04-06 NOTE — ED Notes (Signed)
Dysuria and cloudy urine x months with white odorous vaginal discharge and new onset of vomiting last night. Also c/o pain on lower back worsen at movement.

## 2012-04-06 NOTE — ED Provider Notes (Signed)
History     CSN: [id number]  Arrival date & time 04/06/12  1191   First MD Initiated Contact with Patient 04/06/12 [phone]      Chief Complaint  Patient presents with  . Dysuria  . Vaginal Discharge  . Emesis    (Consider location/radiation/quality/duration/timing/severity/associated sxs/prior treatment) HPI [name] is a 31 y.o. female with a history of recurrent urinary tract infections especially when she takes baths. Patient had some sort of "dilation" procedure when she was a child and has had recurrent urinary tract infections to her life. She recently moved back in with her father and since then has not been taking showers but is been having to take baths due to her living arrangements. She says the symptoms started in mid-June when she moved in with her father and continued since then. Her symptoms consist of dysuria, frequency and occasional urgency this is also been associated with back pain throughout the last month which is been 3-4/10 it is typically respond well to Motrin however has not responded to over-the-counter medications over the past 3 days and is worsened to a 7-8/10 and has been associated with nausea and vomiting since last night. The dysuria is worsened by drinking coffee and soda. Symptoms are also associated with left lower quadrant pain which the patient thinks is the same pains in her back. It is dull, 7-8/10.   Patient is menstrual period was 03/12/2012, she has one sexual partner and has unprotected sex. She's been having dyspareunia over the last month 2. She also describes a thick white foul smelling vaginal discharge that she says has failed to respond to a 7 day over-the-counter yeast infection treatment.  No history of kidney stones.  She says she has no history of fevers but may have had some chills over the course the last month.   She also complains about eczema which she's had chronically on her DIPs extending to the fingertips has recently  flared up, she says it does so and periods of stress and is not responding to over-the-counter hydrocortisone treatments. She says these are painful, moderate to severe, sharp, well localized to the areas of rash, and the also itch  Patient is also complaining about a headache which is described as severe, in a bandlike pattern radiating around her head, it is throbbing,and not associated with photophobia or phonophobia, she denies any stiff neck.   she says this came on gradually and worsened to the longer she's been awake.    Past Medical History  Diagnosis Date  . Herniated disc   . Shoulder dislocation, recurrent     History reviewed. No pertinent past surgical history.  No family history on file.  History  Substance Use Topics  . Smoking status: Former Games developer  . Smokeless tobacco: Not on file  . Alcohol Use: No    OB History    Grav Para Term Preterm Abortions TAB SAB Ect Mult Living                  Review of Systems  GENERAL: Uncertain fevers or chills,  Denies weight loss.  HEENT: No change in vision, no earache, sore throat or sinus congestion.  Positive for runny nose and nasal congestion. NECK: No pain or stiffness.  CARDIOVASCULAR: No chest pain or pressure, palpitations, syncope.  PULMONARY: No shortness of breath, cough or wheeze.  GASTROINTESTINAL:   positive for lower abdominal pain worse on the left lower quadrant, nausea, vomiting.  Denies diarrhea, melena or  bright red blood per rectum.  GENITOURINARY:  positive for urinary frequency, urgency, hesitancy or dysuria And dyspareunia as well as vaginal discharge  MUSCULOSKELETAL: No joint or muscle pain, no back pain, no recent trauma.  DERMATOLOGIC: No rash, no itching, no lesions.  ENDOCRINE: No polyuria, polydipsia, no heat or cold intolerance.  HEMATOLOGICAL: No anemia or easy bruising or bleeding.  NEUROLOGIC:  Positive for headache,  denies seizures, numbness, tingling or weakness.  PSYCHIATRIC: No  depression, no loss of interest in normal activity or change in sleep pattern.   Allergies  Review of patient's allergies indicates no known allergies.  Home Medications   Current Outpatient Rx  Name Route Sig Dispense Refill  . MELOXICAM 7.5 MG PO TABS Oral Take 2 tablets (15 mg total) by mouth daily. 30 tablet 0    BP 133/91  Pulse 83  Temp 98.2 F (36.8 C) (Oral)  Resp 16  SpO2 98%  LMP 03/12/2012  Physical Exam  Nursing notes reviewed.  Electronic medical record reviewed. VITAL SIGNS:   Filed Vitals:   04/06/12 0857  BP: 133/91  Pulse: 83  Temp: 98.2 F (36.8 C)  TempSrc: Oral  Resp: 16  SpO2: 98%   CONSTITUTIONAL: Awake, oriented, appears non-toxic. Tearful HENT: Atraumatic, normocephalic, oral mucosa pink and moist, airway patent. Nares patent without drainage. External ears normal. EYES: Conjunctiva clear, EOMI, PERRLA NECK: Trachea midline, non-tender, supple CARDIOVASCULAR: Normal heart rate, Normal rhythm, No murmurs, rubs, gallops PULMONARY/CHEST: Clear to auscultation, no rhonchi, wheezes, or rales. Symmetrical breath sounds. Non-tender. ABDOMINAL: Non-distended, soft, mildly tender to palpation the left lower quadrant without rebound or guarding.  BS normal. BACK: Left CVA tenderness.  NEUROLOGIC: Non-focal, moving all four extremities, no gross sensory or motor deficits. EXTREMITIES: No clubbing, cyanosis, or edema SKIN: Warm, Dry, No erythema, No rash  ED Course  Procedures (including critical care time)  Labs Reviewed  URINALYSIS, ROUTINE W REFLEX MICROSCOPIC - Abnormal; Notable for the following:    Color, Urine AMBER (*)  BIOCHEMICALS MAY BE AFFECTED BY COLOR   APPearance CLOUDY (*)     Specific Gravity, Urine 1.039 (*)     Ketones, ur TRACE (*)     Leukocytes, UA TRACE (*)     All other components within normal limits  WET PREP, GENITAL - Abnormal; Notable for the following:    Clue Cells Wet Prep HPF POC FEW (*)     WBC, Wet Prep HPF POC  FEW (*)     All other components within normal limits  URINE MICROSCOPIC-ADD ON - Abnormal; Notable for the following:    Squamous Epithelial / LPF MANY (*)     Bacteria, UA MANY (*)     All other components within normal limits  PREGNANCY, URINE  GC/CHLAMYDIA PROBE AMP, GENITAL   US Transvaginal Non-ob  04/06/2012  *RADIOLOGY REPORT*  Clinical Data: Dysuria, vaginal discharge  TRANSABDOMINAL AND TRANSVAGINAL ULTRASOUND OF PELVIS Technique:  Both transabdominal and transvaginal ultrasound examinations of the pelvis were performed. Transabdominal technique was performed for global imaging of the pelvis including uterus, ovaries, adnexal regions, and pelvic cul-de-sac.  It was necessary to proceed with endovaginal exam following the transabdominal exam to visualize the ovaries and endometrium.  Comparison:  None  Findings:  Uterus: 9.1 cm length by 4.7 cm AP by 6.7 cm transverse. Heterogeneous myometrial echogenicity.  No discrete mass.  Endometrium: 11 mm thick, borderline prominent.  No definite endometrial fluid.  Right ovary:  3.6 x 1.8 x 1.9 cm.  Normal morphology without mass.  Left ovary: 5.6 x 3.9 x 4.9 cm.  Complex question hemorrhagic cyst left ovary 4.0 x 3.9 x 3.4 cm.  Other findings: No free pelvic fluid.  Small right adnexal cyst identified 1.5 x 1.2 x 1.6 cm, question para ovarian cyst, simple features.  IMPRESSION: Question hemorrhagic cyst left ovary 4.0 x 3.9 x 3.4 cm. Follow-up ultrasound recommended in 6-12 weeks to reassess this lesion and exclude neoplasm. Tiny simple-appearing paraovarian cyst right adnexa.   Original Report Authenticated By: Lollie Marrow, M.D.    US Pelvis Complete  04/06/2012  *RADIOLOGY REPORT*  Clinical Data: Dysuria, vaginal discharge  TRANSABDOMINAL AND TRANSVAGINAL ULTRASOUND OF PELVIS Technique:  Both transabdominal and transvaginal ultrasound examinations of the pelvis were performed. Transabdominal technique was performed for global imaging of the pelvis  including uterus, ovaries, adnexal regions, and pelvic cul-de-sac.  It was necessary to proceed with endovaginal exam following the transabdominal exam to visualize the ovaries and endometrium.  Comparison:  None  Findings:  Uterus: 9.1 cm length by 4.7 cm AP by 6.7 cm transverse. Heterogeneous myometrial echogenicity.  No discrete mass.  Endometrium: 11 mm thick, borderline prominent.  No definite endometrial fluid.  Right ovary:  3.6 x 1.8 x 1.9 cm.  Normal morphology without mass.  Left ovary: 5.6 x 3.9 x 4.9 cm.  Complex question hemorrhagic cyst left ovary 4.0 x 3.9 x 3.4 cm.  Other findings: No free pelvic fluid.  Small right adnexal cyst identified 1.5 x 1.2 x 1.6 cm, question para ovarian cyst, simple features.  IMPRESSION: Question hemorrhagic cyst left ovary 4.0 x 3.9 x 3.4 cm. Follow-up ultrasound recommended in 6-12 weeks to reassess this lesion and exclude neoplasm. Tiny simple-appearing paraovarian cyst right adnexa.   Original Report Authenticated By: Lollie Marrow, M.D.      1. Ovarian cyst   2. Back pain   3. Nausea and vomiting   4. Pelvic pain complicating pregnancy   5. Dyspareunia       MDM  ROYALTI SCHAUF is a 31 y.o. female presenting with left lower quadrant and suprapubic pain associated with urgency frequency and vaginal discharge.  Her urinalysis is inconsistent with a UTI likewise not consistent with a stone, an ultrasound was obtained to look at her female anatomy and revealed a left-sided hemorrhagic cyst 4 x 4 by 3 cm.  Discussed this with his most consistent with a hemorrhagic cyst but should be followed up in 6-12 weeks. She is concerned because mother had ovarian cysts removed were thought to be malignant. Patient has been reassured is likely a hemorrhagic cyst and there will likely be some continued pain as the cysts resolve.  She is feeling better after fluids and pain medicines. She says she will go tomorrow and finish up her Medicaid paperwork.  She's been  given referrals to multiple OB/GYN's to followup for physical exam, assessment endocervical followup ultrasound in 6-12 weeks. Also given her a prescription for a prednisone Dosepak to calm down her eczema flareup.  This time there is no evidence for an emergent intra-abdominal intrapelvic pathology, likely has a hemorrhagic cyst which has been causing her pain in the pelvis as well as the urgency and sense of pressure in her bladder.  I think the patient's vaginal discharge may be due to a change in vaginal flora, as mentioned in history of present illness the patient is been bathing which typically is caused her to have yeast infections or UTIs in the past.  There is no evidence of UTI at this time, patient will not need antibiotics.  I explained the diagnosis and have given explicit precautions to return to the ER including worsening pain despite medications, fevers, chills, inability to take fluids with nausea and vomiting or any other new or worsening symptoms. The patient understands and accepts the medical plan as it's been dictated and I have answered their questions. Discharge instructions concerning home care and prescriptions have been given.  The patient is STABLE and is discharged to home in good condition.   Jones Skene, MD 04/07/12 [phone]

## 2012-04-06 NOTE — ED Notes (Signed)
Pt states since June she has had a UTI, but the past 3 days pain has worsened, having lower abdominal pain, dysuria, frequency, lower back pain, but pain is worse on L side. Pt states pain is worse when palpated or at home when she drinks soda/tea. Pt states she has a hx of UTI's, denies hx of kidney stones. Pt states started having nausea and vomiting last night. Pt states pain is 8/10 and also having a headache that is a 10/10. Pt tearful d/t pain. Pt having discharge, thought it was a yeast infection but took monastat but it did not go away. Pt has not seen MD for UTI symptoms or taken any medication beside Ibuprofen.

## 2012-04-06 NOTE — ED Notes (Signed)
Patient transported to Ultrasound 

## 2012-10-10 ENCOUNTER — Emergency Department (HOSPITAL_COMMUNITY)
Admission: EM | Admit: 2012-10-10 | Discharge: 2012-10-11 | Disposition: A | Discharge status: Home / Self Care | Payer: Medicaid Other | Attending: Emergency Medicine | Admitting: Emergency Medicine

## 2012-10-10 ENCOUNTER — Encounter (HOSPITAL_COMMUNITY): Payer: Self-pay | Admitting: *Deleted

## 2012-10-10 DIAGNOSIS — O039 Complete or unspecified spontaneous abortion without complication: Secondary | ICD-10-CM | POA: Insufficient documentation

## 2012-10-10 DIAGNOSIS — R109 Unspecified abdominal pain: Secondary | ICD-10-CM | POA: Insufficient documentation

## 2012-10-10 DIAGNOSIS — Z87891 Personal history of nicotine dependence: Secondary | ICD-10-CM | POA: Insufficient documentation

## 2012-10-10 DIAGNOSIS — Z79899 Other long term (current) drug therapy: Secondary | ICD-10-CM | POA: Insufficient documentation

## 2012-10-10 DIAGNOSIS — Z3201 Encounter for pregnancy test, result positive: Secondary | ICD-10-CM | POA: Insufficient documentation

## 2012-10-10 DIAGNOSIS — Z8739 Personal history of other diseases of the musculoskeletal system and connective tissue: Secondary | ICD-10-CM | POA: Insufficient documentation

## 2012-10-10 LAB — POCT I-STAT, CHEM 8
BUN: 9 mg/dL (ref 6–23)
Chloride: 108 mEq/L (ref 96–112)
Potassium: 3.5 mEq/L (ref 3.5–5.1)
Sodium: 142 mEq/L (ref 135–145)

## 2012-10-10 LAB — CBC WITH DIFFERENTIAL/PLATELET
Eosinophils Absolute: 0.1 10*3/uL (ref 0.0–0.7)
Hemoglobin: 11.5 g/dL — ABNORMAL LOW (ref 12.0–15.0)
Lymphocytes Relative: 52 % — ABNORMAL HIGH (ref 12–46)
Lymphs Abs: 2.7 10*3/uL (ref 0.7–4.0)
MCH: 30.2 pg (ref 26.0–34.0)
Monocytes Relative: 9 % (ref 3–12)
Neutro Abs: 2 10*3/uL (ref 1.7–7.7)
Neutrophils Relative %: 38 % — ABNORMAL LOW (ref 43–77)
RBC: 3.81 MIL/uL — ABNORMAL LOW (ref 3.87–5.11)

## 2012-10-10 LAB — TYPE AND SCREEN
ABO/RH(D): O POS
Antibody Screen: NEGATIVE

## 2012-10-10 LAB — POCT PREGNANCY, URINE: Preg Test, Ur: POSITIVE — AB

## 2012-10-10 MED ORDER — MORPHINE SULFATE 4 MG/ML IJ SOLN
4.0000 mg | Freq: Once | INTRAMUSCULAR | Status: AC
  Administered 2012-10-10: 4 mg via INTRAVENOUS
  Filled 2012-10-10: qty 1

## 2012-10-10 MED ORDER — SODIUM CHLORIDE 0.9 % IV BOLUS (SEPSIS)
1000.0000 mL | Freq: Once | INTRAVENOUS | Status: AC
  Administered 2012-10-10: 1000 mL via INTRAVENOUS

## 2012-10-10 MED ORDER — ONDANSETRON HCL 4 MG/2ML IJ SOLN
4.0000 mg | Freq: Once | INTRAMUSCULAR | Status: AC
  Administered 2012-10-10: 4 mg via INTRAVENOUS
  Filled 2012-10-10: qty 2

## 2012-10-10 NOTE — ED Notes (Signed)
POCT Preg resulted POS.

## 2012-10-10 NOTE — ED Provider Notes (Signed)
History     CSN: [id number]  Arrival date & time 10/10/12  2150   First MD Initiated Contact with Patient 10/10/12 2207      Chief Complaint  Patient presents with  . Vaginal Bleeding    (Consider location/radiation/quality/duration/timing/severity/associated sxs/prior treatment) HPI Comments: LMP 2/17 2 home pregnancy tests positive today abdominal cramping and vaginal bleeding with clots  Patient has had 2 previous pregnancies without incident   Patient is a 32 y.o. female presenting with vaginal bleeding. The history is provided by the patient.  Vaginal Bleeding This is a new problem. The current episode started today. The problem occurs constantly. The problem has been gradually worsening. Associated symptoms include abdominal pain. Pertinent negatives include no chills, fever, nausea, urinary symptoms or vomiting.    Past Medical History  Diagnosis Date  . Herniated disc   . Shoulder dislocation, recurrent     History reviewed. No pertinent past surgical history.  History reviewed. No pertinent family history.  History  Substance Use Topics  . Smoking status: Former Games developer  . Smokeless tobacco: Not on file  . Alcohol Use: No    OB History   Grav Para Term Preterm Abortions TAB SAB Ect Mult Living                  Review of Systems  Constitutional: Negative for fever and chills.  Gastrointestinal: Positive for abdominal pain. Negative for nausea and vomiting.  Genitourinary: Positive for vaginal bleeding.  All other systems reviewed and are negative.    Allergies  Review of patient's allergies indicates no known allergies.  Home Medications   Current Outpatient Rx  Name  Route  Sig  Dispense  Refill  . aspirin-acetaminophen-caffeine (EXCEDRIN MIGRAINE) 250-250-65 MG per tablet   Oral   Take 1 tablet by mouth every 6 (six) hours as needed for pain. For headache         . HYDROcodone-acetaminophen (NORCO/VICODIN) 5-325 MG per tablet   Oral   Take  1 tablet by mouth every 6 (six) hours as needed for pain.   30 tablet   0     BP 140/86  Pulse 93  Temp(Src) 98.8 F (37.1 C) (Oral)  Resp 18  SpO2 100%  Physical Exam  Nursing note and vitals reviewed. Constitutional: She is oriented to person, place, and time. She appears well-developed.  HENT:  Head: Normocephalic.  Eyes: Pupils are equal, round, and reactive to light.  Neck: Normal range of motion.  Cardiovascular: Normal rate.   Pulmonary/Chest: Effort normal.  Abdominal: She exhibits no distension. There is no tenderness.  Musculoskeletal: Normal range of motion.  Neurological: She is alert and oriented to person, place, and time.  Skin: There is pallor.    ED Course  Procedures (including critical care time)  Labs Reviewed  CBC WITH DIFFERENTIAL - Abnormal; Notable for the following:    RBC 3.81 (*)    Hemoglobin 11.5 (*)    HCT 33.3 (*)    Neutrophils Relative 38 (*)    Lymphocytes Relative 52 (*)    All other components within normal limits  HCG, QUANTITATIVE, PREGNANCY - Abnormal; Notable for the following:    hCG, Beta Chain, Quant, S 1845 (*)    All other components within normal limits  POCT PREGNANCY, URINE - Abnormal; Notable for the following:    Preg Test, Ur POSITIVE (*)    All other components within normal limits  POCT I-STAT, CHEM 8 - Abnormal; Notable for the following:  Hemoglobin 11.9 (*)    HCT 35.0 (*)    All other components within normal limits  ABO/RH  TYPE AND SCREEN   US Ob Comp Add'l Gest Less 14 Wks  10/11/2012  *RADIOLOGY REPORT*  Clinical Data: Positive pregnancy test.  Vaginal bleeding.  OBSTETRIC <14 WK [place] AND TRANSVAGINAL OB US  Technique:  Both transabdominal and transvaginal ultrasound examinations were performed for complete evaluation of the gestation as well as the maternal uterus, adnexal regions, and pelvic cul-de-sac.  Transvaginal technique was performed to assess early pregnancy.  Comparison:  04/06/2012   Intrauterine gestational sac:  None Yolk sac: None Embryo: None Cardiac Activity: None  Maternal uterus/adnexae: Uterus is normal in size and echotexture measuring approximately 6.9 x 3.4 x 6.0 centimeters.  No focal cystic or solid uterine lesions.  The endometrium appears heterogeneous and thickened measuring 21 mm.  Right ovary is normal in echotexture and appearance measuring 2.8 x 2.3 x 0.9 cm.  Multiple small follicles. Left ovary is normal in appearance measuring 3.2 x 1.5 x 2.3 cm. There are multiple small follicles in the left ovary, as well as a small area of heterogeneous echotexture measuring approximately 2.1 x 1.2 x 1.4 cm, which is likely represents a degenerating corpus luteum cyst.  Trace volume of free fluid the cul-de-sac.  IMPRESSION: 1.  No IUP identified. 2.  No suspicious findings to suggest an ectopic pregnancy at this time. 3.  Endometrium appears thickened and is heterogeneous in appearance. 4.  Degenerating corpus luteum cyst in the left ovary incidentally noted.   Original Report Authenticated By: Trudie Reed, M.D.    US Ob Transvaginal  10/11/2012  *RADIOLOGY REPORT*  Clinical Data: Positive pregnancy test.  Vaginal bleeding.  OBSTETRIC <14 WK [place] AND TRANSVAGINAL OB US  Technique:  Both transabdominal and transvaginal ultrasound examinations were performed for complete evaluation of the gestation as well as the maternal uterus, adnexal regions, and pelvic cul-de-sac.  Transvaginal technique was performed to assess early pregnancy.  Comparison:  04/06/2012  Intrauterine gestational sac:  None Yolk sac: None Embryo: None Cardiac Activity: None  Maternal uterus/adnexae: Uterus is normal in size and echotexture measuring approximately 6.9 x 3.4 x 6.0 centimeters.  No focal cystic or solid uterine lesions.  The endometrium appears heterogeneous and thickened measuring 21 mm.  Right ovary is normal in echotexture and appearance measuring 2.8 x 2.3 x 0.9 cm.  Multiple small follicles.  Left ovary is normal in appearance measuring 3.2 x 1.5 x 2.3 cm. There are multiple small follicles in the left ovary, as well as a small area of heterogeneous echotexture measuring approximately 2.1 x 1.2 x 1.4 cm, which is likely represents a degenerating corpus luteum cyst.  Trace volume of free fluid the cul-de-sac.  IMPRESSION: 1.  No IUP identified. 2.  No suspicious findings to suggest an ectopic pregnancy at this time. 3.  Endometrium appears thickened and is heterogeneous in appearance. 4.  Degenerating corpus luteum cyst in the left ovary incidentally noted.   Original Report Authenticated By: Trudie Reed, M.D.      1. Complete miscarriage       MDM   Ultrasound reviewed there is no yolk sac.  No fetal activity.  Also, no indication for an ectopic pregnancy, at this time.  Bleeding has slowed, and patient is more comfortable without cramping        Arman Filter, NP 10/11/12 [phone]

## 2012-10-10 NOTE — ED Notes (Signed)
Pt in c/o vaginal bleeding and lower back pain, pt states she is pregnant but isn't sure how far along, LMP 3/17. Pt states she noted some spotting last night but today she had an episode of large bleeding with clots.

## 2012-10-11 ENCOUNTER — Emergency Department (HOSPITAL_COMMUNITY): Payer: Medicaid Other

## 2012-10-11 MED ORDER — HYDROCODONE-ACETAMINOPHEN 5-325 MG PO TABS: 1.0000 | ORAL_TABLET | Freq: Four times a day (QID) | ORAL | Status: DC | PRN

## 2012-10-11 MED ORDER — MORPHINE SULFATE 4 MG/ML IJ SOLN
4.0000 mg | Freq: Once | INTRAMUSCULAR | Status: DC
  Filled 2012-10-11: qty 1

## 2012-10-11 NOTE — ED Provider Notes (Signed)
Medical screening examination/treatment/procedure(s) were performed by non-physician practitioner and as supervising physician I was immediately available for consultation/collaboration.    Cassey Bacigalupo R Lilliah Priego, MD 10/11/12 1650 

## 2012-10-11 NOTE — ED Notes (Signed)
Patient is alert and oriented x3.  She was given DC instructions and follow up visit instructions.  Patient gave verbal understanding. She was DC ambulatory under her own power to home.  V/S stable.  He was not showing any signs of distress on DC 

## 2012-10-12 ENCOUNTER — Emergency Department (HOSPITAL_COMMUNITY)
Admission: EM | Admit: 2012-10-12 | Discharge: 2012-10-13 | Disposition: A | Discharge status: Home / Self Care | Payer: Medicaid Other | Attending: Emergency Medicine | Admitting: Emergency Medicine

## 2012-10-12 ENCOUNTER — Encounter (HOSPITAL_COMMUNITY): Payer: Self-pay | Admitting: *Deleted

## 2012-10-12 DIAGNOSIS — Z8742 Personal history of other diseases of the female genital tract: Secondary | ICD-10-CM | POA: Insufficient documentation

## 2012-10-12 DIAGNOSIS — Z87828 Personal history of other (healed) physical injury and trauma: Secondary | ICD-10-CM | POA: Insufficient documentation

## 2012-10-12 DIAGNOSIS — N939 Abnormal uterine and vaginal bleeding, unspecified: Secondary | ICD-10-CM

## 2012-10-12 DIAGNOSIS — Z87891 Personal history of nicotine dependence: Secondary | ICD-10-CM | POA: Insufficient documentation

## 2012-10-12 DIAGNOSIS — Z8719 Personal history of other diseases of the digestive system: Secondary | ICD-10-CM | POA: Insufficient documentation

## 2012-10-12 DIAGNOSIS — R609 Edema, unspecified: Secondary | ICD-10-CM | POA: Insufficient documentation

## 2012-10-12 DIAGNOSIS — R109 Unspecified abdominal pain: Secondary | ICD-10-CM | POA: Insufficient documentation

## 2012-10-12 DIAGNOSIS — N898 Other specified noninflammatory disorders of vagina: Secondary | ICD-10-CM | POA: Insufficient documentation

## 2012-10-12 NOTE — ED Notes (Signed)
Pt states the she is not saturating pads but is changing her pads frequently every 2-3 hrs.

## 2012-10-12 NOTE — ED Notes (Signed)
Pt was seen on 3/29 and was diagnosed with a complete miscarriage; pt states that she is still bleeding and passing blood clots and having pain; pt states that the pain medication that was prescribed is not helping the pain.

## 2012-10-13 ENCOUNTER — Emergency Department (HOSPITAL_COMMUNITY): Payer: Medicaid Other

## 2012-10-13 LAB — CBC
MCHC: 34.4 g/dL (ref 30.0–36.0)
Platelets: 261 10*3/uL (ref 150–400)
RDW: 13.5 % (ref 11.5–15.5)
WBC: 6 10*3/uL (ref 4.0–10.5)

## 2012-10-13 LAB — HCG, QUANTITATIVE, PREGNANCY: hCG, Beta Chain, Quant, S: 1157 m[IU]/mL — ABNORMAL HIGH (ref <5)

## 2012-10-13 MED ORDER — OXYCODONE-ACETAMINOPHEN 5-325 MG PO TABS: 1.0000 | ORAL_TABLET | Freq: Four times a day (QID) | ORAL | Status: DC | PRN

## 2012-10-13 MED ORDER — MORPHINE SULFATE 4 MG/ML IJ SOLN
4.0000 mg | Freq: Once | INTRAMUSCULAR | Status: AC
  Administered 2012-10-13: 4 mg via INTRAVENOUS
  Filled 2012-10-13: qty 1

## 2012-10-13 MED ORDER — SODIUM CHLORIDE 0.9 % IV SOLN
Freq: Once | INTRAVENOUS | Status: AC
  Administered 2012-10-13: 01:00:00 via INTRAVENOUS

## 2012-10-13 MED ORDER — ONDANSETRON HCL 4 MG/2ML IJ SOLN
4.0000 mg | Freq: Once | INTRAMUSCULAR | Status: AC
  Administered 2012-10-13: 4 mg via INTRAVENOUS
  Filled 2012-10-13: qty 2

## 2012-10-13 NOTE — ED Notes (Signed)
Pt to US at this time.

## 2012-10-13 NOTE — ED Provider Notes (Signed)
History     CSN: [id number]  Arrival date & time 10/12/12  2157   First MD Initiated Contact with Patient 10/13/12 0020      Chief Complaint  Patient presents with  . Vaginal Bleeding    (Consider location/radiation/quality/duration/timing/severity/associated sxs/prior treatment) HPI Comments: Patient was here on the 29th she had a complete miscarriage.  At that time.  Presents tonight with persistent abdominal cramping, passing clots.  States she has been using the prescribed medication as directed with little relief.  Patient is a 32 y.o. female presenting with vaginal bleeding. The history is provided by the patient.  Vaginal Bleeding This is a recurrent problem. The current episode started in the past 7 days. The problem occurs intermittently. Associated symptoms include abdominal pain. Pertinent negatives include no chills, fever, nausea, vomiting or weakness. Nothing aggravates the symptoms. She has tried oral narcotics for the symptoms. The treatment provided moderate relief.    Past Medical History  Diagnosis Date  . Herniated disc   . Shoulder dislocation, recurrent     History reviewed. No pertinent past surgical history.  History reviewed. No pertinent family history.  History  Substance Use Topics  . Smoking status: Former Games developer  . Smokeless tobacco: Not on file  . Alcohol Use: No    OB History   Grav Para Term Preterm Abortions TAB SAB Ect Mult Living                  Review of Systems  Constitutional: Negative for fever, chills, activity change and appetite change.  Gastrointestinal: Positive for abdominal pain. Negative for nausea and vomiting.  Genitourinary: Positive for vaginal bleeding. Negative for dysuria and vaginal discharge.  Neurological: Negative for weakness.  All other systems reviewed and are negative.    Allergies  Review of patient's allergies indicates no known allergies.  Home Medications   Current Outpatient Rx  Name   Route  Sig  Dispense  Refill  . HYDROcodone-acetaminophen (NORCO/VICODIN) 5-325 MG per tablet   Oral   Take 1 tablet by mouth every 6 (six) hours as needed for pain.   30 tablet   0   . aspirin-acetaminophen-caffeine (EXCEDRIN MIGRAINE) 250-250-65 MG per tablet   Oral   Take 1 tablet by mouth every 6 (six) hours as needed for pain. For headache         . oxyCODONE-acetaminophen (PERCOCET/ROXICET) 5-325 MG per tablet   Oral   Take 1-2 tablets by mouth every 6 (six) hours as needed for pain.   20 tablet   0     BP 130/83  Pulse 87  Temp(Src) 98.4 F (36.9 C) (Oral)  Resp 18  SpO2 99%  LMP 08/31/2012  Physical Exam  Nursing note and vitals reviewed. Constitutional: She appears well-developed and well-nourished.  HENT:  Head: Normocephalic.  Eyes: Pupils are equal, round, and reactive to light.  Cardiovascular: Normal rate.   Pulmonary/Chest: Effort normal.  Abdominal: Soft. She exhibits no distension. There is no tenderness.  Musculoskeletal: She exhibits edema.  Neurological: She is alert.  Skin: Skin is warm and dry.    ED Course  Procedures (including critical care time)  Labs Reviewed  HCG, QUANTITATIVE, PREGNANCY - Abnormal; Notable for the following:    hCG, Beta Chain, Quant, S 1157 (*)    All other components within normal limits  CBC - Abnormal; Notable for the following:    RBC 3.82 (*)    Hemoglobin 11.5 (*)    HCT 33.4 (*)  All other components within normal limits   US Ob Comp Less 14 Wks  10/13/2012  *RADIOLOGY REPORT*  Clinical Data: Vaginal bleeding and pelvic pain.  Estimated gestational age by LMP is 6 weeks 1 day.  Quantitative beta HCG is 1157 and is not decreasing.  OBSTETRIC <14 WK [place] AND TRANSVAGINAL OB US  Technique:  Both transabdominal and transvaginal ultrasound examinations were performed for complete evaluation of the gestation as well as the maternal uterus, adnexal regions, and pelvic cul-de-sac.  Transvaginal technique was  performed to assess early pregnancy.  Comparison:  10/11/2012  Intrauterine gestational sac:  No intrauterine gestational sac is visualized. Yolk sac: Not visualized Embryo: Not visualized Cardiac Activity: Not visualized  Maternal uterus/adnexae: The uterus appears retroverted.  No myometrial masses.  The endometrium is thickened and heterogeneous in appearance with next predominately hypoechoic appearance.  Endometrial stripe thickness is measured at 16 mm.  The left ovary measures 2.4 x 1.5 x 1.9 cm again is normal follicular changes.  The right ovary measures 3 x 1.6 x 1.6 cm.  Adjacent to the right ovary, there is a cystic structure measuring about 1.9 cm diameter. There is mild complexity with internal echoes present.  This was present previously.  There is a new finding of fluid around the cystic structure.  No free fluid appears to be complex.  The diameter of the cyst at 19 mm with suggest an estimated gestational age of [redacted] weeks 6 days by mean sac diameter.  Although it is difficult to demonstrate a trend with only a 2-day follow-up from the previous study, persistent finding of a abnormal endometrium without defined intrauterine pregnancy bases suspicious for an ectopic pregnancy  with pseudogestational reaction in the endometrium.  Alternatively, this could represent incidental adnexal cyst or focal dilated tube with an early or abnormal intrauterine pregnancy.  IMPRESSION: Persistent finding of abnormal thickened endometrium without intrauterine gestational sac.  There is an indeterminate cystic structure in the right adnexa with developing adjacent complex free fluid.  Although nonspecific, an ectopic pregnancy is not excluded.  Results were discussed with Dr. Patria Mane at 0235 hours on 10/13/2012.   Original Report Authenticated By: Burman Nieves, M.D.    US Ob Transvaginal  10/13/2012  *RADIOLOGY REPORT*  Clinical Data: Vaginal bleeding and pelvic pain.  Estimated gestational age by LMP is 6 weeks 1  day.  Quantitative beta HCG is 1157 and is not decreasing.  OBSTETRIC <14 WK [place] AND TRANSVAGINAL OB US  Technique:  Both transabdominal and transvaginal ultrasound examinations were performed for complete evaluation of the gestation as well as the maternal uterus, adnexal regions, and pelvic cul-de-sac.  Transvaginal technique was performed to assess early pregnancy.  Comparison:  10/11/2012  Intrauterine gestational sac:  No intrauterine gestational sac is visualized. Yolk sac: Not visualized Embryo: Not visualized Cardiac Activity: Not visualized  Maternal uterus/adnexae: The uterus appears retroverted.  No myometrial masses.  The endometrium is thickened and heterogeneous in appearance with next predominately hypoechoic appearance.  Endometrial stripe thickness is measured at 16 mm.  The left ovary measures 2.4 x 1.5 x 1.9 cm again is normal follicular changes.  The right ovary measures 3 x 1.6 x 1.6 cm.  Adjacent to the right ovary, there is a cystic structure measuring about 1.9 cm diameter. There is mild complexity with internal echoes present.  This was present previously.  There is a new finding of fluid around the cystic structure.  No free fluid appears to be complex.  The diameter  of the cyst at 19 mm with suggest an estimated gestational age of [redacted] weeks 6 days by mean sac diameter.  Although it is difficult to demonstrate a trend with only a 2-day follow-up from the previous study, persistent finding of a abnormal endometrium without defined intrauterine pregnancy bases suspicious for an ectopic pregnancy  with pseudogestational reaction in the endometrium.  Alternatively, this could represent incidental adnexal cyst or focal dilated tube with an early or abnormal intrauterine pregnancy.  IMPRESSION: Persistent finding of abnormal thickened endometrium without intrauterine gestational sac.  There is an indeterminate cystic structure in the right adnexa with developing adjacent complex free fluid.   Although nonspecific, an ectopic pregnancy is not excluded.  Results were discussed with Dr. Patria Mane at 0235 hours on 10/13/2012.   Original Report Authenticated By: Burman Nieves, M.D.      1. Vaginal bleeding       MDM   Abnormal findings on ultrasound  Discussed with Dr. Debroah Loop who would like repeat quant in 2 days         Arman Filter, NP 10/13/12 [phone]

## 2012-10-13 NOTE — ED Notes (Signed)
Pt back from US at this time.

## 2012-10-13 NOTE — ED Provider Notes (Signed)
Medical screening examination/treatment/procedure(s) were conducted as a shared visit with non-physician practitioner(s) and myself.  I personally evaluated the patient during the encounter  I personally discussed the ultrasound findings with the radiologist.  I spoke with Dr. Debroah Loop of OB/GYN.  We discussed the patient's history at length and given her decreasing hCG quantitative is felt as though this patient will be stable for discharge.  Her abdomen is without significant tenderness.  There is no peritonitis.  Her vital signs are normal.  Her hemoglobin is 11.6, which is unchanged from her prior.  The patient will followup with GYN at [facility] hospital for repeat hCG and 36 hours.  I personally gave her strict return precautions including worsening vaginal bleeding or worsening abdominal pain or lightheadedness or syncope.  The patient was instructed that if any these were to recur she [facility]  She and her companion understand the discharge instructions and all questions were answered   Lyanne Co, MD 10/13/12 [phone]

## 2012-10-14 ENCOUNTER — Other Ambulatory Visit: Payer: Self-pay

## 2012-10-14 DIAGNOSIS — O039 Complete or unspecified spontaneous abortion without complication: Secondary | ICD-10-CM

## 2012-10-14 LAB — HCG, QUANTITATIVE, PREGNANCY: hCG, Beta Chain, Quant, S: 622.8 m[IU]/mL

## 2012-10-29 ENCOUNTER — Other Ambulatory Visit: Payer: Self-pay | Admitting: Family

## 2012-10-29 ENCOUNTER — Other Ambulatory Visit: Payer: Self-pay

## 2012-10-29 ENCOUNTER — Telehealth: Payer: Self-pay | Admitting: Obstetrics & Gynecology

## 2012-10-29 ENCOUNTER — Ambulatory Visit (HOSPITAL_COMMUNITY)
Admission: AD | Admit: 2012-10-29 | Discharge: 2012-10-29 | Disposition: A | Discharge status: Home / Self Care | Payer: Self-pay | Source: Ambulatory Visit | Attending: Obstetrics & Gynecology | Admitting: Obstetrics & Gynecology

## 2012-10-29 NOTE — Telephone Encounter (Signed)
Called patient about her appointment, if she could come in today to get a BHCG. Patient stated she was going out of town, and she could not come in. After talking to [facility], she was told to come to the [facility] to get labs done since we would not be open on Friday. Then I made an appointment for her to come in and F/U with Dr Debroah Loop.

## 2012-11-02 ENCOUNTER — Encounter: Payer: Self-pay | Admitting: Obstetrics & Gynecology

## 2012-11-02 ENCOUNTER — Ambulatory Visit (INDEPENDENT_AMBULATORY_CARE_PROVIDER_SITE_OTHER): Payer: Medicaid Other | Admitting: Obstetrics & Gynecology

## 2012-11-02 VITALS — BP 129/80 | HR 81 | Temp 97.1°F | Ht 70.0 in | Wt 177.4 lb

## 2012-11-02 DIAGNOSIS — O039 Complete or unspecified spontaneous abortion without complication: Secondary | ICD-10-CM | POA: Insufficient documentation

## 2012-11-02 MED ORDER — IBUPROFEN 800 MG PO TABS: 800.0000 mg | ORAL_TABLET | Freq: Three times a day (TID) | ORAL | Status: DC | PRN

## 2012-11-02 NOTE — Patient Instructions (Signed)
Miscarriage A miscarriage is the sudden loss of an unborn baby (fetus) before the 20th week of pregnancy. Most miscarriages happen in the first 3 months of pregnancy. Sometimes, it happens before a woman even knows she is pregnant. A miscarriage is also called a "spontaneous miscarriage" or "early pregnancy loss." Having a miscarriage can be an emotional experience. Talk with your caregiver about any questions you may have about miscarrying, the grieving process, and your future pregnancy plans. CAUSES   Problems with the fetal chromosomes that make it impossible for the baby to develop normally. Problems with the baby's genes or chromosomes are most often the result of errors that occur, by chance, as the embryo divides and grows. The problems are not inherited from the parents.  Infection of the cervix or uterus.   Hormone problems.   Problems with the cervix, such as having an incompetent cervix. This is when the tissue in the cervix is not strong enough to hold the pregnancy.   Problems with the uterus, such as an abnormally shaped uterus, uterine fibroids, or congenital abnormalities.   Certain medical conditions.   Smoking, drinking alcohol, or taking illegal drugs.   Trauma.  Often, the cause of a miscarriage is unknown.  SYMPTOMS   Vaginal bleeding or spotting, with or without cramps or pain.  Pain or cramping in the abdomen or lower back.  Passing fluid, tissue, or blood clots from the vagina. DIAGNOSIS  Your caregiver will perform a physical exam. You may also have an ultrasound to confirm the miscarriage. Blood or urine tests may also be ordered. TREATMENT   Sometimes, treatment is not necessary if you naturally pass all the fetal tissue that was in the uterus. If some of the fetus or placenta remains in the body (incomplete miscarriage), tissue left behind may become infected and must be removed. Usually, a dilation and curettage (D and C) procedure is performed.  During a D and C procedure, the cervix is widened (dilated) and any remaining fetal or placental tissue is gently removed from the uterus.  Antibiotic medicines are prescribed if there is an infection. Other medicines may be given to reduce the size of the uterus (contract) if there is a lot of bleeding.  If you have Rh negative blood and your baby was Rh positive, you will need a Rh immunoglobulin shot. This shot will protect any future baby from having Rh blood problems in future pregnancies. HOME CARE INSTRUCTIONS   Your caregiver may order bed rest or may allow you to continue light activity. Resume activity as directed by your caregiver.  Have someone help with home and family responsibilities during this time.   Keep track of the number of sanitary pads you use each day and how soaked (saturated) they are. Write down this information.   Do not use tampons. Do not douche or have sexual intercourse until approved by your caregiver.   Only take over-the-counter or prescription medicines for pain or discomfort as directed by your caregiver.   Do not take aspirin. Aspirin can cause bleeding.   Keep all follow-up appointments with your caregiver.   If you or your partner have problems with grieving, talk to your caregiver or seek counseling to help cope with the pregnancy loss. Allow enough time to grieve before trying to get pregnant again.  SEEK IMMEDIATE MEDICAL CARE IF:   You have severe cramps or pain in your back or abdomen.  You have a fever.  You pass large blood clots (walnut-sized   or larger) ortissue from your vagina. Save any tissue for your caregiver to inspect.   Your bleeding increases.   You have a thick, bad-smelling vaginal discharge.  You become lightheaded, weak, or you faint.   You have chills.  MAKE SURE YOU:  Understand these instructions.  Will watch your condition.  Will get help right away if you are not doing well or get  worse. Document Released: 12/25/2000 Document Revised: 12/31/2011 Document Reviewed: 08/20/2011 [employer] Patient Information 2013 [employer].  

## 2012-11-02 NOTE — Progress Notes (Signed)
Patient ID: [name], female   DOB: 06/09/1981, 32 y.o.   MRN: [id number]  Chief Complaint  Patient presents with  . Follow-up    SAB    HPI [name] is a 32 y.o. female.  [id number] Patient's last menstrual period was 08/31/2012. Patient had spontaneous miscarriage 10/13/12, was to have beta hcg last week but no result in system. No complaints, wants to conceive  HPI  Past Medical History  Diagnosis Date  . Herniated disc   . Shoulder dislocation, recurrent     History reviewed. No pertinent past surgical history.  Family History  Problem Relation Age of Onset  . COPD Mother   . Drug abuse Mother   . Alcohol abuse Mother   . Hypertension Mother   . Arthritis Father   . Diabetes Father   . Alcohol abuse Father   . Drug abuse Father   . Hypertension Father     Social History History  Substance Use Topics  . Smoking status: Former Games developer  . Smokeless tobacco: Not on file  . Alcohol Use: No    No Known Allergies  Current Outpatient Prescriptions  Medication Sig Dispense Refill  . aspirin-acetaminophen-caffeine (EXCEDRIN MIGRAINE) 250-250-65 MG per tablet Take 1 tablet by mouth every 6 (six) hours as needed for pain. For headache      . HYDROcodone-acetaminophen (NORCO/VICODIN) 5-325 MG per tablet Take 1 tablet by mouth every 6 (six) hours as needed for pain.  30 tablet  0  . ibuprofen (ADVIL,MOTRIN) 800 MG tablet Take 1 tablet (800 mg total) by mouth every 8 (eight) hours as needed for pain.  60 tablet  1  . oxyCODONE-acetaminophen (PERCOCET/ROXICET) 5-325 MG per tablet Take 1-2 tablets by mouth every 6 (six) hours as needed for pain.  20 tablet  0   No current facility-administered medications for this visit.    Review of Systems Review of Systems  Constitutional: Negative for fever.  Genitourinary: Negative for vaginal bleeding, vaginal discharge, menstrual problem and pelvic pain.    Blood pressure 129/80, pulse 81, temperature 97.1 F (36.2 C),  temperature source Oral, height 5\' 10"  (1.778 m), weight 177 lb 6.4 oz (80.468 kg), last menstrual period 08/31/2012.  Physical Exam Physical Exam  Constitutional: She appears well-developed and well-nourished. No distress.  Abdominal: She exhibits no distension. There is no tenderness.  Neurological: She is alert.  Skin: Skin is warm and dry.  Psychiatric: She has a normal mood and affect. Her behavior is normal.    Data Reviewed ED visits, labs  Assessment    Complete Sab     Plan    HCG today        Ardyth Kelso 11/02/2012, 5:23 PM

## 2012-11-03 ENCOUNTER — Telehealth: Payer: Self-pay | Admitting: *Deleted

## 2012-11-03 NOTE — Telephone Encounter (Addendum)
Message copied by Jill Side on Tue Nov 03, 2012  2:28 PM ------      Message from: Adam Phenix      Created: Tue Nov 03, 2012  2:10 PM       Low result, no repeat needed ------ Called pt and informed her of pregnancy hormone test result and no need for additional testing. Pt states she desires pregnancy in the very near future. She was advised to use condoms to prevent pregnancy until after atleast 1 normal menstrual cycle. Pt stated that she has received word from her case worker that her medicaid has been approved. She wants to schedule Gyn appt for annual exam. She has Hx of abnormal Pap in 2012 and did not have appropriate follow up. I advised pt that one of our scheduling staff will contact her later this week with appt information. Pt voiced understanding.

## 2012-12-03 ENCOUNTER — Ambulatory Visit: Payer: Self-pay | Admitting: Obstetrics & Gynecology

## 2013-03-11 ENCOUNTER — Emergency Department (HOSPITAL_COMMUNITY)
Admission: EM | Admit: 2013-03-11 | Discharge: 2013-03-12 | Disposition: A | Discharge status: Home / Self Care | Payer: Medicaid Other | Attending: Emergency Medicine | Admitting: Emergency Medicine

## 2013-03-11 DIAGNOSIS — Z8739 Personal history of other diseases of the musculoskeletal system and connective tissue: Secondary | ICD-10-CM | POA: Insufficient documentation

## 2013-03-11 DIAGNOSIS — Z87891 Personal history of nicotine dependence: Secondary | ICD-10-CM | POA: Insufficient documentation

## 2013-03-11 DIAGNOSIS — R21 Rash and other nonspecific skin eruption: Secondary | ICD-10-CM | POA: Insufficient documentation

## 2013-03-11 DIAGNOSIS — L237 Allergic contact dermatitis due to plants, except food: Secondary | ICD-10-CM

## 2013-03-11 DIAGNOSIS — L255 Unspecified contact dermatitis due to plants, except food: Secondary | ICD-10-CM | POA: Insufficient documentation

## 2013-03-11 MED ORDER — DEXAMETHASONE SODIUM PHOSPHATE 10 MG/ML IJ SOLN
10.0000 mg | Freq: Once | INTRAMUSCULAR | Status: AC
  Administered 2013-03-11: 10 mg via INTRAMUSCULAR
  Filled 2013-03-11: qty 1

## 2013-03-11 NOTE — ED Notes (Signed)
Pt c/o poison ivy rash to L forearm and back of legs. Started about a day ago. Pt reports that she is highly allergic to poison ivy. Pt with no acute distress. Pt ambulatory to exam room with steady gait.

## 2013-03-11 NOTE — ED Provider Notes (Signed)
CSN: [id number]     Arrival date & time 03/11/13  2131 History  This chart was scribed for Ivonne Andrew, PA working with Gilda Crease, MD by Quintella Reichert, ED Scribe. This patient was seen in room WTR9/WTR9 and the patient's care was started at 11:30 PM.    Chief Complaint  Patient presents with  . Rash    The history is provided by the patient. No language interpreter was used.    HPI Comments: [name] is a 32 y.o. female with no  who presents to the Emergency Department complaining of gradually-onset, gradually-worsening rash to her left forearm that began yesterday.  Pt reports that she was outside playing with her new puppy prior to noticing the rash.   She does have poison ivy or poison oak in her back yard, although she is not certain of which.  She notes that it started at one area on her left forearm and then spread throughout the forearm.  She describes the area as red, raised and slightly dry.  She also notes one small spot on her right forearm.  She has attempted to treat symptoms with topical ointment, without relief.    Past Medical History  Diagnosis Date  . Herniated disc   . Shoulder dislocation, recurrent     No past surgical history on file.   Family History  Problem Relation Age of Onset  . COPD Mother   . Drug abuse Mother   . Alcohol abuse Mother   . Hypertension Mother   . Arthritis Father   . Diabetes Father   . Alcohol abuse Father   . Drug abuse Father   . Hypertension Father     History  Substance Use Topics  . Smoking status: Former Games developer  . Smokeless tobacco: Not on file  . Alcohol Use: No    OB History   Grav Para Term Preterm Abortions TAB SAB Ect Mult Living   7 2 2  0 5 4 1   2        Review of Systems  Skin: Positive for rash.  All other systems reviewed and are negative.      Allergies  Review of patient's allergies indicates no known allergies.  Home Medications   Current Outpatient Rx  Name   Route  Sig  Dispense  Refill  . aspirin-acetaminophen-caffeine (EXCEDRIN MIGRAINE) 250-250-65 MG per tablet   Oral   Take 1 tablet by mouth every 6 (six) hours as needed for pain. For headache         . naproxen sodium (ANAPROX) 220 MG tablet   Oral   Take 220 mg by mouth 2 (two) times daily as needed. For pain          BP 115/70  Pulse 90  Temp(Src) 98.9 F (37.2 C) (Oral)  Resp 18  SpO2 98%  Physical Exam  Nursing note and vitals reviewed. Constitutional: She is oriented to person, place, and time. She appears well-developed and well-nourished. No distress.  HENT:  Head: Normocephalic and atraumatic.  Eyes: EOM are normal.  Neck: Neck supple. No tracheal deviation present.  Cardiovascular: Normal rate.   Pulmonary/Chest: Effort normal. No respiratory distress.  Musculoskeletal: Normal range of motion.  Neurological: She is alert and oriented to person, place, and time.  Skin: Skin is warm and dry. Rash noted. Rash is not vesicular.  Very mild erythematous rash to the left forearm with dry plaques and some papules.  No vesicles or bulla.  Psychiatric:  She has a normal mood and affect. Her behavior is normal.    ED Course  Procedures   DIAGNOSTIC STUDIES: Oxygen Saturation is 98% on room air, normal by my interpretation.    COORDINATION OF CARE: 11:33 PM: Discussed treatment plan which includes Benadryl, Decadron and cool compresses as need.  Advised pt to wash her dog.  Pt expressed understanding and agreed to plan.   Labs Review Labs Reviewed - No data to display  Imaging Review No results found.  MDM   1. Contact dermatitis due to poison ivy      Patient seen and evaluated. Very mild contact dermatitis to left forearm.    I personally performed the services described in this documentation, which was scribed in my presence. The recorded information has been reviewed and is accurate.   Angus Seller, PA-C 03/12/13 0422

## 2013-03-12 NOTE — ED Provider Notes (Signed)
Medical screening examination/treatment/procedure(s) were performed by non-physician practitioner and as supervising physician I was immediately available for consultation/collaboration.  Merrill Deanda M Ervin Hensley, MD 03/12/13 0443 

## 2013-03-31 ENCOUNTER — Emergency Department (HOSPITAL_COMMUNITY)
Admission: EM | Admit: 2013-03-31 | Discharge: 2013-03-31 | Disposition: A | Discharge status: Home / Self Care | Payer: Medicaid Other | Attending: Emergency Medicine | Admitting: Emergency Medicine

## 2013-03-31 ENCOUNTER — Encounter (HOSPITAL_COMMUNITY): Payer: Self-pay | Admitting: Emergency Medicine

## 2013-03-31 DIAGNOSIS — Z23 Encounter for immunization: Secondary | ICD-10-CM | POA: Insufficient documentation

## 2013-03-31 DIAGNOSIS — Y929 Unspecified place or not applicable: Secondary | ICD-10-CM | POA: Insufficient documentation

## 2013-03-31 DIAGNOSIS — Y990 Civilian activity done for income or pay: Secondary | ICD-10-CM | POA: Insufficient documentation

## 2013-03-31 DIAGNOSIS — W292XXA Contact with other powered household machinery, initial encounter: Secondary | ICD-10-CM | POA: Insufficient documentation

## 2013-03-31 DIAGNOSIS — Y9389 Activity, other specified: Secondary | ICD-10-CM | POA: Insufficient documentation

## 2013-03-31 DIAGNOSIS — S6992XA Unspecified injury of left wrist, hand and finger(s), initial encounter: Secondary | ICD-10-CM

## 2013-03-31 DIAGNOSIS — Z8739 Personal history of other diseases of the musculoskeletal system and connective tissue: Secondary | ICD-10-CM | POA: Insufficient documentation

## 2013-03-31 DIAGNOSIS — Z87891 Personal history of nicotine dependence: Secondary | ICD-10-CM | POA: Insufficient documentation

## 2013-03-31 DIAGNOSIS — S61209A Unspecified open wound of unspecified finger without damage to nail, initial encounter: Secondary | ICD-10-CM | POA: Insufficient documentation

## 2013-03-31 MED ORDER — TETANUS-DIPHTH-ACELL PERTUSSIS 5-2.5-18.5 LF-MCG/0.5 IM SUSP
0.5000 mL | Freq: Once | INTRAMUSCULAR | Status: AC
  Administered 2013-03-31: 0.5 mL via INTRAMUSCULAR
  Filled 2013-03-31: qty 0.5

## 2013-03-31 MED ORDER — TRAMADOL HCL 50 MG PO TABS
50.0000 mg | ORAL_TABLET | Freq: Once | ORAL | Status: AC
  Administered 2013-03-31: 50 mg via ORAL
  Filled 2013-03-31: qty 1

## 2013-03-31 NOTE — ED Notes (Signed)
Pt c/o l ring finger lac x1 day. Reports injury was at work today. Bleeding controlled.

## 2013-03-31 NOTE — ED Provider Notes (Signed)
Medical screening examination/treatment/procedure(s) were performed by non-physician practitioner and as supervising physician I was immediately available for consultation/collaboration.   Glynn Octave, MD 03/31/13 [phone]

## 2013-03-31 NOTE — ED Provider Notes (Signed)
CSN: [id number]     Arrival date & time 03/31/13  1656 History  This chart was scribed for non-physician practitioner Emilia Beck, PA-C working with Glynn Octave, MD by Leone Payor, ED Scribe. This patient was seen in room WTR7/WTR7 and the patient's care was started at 1656.    Chief Complaint  Patient presents with  . Finger Injury    The history is provided by the patient. No language interpreter was used.   HPI Comments: [name] is a 32 y.o. female who presents to the Emergency Department complaining of finger laceration on left ring finger that occurred 1 day ago. Pt states that she was using a rotating slicer when incident occurred. Pt last Tetanus shot is unkown. Bleeding is currently controlled. Pt denies numbness.   Past Medical History  Diagnosis Date  . Herniated disc   . Shoulder dislocation, recurrent    History reviewed. No pertinent past surgical history. Family History  Problem Relation Age of Onset  . COPD Mother   . Drug abuse Mother   . Alcohol abuse Mother   . Hypertension Mother   . Arthritis Father   . Diabetes Father   . Alcohol abuse Father   . Drug abuse Father   . Hypertension Father    History  Substance Use Topics  . Smoking status: Former Games developer  . Smokeless tobacco: Not on file  . Alcohol Use: No   OB History   Grav Para Term Preterm Abortions TAB SAB Ect Mult Living   7 2 2  0 5 4 1   2      Review of Systems  Skin: Positive for wound (Finger Laceration).  Neurological: Negative for numbness.  All other systems reviewed and are negative.    Allergies  Review of patient's allergies indicates no known allergies.  Home Medications   Current Outpatient Rx  Name  Route  Sig  Dispense  Refill  . aspirin-acetaminophen-caffeine (EXCEDRIN MIGRAINE) 250-250-65 MG per tablet   Oral   Take 1 tablet by mouth every 6 (six) hours as needed for pain. For headache         . naproxen sodium (ANAPROX) 220 MG tablet   Oral    Take 220 mg by mouth 2 (two) times daily as needed. For pain          Triage Vitals: BP 132/68  Pulse 70  Temp(Src) 98.8 F (37.1 C) (Oral)  Resp 16  SpO2 100%  LMP 03/29/2013 Physical Exam  Nursing note and vitals reviewed. Constitutional: She is oriented to person, place, and time. She appears well-developed and well-nourished. No distress.  HENT:  Head: Normocephalic.  Eyes: Conjunctivae and EOM are normal.  Cardiovascular: Normal rate.   Sufficent capillary refill to distal finger.   Pulmonary/Chest: Effort normal. No stridor.  Musculoskeletal: Normal range of motion.  Full ROM of all joints of left ring finger. No obvious deformity.  Neurological: She is alert and oriented to person, place, and time.  Skin:  Distal left ring finger has 1 cm laceration that appears to be healing. No bleeding noted.   Psychiatric: She has a normal mood and affect.    ED Course  Procedures (including critical care time) DIAGNOSTIC STUDIES: Oxygen Saturation is 100% on RA, normal by my interpretation.    COORDINATION OF CARE: 5:20 PM Will bandage wound. Discussed treatment plan with pt at bedside and pt agreed to plan.   Labs Review Labs Reviewed - No data to display Imaging Review No  results found.  MDM   1. Injury of ring finger, left, initial encounter     6:30 PM Patient's laceration appears to be healing. Wound cleaned thoroughly and bandage applied with bacitracin. No neurovascular compromise noted.   I personally performed the services described in this documentation, which was scribed in my presence. The recorded information has been reviewed and is accurate.     Emilia Beck, [place] 03/31/13 [phone]

## 2013-05-21 ENCOUNTER — Emergency Department (EMERGENCY_DEPARTMENT_HOSPITAL)
Admission: EM | Admit: 2013-05-21 | Discharge: 2013-05-22 | Disposition: A | Discharge status: Other Acute Inpatient Hospital | Payer: Medicaid Other | Source: Home / Self Care | Attending: Emergency Medicine | Admitting: Emergency Medicine

## 2013-05-21 ENCOUNTER — Encounter (HOSPITAL_COMMUNITY): Payer: Self-pay | Admitting: Emergency Medicine

## 2013-05-21 DIAGNOSIS — F1120 Opioid dependence, uncomplicated: Secondary | ICD-10-CM

## 2013-05-21 DIAGNOSIS — Z3202 Encounter for pregnancy test, result negative: Secondary | ICD-10-CM | POA: Insufficient documentation

## 2013-05-21 DIAGNOSIS — Z87891 Personal history of nicotine dependence: Secondary | ICD-10-CM | POA: Insufficient documentation

## 2013-05-21 DIAGNOSIS — Z8739 Personal history of other diseases of the musculoskeletal system and connective tissue: Secondary | ICD-10-CM | POA: Insufficient documentation

## 2013-05-21 LAB — COMPREHENSIVE METABOLIC PANEL WITH GFR
ALT: 53 U/L — ABNORMAL HIGH (ref 0–35)
Alkaline Phosphatase: 39 U/L (ref 39–117)
CO2: 29 meq/L (ref 19–32)
Chloride: 100 meq/L (ref 96–112)
GFR calc Af Amer: >90 mL/min (ref >90)
Glucose, Bld: 158 mg/dL — ABNORMAL HIGH (ref 70–99)
Potassium: 3.7 meq/L (ref 3.5–5.1)
Sodium: 136 meq/L (ref 135–145)
Total Bilirubin: 0.2 mg/dL — ABNORMAL LOW (ref 0.3–1.2)
Total Protein: 6.9 g/dL (ref 6.0–8.3)

## 2013-05-21 LAB — RAPID URINE DRUG SCREEN, HOSP PERFORMED
Amphetamines: NOT DETECTED
Barbiturates: NOT DETECTED
Benzodiazepines: NOT DETECTED
Cocaine: NOT DETECTED
Opiates: POSITIVE — AB
Tetrahydrocannabinol: NOT DETECTED

## 2013-05-21 LAB — CBC
HCT: 34.6 % — ABNORMAL LOW (ref 36.0–46.0)
Hemoglobin: 11.6 g/dL — ABNORMAL LOW (ref 12.0–15.0)
MCH: 29.1 pg (ref 26.0–34.0)
MCHC: 33.5 g/dL (ref 30.0–36.0)
MCV: 86.9 fL (ref 78.0–100.0)
Platelets: 219 10*3/uL (ref 150–400)
RBC: 3.98 MIL/uL (ref 3.87–5.11)
RDW: 13.8 % (ref 11.5–15.5)
WBC: 3.3 K/uL — ABNORMAL LOW (ref 4.0–10.5)

## 2013-05-21 LAB — ETHANOL: Alcohol, Ethyl (B): <11 mg/dL (ref 0–11)

## 2013-05-21 LAB — POCT PREGNANCY, URINE: Preg Test, Ur: NEGATIVE

## 2013-05-21 LAB — COMPREHENSIVE METABOLIC PANEL
AST: 83 U/L — ABNORMAL HIGH (ref 0–37)
Albumin: 3.7 g/dL (ref 3.5–5.2)
BUN: 8 mg/dL (ref 6–23)
Calcium: 10 mg/dL (ref 8.4–10.5)
Creatinine, Ser: 0.51 mg/dL (ref 0.50–1.10)
GFR calc non Af Amer: >90 mL/min (ref >90)

## 2013-05-21 LAB — SALICYLATE LEVEL: Salicylate Lvl: 2.8 mg/dL (ref 2.8–20.0)

## 2013-05-21 LAB — ACETAMINOPHEN LEVEL: Acetaminophen (Tylenol), Serum: <15 ug/mL (ref 10–30)

## 2013-05-21 MED ORDER — ONDANSETRON HCL 4 MG PO TABS: 4.0000 mg | ORAL_TABLET | Freq: Three times a day (TID) | ORAL | Status: DC | PRN

## 2013-05-21 MED ORDER — NICOTINE 21 MG/24HR TD PT24: 21.0000 mg | MEDICATED_PATCH | Freq: Every day | TRANSDERMAL | Status: DC

## 2013-05-21 MED ORDER — LORAZEPAM 1 MG PO TABS: 1.0000 mg | ORAL_TABLET | Freq: Three times a day (TID) | ORAL | Status: DC | PRN

## 2013-05-21 MED ORDER — IBUPROFEN 200 MG PO TABS: 600.0000 mg | ORAL_TABLET | Freq: Three times a day (TID) | ORAL | Status: DC | PRN

## 2013-05-21 MED ORDER — ZOLPIDEM TARTRATE 5 MG PO TABS: 5.0000 mg | ORAL_TABLET | Freq: Every evening | ORAL | Status: DC | PRN

## 2013-05-21 MED ORDER — ALUM & MAG HYDROXIDE-SIMETH 200-200-20 MG/5ML PO SUSP: 30.0000 mL | ORAL | Status: DC | PRN

## 2013-05-21 NOTE — ED Notes (Addendum)
Pt. And belongings searched and wanded by security. Pt. Has 1 belongings bag and blue and red [employer] bag. Pt. Has pant, shoes, socks, underwear, cell phone, money($25 dollars), yellow jacket, sweat shirt, hair products, and hair brush and belly ring. Pt. Belongings locked up in [facility] [address] in the TCU near the psych-ed. Pt. Blue and red [employer] bag locked up in activity room.

## 2013-05-21 NOTE — ED Notes (Signed)
Pt transferred from triage, presents for detox from Heroin, pt clean approx. 355 days then relapsed.  Pt sad, tearful, feeling hopeless.  Denies SI, HI or AV hallucinations.  Pt calm & cooperative at present.

## 2013-05-21 NOTE — ED Notes (Signed)
Pt presents to the ED with a need for medical clearance.  Pt is detoxing from heroin.  Pt's last use was 1330 today.

## 2013-05-21 NOTE — ED Provider Notes (Signed)
CSN: [id number]     Arrival date & time 05/21/13  2059 History  This chart was scribed for non-physician practitioner Ivonne Andrew, PA-C working with Gavin Pound. Oletta Lamas, MD by Caryn Bee, ED Scribe. This patient was seen in room WTR3/WLPT3 and the patient's care was started at 10:39 PM.    Chief Complaint  Patient presents with  . Medical Clearance   HPI HPI Comments: [name] is a 32 y.o. female who presents to the Emergency Department requesting heroin detox. Pt states that she had been to detox previously and was clean for 354 days. She says she isn't sure why she began using heroin again. She has been using for about 1 month. Pt uses needles. She reports feeling depressed and like she doesn't have any hope left. Denies SI or HI. She denies skin redness, fever, chills. Pt does not use alcohol or any other illicit drugs. No other complaints.   Past Medical History  Diagnosis Date  . Herniated disc   . Shoulder dislocation, recurrent    History reviewed. No pertinent past surgical history. Family History  Problem Relation Age of Onset  . COPD Mother   . Drug abuse Mother   . Alcohol abuse Mother   . Hypertension Mother   . Arthritis Father   . Diabetes Father   . Alcohol abuse Father   . Drug abuse Father   . Hypertension Father    History  Substance Use Topics  . Smoking status: Former Games developer  . Smokeless tobacco: Not on file  . Alcohol Use: Yes   OB History   Grav Para Term Preterm Abortions TAB SAB Ect Mult Living   7 2 2  0 5 4 1   2      Review of Systems  Constitutional: Negative for fever and chills.  Skin: Negative for color change.  All other systems reviewed and are negative.    Allergies  Review of patient's allergies indicates no known allergies.  Home Medications   Current Outpatient Rx  Name  Route  Sig  Dispense  Refill  . Aspirin-Acetaminophen-Caffeine (GOODYS EXTRA STRENGTH) 260-130-16 MG TABS   Oral   Take 1 tablet by mouth 2 (two)  times daily as needed (pain).         Marland Kitchen ibuprofen (ADVIL,MOTRIN) 200 MG tablet   Oral   Take 200 mg by mouth every 6 (six) hours as needed for pain.          Triage Vitals: BP 131/76  Pulse 74  Temp(Src) 98.6 F (37 C) (Oral)  Resp 16  SpO2 100%  LMP 04/23/2013  Physical Exam  Nursing note and vitals reviewed. Constitutional: She is oriented to person, place, and time. She appears well-developed and well-nourished. No distress.  HENT:  Head: Normocephalic and atraumatic.  Eyes: EOM are normal.  Neck: Neck supple. No tracheal deviation present.  Cardiovascular: Normal rate, regular rhythm and normal heart sounds.  Exam reveals no gallop and no friction rub.   No murmur heard. Pulmonary/Chest: Effort normal and breath sounds normal. No respiratory distress. She has no wheezes. She has no rales. She exhibits no tenderness.  Musculoskeletal: Normal range of motion.  Neurological: She is alert and oriented to person, place, and time.  Skin: Skin is warm and dry.  Psychiatric: She has a normal mood and affect. Her behavior is normal.    ED Course  Procedures  DIAGNOSTIC STUDIES: Oxygen Saturation is 100% on room air, normal by my interpretation.  COORDINATION OF CARE: 10:42 PM-Discussed treatment plan with pt at bedside and pt agreed to plan.   Psychiatric holding orders in place. TTS consult in place.  Results for orders placed during the hospital encounter of 05/21/13  ACETAMINOPHEN LEVEL      Result Value Range   Acetaminophen (Tylenol), Serum <15.0  10 - 30 ug/mL  CBC      Result Value Range   WBC 3.3 (*) 4.0 - 10.5 K/uL   RBC 3.98  3.87 - 5.11 MIL/uL   Hemoglobin 11.6 (*) 12.0 - 15.0 g/dL   HCT 14.7 (*) 82.9 - 56.2 %   MCV 86.9  78.0 - 100.0 fL   MCH 29.1  26.0 - 34.0 pg   MCHC 33.5  30.0 - 36.0 g/dL   RDW 13.0  86.5 - 78.4 %   Platelets 219  150 - 400 K/uL  COMPREHENSIVE METABOLIC PANEL      Result Value Range   Sodium 136  135 - 145 mEq/L   Potassium  3.7  3.5 - 5.1 mEq/L   Chloride 100  96 - 112 mEq/L   CO2 29  19 - 32 mEq/L   Glucose, Bld 158 (*) 70 - 99 mg/dL   BUN 8  6 - 23 mg/dL   Creatinine, Ser 6.96  0.50 - 1.10 mg/dL   Calcium 29.5  8.4 - 28.4 mg/dL   Total Protein 6.9  6.0 - 8.3 g/dL   Albumin 3.7  3.5 - 5.2 g/dL   AST 83 (*) 0 - 37 U/L   ALT 53 (*) 0 - 35 U/L   Alkaline Phosphatase 39  39 - 117 U/L   Total Bilirubin 0.2 (*) 0.3 - 1.2 mg/dL   GFR calc non Af Amer >90  >90 mL/min   GFR calc Af Amer >90  >90 mL/min  ETHANOL      Result Value Range   Alcohol, Ethyl (B) <11  0 - 11 mg/dL  SALICYLATE LEVEL      Result Value Range   Salicylate Lvl 2.8  2.8 - 20.0 mg/dL  URINE RAPID DRUG SCREEN (HOSP PERFORMED)      Result Value Range   Opiates POSITIVE (*) NONE DETECTED   Cocaine NONE DETECTED  NONE DETECTED   Benzodiazepines NONE DETECTED  NONE DETECTED   Amphetamines NONE DETECTED  NONE DETECTED   Tetrahydrocannabinol NONE DETECTED  NONE DETECTED   Barbiturates NONE DETECTED  NONE DETECTED  POCT PREGNANCY, URINE      Result Value Range   Preg Test, Ur NEGATIVE  NEGATIVE       MDM   1. Heroin dependence     I personally performed the services described in this documentation, which was scribed in my presence. The recorded information has been reviewed and is accurate.    Angus Seller, PA-C 05/22/13 [phone]

## 2013-05-22 ENCOUNTER — Inpatient Hospital Stay (HOSPITAL_COMMUNITY)
Admission: AD | Admit: 2013-05-22 | Discharge: 2013-05-26 | DRG: 897 | Disposition: A | Discharge status: Home / Self Care | Payer: Medicaid Other | Source: Intra-hospital | Attending: Psychiatry | Admitting: Psychiatry

## 2013-05-22 ENCOUNTER — Encounter (HOSPITAL_COMMUNITY): Payer: Self-pay | Admitting: *Deleted

## 2013-05-22 DIAGNOSIS — F0630 Mood disorder due to known physiological condition, unspecified: Secondary | ICD-10-CM

## 2013-05-22 DIAGNOSIS — F3289 Other specified depressive episodes: Secondary | ICD-10-CM

## 2013-05-22 DIAGNOSIS — F329 Major depressive disorder, single episode, unspecified: Secondary | ICD-10-CM

## 2013-05-22 DIAGNOSIS — F1120 Opioid dependence, uncomplicated: Secondary | ICD-10-CM

## 2013-05-22 DIAGNOSIS — F1994 Other psychoactive substance use, unspecified with psychoactive substance-induced mood disorder: Secondary | ICD-10-CM

## 2013-05-22 MED ORDER — NAPROXEN 500 MG PO TABS
500.0000 mg | ORAL_TABLET | Freq: Two times a day (BID) | ORAL | Status: DC | PRN
  Administered 2013-05-22: 500 mg via ORAL
  Filled 2013-05-22: qty 1

## 2013-05-22 MED ORDER — DICYCLOMINE HCL 20 MG PO TABS: 20.0000 mg | ORAL_TABLET | Freq: Four times a day (QID) | ORAL | Status: DC | PRN

## 2013-05-22 MED ORDER — ONDANSETRON 4 MG PO TBDP: 4.0000 mg | ORAL_TABLET | Freq: Four times a day (QID) | ORAL | Status: DC | PRN

## 2013-05-22 MED ORDER — NICOTINE 21 MG/24HR TD PT24
21.0000 mg | MEDICATED_PATCH | Freq: Every day | TRANSDERMAL | Status: DC
  Filled 2013-05-22 (×3): qty 1

## 2013-05-22 MED ORDER — IBUPROFEN 600 MG PO TABS
600.0000 mg | ORAL_TABLET | Freq: Three times a day (TID) | ORAL | Status: DC | PRN
  Administered 2013-05-23 – 2013-05-26 (×7): 600 mg via ORAL
  Filled 2013-05-22 (×7): qty 1

## 2013-05-22 MED ORDER — ALUM & MAG HYDROXIDE-SIMETH 200-200-20 MG/5ML PO SUSP: 30.0000 mL | ORAL | Status: DC | PRN

## 2013-05-22 MED ORDER — LORAZEPAM 1 MG PO TABS
ORAL_TABLET | ORAL | Status: AC
  Administered 2013-05-22: 18:00:00
  Filled 2013-05-22: qty 1

## 2013-05-22 MED ORDER — ONDANSETRON HCL 4 MG PO TABS: 4.0000 mg | ORAL_TABLET | Freq: Three times a day (TID) | ORAL | Status: DC | PRN

## 2013-05-22 MED ORDER — HYDROXYZINE HCL 25 MG PO TABS
ORAL_TABLET | ORAL | Status: AC
  Administered 2013-05-22: 25 mg
  Filled 2013-05-22: qty 1

## 2013-05-22 MED ORDER — HYDROXYZINE HCL 25 MG PO TABS
25.0000 mg | ORAL_TABLET | Freq: Four times a day (QID) | ORAL | Status: DC | PRN
  Administered 2013-05-24 – 2013-05-25 (×2): 25 mg via ORAL
  Filled 2013-05-22 (×2): qty 1

## 2013-05-22 MED ORDER — METHOCARBAMOL 500 MG PO TABS
500.0000 mg | ORAL_TABLET | Freq: Three times a day (TID) | ORAL | Status: DC | PRN
  Administered 2013-05-22 – 2013-05-25 (×6): 500 mg via ORAL
  Filled 2013-05-22 (×6): qty 1

## 2013-05-22 MED ORDER — LORAZEPAM 1 MG PO TABS
1.0000 mg | ORAL_TABLET | Freq: Three times a day (TID) | ORAL | Status: DC | PRN
  Administered 2013-05-23 – 2013-05-26 (×8): 1 mg via ORAL
  Filled 2013-05-22 (×8): qty 1

## 2013-05-22 MED ORDER — ZOLPIDEM TARTRATE 5 MG PO TABS
5.0000 mg | ORAL_TABLET | Freq: Every evening | ORAL | Status: DC | PRN
  Administered 2013-05-23 – 2013-05-25 (×3): 5 mg via ORAL
  Filled 2013-05-22 (×3): qty 1

## 2013-05-22 MED ORDER — LOPERAMIDE HCL 2 MG PO CAPS: 2.0000 mg | ORAL_CAPSULE | ORAL | Status: DC | PRN

## 2013-05-22 MED ORDER — METHOCARBAMOL 500 MG PO TABS
500.0000 mg | ORAL_TABLET | Freq: Three times a day (TID) | ORAL | Status: DC | PRN
  Administered 2013-05-22: 500 mg via ORAL
  Filled 2013-05-22: qty 1

## 2013-05-22 MED ORDER — NAPROXEN 500 MG PO TABS: 500.0000 mg | ORAL_TABLET | Freq: Two times a day (BID) | ORAL | Status: DC | PRN

## 2013-05-22 MED ORDER — ACETAMINOPHEN 325 MG PO TABS: 650.0000 mg | ORAL_TABLET | Freq: Four times a day (QID) | ORAL | Status: DC | PRN

## 2013-05-22 MED ORDER — HYDROXYZINE HCL 25 MG PO TABS
25.0000 mg | ORAL_TABLET | Freq: Four times a day (QID) | ORAL | Status: DC | PRN
  Administered 2013-05-22: 25 mg via ORAL
  Filled 2013-05-22: qty 1

## 2013-05-22 MED ORDER — MAGNESIUM HYDROXIDE 400 MG/5ML PO SUSP: 30.0000 mL | Freq: Every day | ORAL | Status: DC | PRN

## 2013-05-22 NOTE — ED Notes (Signed)
Psych MD adn Julieanne Cotton NP into see

## 2013-05-22 NOTE — BH Assessment (Signed)
Assessment Note  Patient is a 32 year old white female who presents to the Emergency Department requesting heroin detox. Patient's last use was yesterday at 1:30 p.m.   Patient reports that she used .6 grams of heroin.  Patient reports that she relapsed for weeks ago.  Patient reports the first time that she used heroine was 4 and half years ago.  Patient UDS was positive for opiates and her [place] is <11.   Patient reports that she has been using a gram of heroine intravenously daily.  Patient denies aby current withdraw symptoms due to the last time she used.  Patient reports having 345 says clean.  Patient reports that she isn't sure why she began using heroin again. Patient reports a past history of detox and treatment at [facility] and [facility] in 2013.    She reports feeling depressed and, "like she doesn't have any hope left" Patient reports feelings of helplessness.  Patient reports that when she experiences withdrawal symptoms she feels as if she wants to die.   Patient denies having a plan to harm herself.   Patient denies HI.  Patient denies psychosis.  Patient denies prior psychiatric hospitalizations.  Patient denies medication .    Axis I: Opiate Dependence; Major Depressive Disorder  Axis II: Deferred Axis III:  Past Medical History  Diagnosis Date  . Herniated disc   . Shoulder dislocation, recurrent    Axis IV: economic problems, occupational problems, other psychosocial or environmental problems, problems related to social environment and problems with primary support group Axis V: 31-40 impairment in reality testing  Past Medical History:  Past Medical History  Diagnosis Date  . Herniated disc   . Shoulder dislocation, recurrent     History reviewed. No pertinent past surgical history.  Family History:  Family History  Problem Relation Age of Onset  . COPD Mother   . Drug abuse Mother   . Alcohol abuse Mother   . Hypertension Mother   . Arthritis Father   . Diabetes  Father   . Alcohol abuse Father   . Drug abuse Father   . Hypertension Father     Social History:  reports that she has quit smoking. She does not have any smokeless tobacco history on file. She reports that she drinks alcohol. She reports that she uses illicit drugs.  Additional Social History:     CIWA: CIWA-Ar BP: 120/73 mmHg Pulse Rate: 89 COWS:    Allergies: No Known Allergies  Home Medications:  (Not in a hospital admission)  OB/GYN Status:  Patient's last menstrual period was 04/23/2013.  General Assessment Data Location of Assessment: [facility] ED Is this a Tele or Face-to-Face Assessment?: Face-to-Face Is this an Initial Assessment or a Re-assessment for this encounter?: Initial Assessment Living Arrangements: Alone Can pt return to current living arrangement?: Yes Admission Status: Voluntary Is patient capable of signing voluntary admission?: Yes Transfer from: Acute Hospital Referral Source: Self/Family/Friend  Medical Screening Exam [facility] Walk-in ONLY) Medical Exam completed: Yes  [facility] Crisis Care Plan Living Arrangements: Alone  Education Status Is patient currently in school?: No  Risk to self Suicidal Ideation: No Suicidal Intent: No Is patient at risk for suicide?: No Suicidal Plan?: No Access to Means: No What has been your use of drugs/alcohol within the last 12 months?: Heroine Previous Attempts/Gestures: No How many times?: 0 Other Self Harm Risks: None Triggers for Past Attempts: Unpredictable Intentional Self Injurious Behavior: None Family Suicide History: No Recent stressful life event(s): Financial Problems (  She did not pay her November 2014 rent) Persecutory voices/beliefs?: No Depression: Yes Depression Symptoms: Insomnia;Isolating;Tearfulness;Loss of interest in usual pleasures;Feeling worthless/self pity Substance abuse history and/or treatment for substance abuse?: Yes Suicide prevention information given to non-admitted patients: Not  applicable  Risk to Others Homicidal Ideation: No Thoughts of Harm to Others: No Current Homicidal Intent: No Current Homicidal Plan: No Access to Homicidal Means: No Identified Victim: None History of harm to others?: No Assessment of Violence: None Noted Violent Behavior Description: Calm Does patient have access to weapons?: No Criminal Charges Pending?: No Does patient have a court date: No  Psychosis Hallucinations: None noted Delusions: None noted  Mental Status Report Appear/Hygiene: Disheveled Eye Contact: Poor Motor Activity: Freedom of movement Speech: Logical/coherent Level of Consciousness: Alert Mood: Depressed Affect: Blunted Anxiety Level: Minimal Thought Processes: Coherent;Relevant Judgement: Unimpaired Orientation: Person;Place;Time;Situation Obsessive Compulsive Thoughts/Behaviors: None  Cognitive Functioning Concentration: Decreased Memory: Remote Intact;Recent Intact IQ: Average Insight: Fair Impulse Control: Poor Appetite: Fair Weight Loss: 0 Weight Gain: 0 Sleep: Decreased Total Hours of Sleep: 4 Vegetative Symptoms: None  ADLScreening [facility] Assessment Services) Patient's cognitive ability adequate to safely complete daily activities?: Yes Patient able to express need for assistance with ADLs?: Yes Independently performs ADLs?: Yes (appropriate for developmental age)  Prior Inpatient Therapy Prior Inpatient Therapy: No Prior Therapy Dates: na Prior Therapy Facilty/Provider(s): na Reason for Treatment: na  Prior Outpatient Therapy Prior Outpatient Therapy: No Prior Therapy Dates: na Prior Therapy Facilty/Provider(s): na Reason for Treatment: na  ADL Screening (condition at time of admission) Patient's cognitive ability adequate to safely complete daily activities?: Yes Patient able to express need for assistance with ADLs?: Yes Independently performs ADLs?: Yes (appropriate for developmental age)         Values /  Beliefs Cultural Requests During Hospitalization: None Spiritual Requests During Hospitalization: None        Additional Information 1:1 In Past 12 Months?: No CIRT Risk: No Elopement Risk: No Does patient have medical clearance?: Yes     Disposition: Pending psych consult.  Disposition Initial Assessment Completed for this Encounter: Yes Disposition of Patient: Other dispositions Other disposition(s): Other (Comment)  On Site Evaluation by:   Reviewed with Physician:    Phillip Heal LaVerne 05/22/2013 2:05 AM

## 2013-05-22 NOTE — ED Notes (Signed)
Dozing, Feeling better, pain improved

## 2013-05-22 NOTE — Progress Notes (Signed)
This is voluntary admission for this 32 yo caucasian female, who admits to heroin abuse and requests our help to see her through detox. SHe reports being clean, working, maintaining a new home, and most importantly, becoming too busy that she didn't have time to meet with her group. She identifies that choice as being the " one that got me so sick".. She denies known drug allergies, she denies drinking alsohol and she agrees that she is not thinking too well of herself. She is medicated with scheduled ativan, for anxiey and the   robabxin and vistaril for back cramps and  Withdrawal, respectively. She contracts for safety. Admsiion completed and she is escorted to unit.

## 2013-05-22 NOTE — Progress Notes (Signed)
Adult Psychoeducational Group Note  Date:  05/22/2013 Time:  9:19 PM  Group Topic/Focus:  Wrap-Up Group:   The focus of this group is to help patients review their daily goal of treatment and discuss progress on daily workbooks.  Participation Level:  Did Not Attend  Participation Quality:    Affect:    Cognitive:    Insight:  Engagement in Group:    Modes of Intervention:    Additional Comments:    Lorin Mercy 05/22/2013, 9:19 PM

## 2013-05-22 NOTE — Consult Note (Signed)
Pt accepted to [facility] by Julieanne Cotton NP to Dr. Elsie Saas services.  Pt can go by [employer] [phone].

## 2013-05-22 NOTE — Consult Note (Signed)
[facility] Face-to-Face Psychiatry Consult   Reason for Consult:  Detox Referring Physician:  ED Physician  [name] is an 32 y.o. female.  Assessment: AXIS I:  Depressive Disorder NOS, Substance Induced Mood Disorder and Opiate Dependence AXIS II:  Deferred AXIS III:   Past Medical History  Diagnosis Date  . Herniated disc   . Shoulder dislocation, recurrent    AXIS IV:  economic problems, housing problems and occupational problems AXIS V:  41-50 serious symptoms  Plan:  Recommend psychiatric Inpatient admission when medically cleared.  Subjective:   [name] is a 32 y.o. female patient admitted with request for heroine detox. Says has been sober for nearly one year but recently relapsed.  HPI:  Has been using 6grams of heroine. Relapsed a week ago. Remorseful feeling down. Has a son to take care. Says she works and was feeling to use to change her mood. She denies depression or psychotic symptoms. Could not clearly tell any particular reason to relapse.  Has started using heroine intravenously after 345 days clean.  Past history of detox 2013. Says she was feeling down when she started having withdrawals and does not want to go through the same feelings and want detox. HPI Elements:   Location:  hospital. Quality:  moderate. Severity:  one week relapse.  Past Psychiatric History: Past Medical History  Diagnosis Date  . Herniated disc   . Shoulder dislocation, recurrent     reports that she has quit smoking. She does not have any smokeless tobacco history on file. She reports that she drinks alcohol. She reports that she uses illicit drugs. Family History  Problem Relation Age of Onset  . COPD Mother   . Drug abuse Mother   . Alcohol abuse Mother   . Hypertension Mother   . Arthritis Father   . Diabetes Father   . Alcohol abuse Father   . Drug abuse Father   . Hypertension Father    Family History Substance Abuse: Yes, Describe: Family Supports: Yes,  List: Living Arrangements: Alone Can pt return to current living arrangement?: Yes   Allergies:  No Known Allergies  ACT Assessment Complete:  Yes:    Educational Status    Risk to Self: Risk to self Suicidal Ideation: No Suicidal Intent: No Is patient at risk for suicide?: No Suicidal Plan?: No Access to Means: No What has been your use of drugs/alcohol within the last 12 months?: Heroine Previous Attempts/Gestures: No How many times?: 0 Other Self Harm Risks: None Triggers for Past Attempts: Unpredictable Intentional Self Injurious Behavior: None Family Suicide History: No Recent stressful life event(s): Financial Problems (She did not pay her November 2014 rent) Persecutory voices/beliefs?: No Depression: Yes Depression Symptoms: Insomnia;Isolating;Tearfulness;Loss of interest in usual pleasures;Feeling worthless/self pity Substance abuse history and/or treatment for substance abuse?: Yes Suicide prevention information given to non-admitted patients: Not applicable  Risk to Others: Risk to Others Homicidal Ideation: No Thoughts of Harm to Others: No Current Homicidal Intent: No Current Homicidal Plan: No Access to Homicidal Means: No Identified Victim: None History of harm to others?: No Assessment of Violence: None Noted Violent Behavior Description: Calm Does patient have access to weapons?: No Criminal Charges Pending?: No Does patient have a court date: No  Abuse:    Prior Inpatient Therapy: Prior Inpatient Therapy Prior Inpatient Therapy: No Prior Therapy Dates: na Prior Therapy Facilty/Provider(s): na Reason for Treatment: na  Prior Outpatient Therapy: Prior Outpatient Therapy Prior Outpatient Therapy: No Prior Therapy Dates: na  Prior Therapy Facilty/Provider(s): na Reason for Treatment: na  Additional Information: Additional Information 1:1 In Past 12 Months?: No CIRT Risk: No Elopement Risk: No Does patient have medical clearance?: Yes                   Objective: Blood pressure 113/72, pulse 72, temperature 98 F (36.7 C), temperature source Oral, resp. rate 18, last menstrual period 04/23/2013, SpO2 96.00%.There is no weight on file to calculate BMI. Results for orders placed during the hospital encounter of 05/21/13 (from the past 72 hour(s))  ACETAMINOPHEN LEVEL     Status: None   Collection Time    05/21/13  9:30 PM      Result Value Range   Acetaminophen (Tylenol), Serum <15.0  10 - 30 ug/mL   Comment:            THERAPEUTIC CONCENTRATIONS VARY     SIGNIFICANTLY. A RANGE OF 10-30     ug/mL MAY BE AN EFFECTIVE     CONCENTRATION FOR MANY PATIENTS.     HOWEVER, SOME ARE BEST TREATED     AT CONCENTRATIONS OUTSIDE THIS     RANGE.     ACETAMINOPHEN CONCENTRATIONS     >150 ug/mL AT 4 HOURS AFTER     INGESTION AND >50 ug/mL AT 12     HOURS AFTER INGESTION ARE     OFTEN ASSOCIATED WITH TOXIC     REACTIONS.  CBC     Status: Abnormal   Collection Time    05/21/13  9:30 PM      Result Value Range   WBC 3.3 (*) 4.0 - 10.5 K/uL   RBC 3.98  3.87 - 5.11 MIL/uL   Hemoglobin 11.6 (*) 12.0 - 15.0 g/dL   HCT 40.9 (*) 81.1 - 91.4 %   MCV 86.9  78.0 - 100.0 fL   MCH 29.1  26.0 - 34.0 pg   MCHC 33.5  30.0 - 36.0 g/dL   RDW 78.2  95.6 - 21.3 %   Platelets 219  150 - 400 K/uL  COMPREHENSIVE METABOLIC PANEL     Status: Abnormal   Collection Time    05/21/13  9:30 PM      Result Value Range   Sodium 136  135 - 145 mEq/L   Potassium 3.7  3.5 - 5.1 mEq/L   Chloride 100  96 - 112 mEq/L   CO2 29  19 - 32 mEq/L   Glucose, Bld 158 (*) 70 - 99 mg/dL   BUN 8  6 - 23 mg/dL   Creatinine, Ser 0.86  0.50 - 1.10 mg/dL   Calcium 57.8  8.4 - 46.9 mg/dL   Total Protein 6.9  6.0 - 8.3 g/dL   Albumin 3.7  3.5 - 5.2 g/dL   AST 83 (*) 0 - 37 U/L   ALT 53 (*) 0 - 35 U/L   Alkaline Phosphatase 39  39 - 117 U/L   Total Bilirubin 0.2 (*) 0.3 - 1.2 mg/dL   GFR calc non Af Amer >90  >90 mL/min   GFR calc Af Amer >90  >90  mL/min   Comment: (NOTE)     The eGFR has been calculated using the CKD EPI equation.     This calculation has not been validated in all clinical situations.     eGFR's persistently <90 mL/min signify possible Chronic Kidney     Disease.  ETHANOL     Status: None   Collection Time  05/21/13  9:30 PM      Result Value Range   Alcohol, Ethyl (B) <11  0 - 11 mg/dL   Comment:            LOWEST DETECTABLE LIMIT FOR     SERUM ALCOHOL IS 11 mg/dL     FOR MEDICAL PURPOSES ONLY  SALICYLATE LEVEL     Status: None   Collection Time    05/21/13  9:30 PM      Result Value Range   Salicylate Lvl 2.8  2.8 - 20.0 mg/dL  URINE RAPID DRUG SCREEN (HOSP PERFORMED)     Status: Abnormal   Collection Time    05/21/13  9:48 PM      Result Value Range   Opiates POSITIVE (*) NONE DETECTED   Cocaine NONE DETECTED  NONE DETECTED   Benzodiazepines NONE DETECTED  NONE DETECTED   Amphetamines NONE DETECTED  NONE DETECTED   Tetrahydrocannabinol NONE DETECTED  NONE DETECTED   Barbiturates NONE DETECTED  NONE DETECTED   Comment:            DRUG SCREEN FOR MEDICAL PURPOSES     ONLY.  IF CONFIRMATION IS NEEDED     FOR ANY PURPOSE, NOTIFY LAB     WITHIN 5 DAYS.                LOWEST DETECTABLE LIMITS     FOR URINE DRUG SCREEN     Drug Class       Cutoff (ng/mL)     Amphetamine      1000     Barbiturate      200     Benzodiazepine   200     Tricyclics       300     Opiates          300     Cocaine          300     THC              50  POCT PREGNANCY, URINE     Status: None   Collection Time    05/21/13 10:08 PM      Result Value Range   Preg Test, Ur NEGATIVE  NEGATIVE   Comment:            THE SENSITIVITY OF THIS     METHODOLOGY IS >24 mIU/mL   Labs are reviewed and are pertinent for postive Opiates..  Current Facility-Administered Medications  Medication Dose Route Frequency Provider Last Rate Last Dose  . alum & mag hydroxide-simeth (MAALOX/MYLANTA) 200-200-20 MG/5ML suspension 30 mL  30 mL  Oral PRN Phill Mutter Dammen, PA-C      . ibuprofen (ADVIL,MOTRIN) tablet 600 mg  600 mg Oral Q8H PRN Angus Seller, PA-C      . LORazepam (ATIVAN) tablet 1 mg  1 mg Oral Q8H PRN Phill Mutter Dammen, PA-C      . nicotine (NICODERM CQ - dosed in mg/24 hours) patch 21 mg  21 mg Transdermal Daily Peter S Dammen, PA-C      . ondansetron (ZOFRAN) tablet 4 mg  4 mg Oral Q8H PRN Phill Mutter Dammen, PA-C      . zolpidem (AMBIEN) tablet 5 mg  5 mg Oral QHS PRN Angus Seller, PA-C       Current Outpatient Prescriptions  Medication Sig Dispense Refill  . Aspirin-Acetaminophen-Caffeine (GOODYS EXTRA STRENGTH) 260-130-16 MG TABS Take 1 tablet by mouth 2 (two)  times daily as needed (pain).      Marland Kitchen ibuprofen (ADVIL,MOTRIN) 200 MG tablet Take 200 mg by mouth every 6 (six) hours as needed for pain.        Psychiatric Specialty Exam:     Blood pressure 113/72, pulse 72, temperature 98 F (36.7 C), temperature source Oral, resp. rate 18, last menstrual period 04/23/2013, SpO2 96.00%.There is no weight on file to calculate BMI.  General Appearance: Casual and Disheveled  Eye Contact::  Fair  Speech:  Slow  Volume:  Decreased  Mood:  Dysphoric  Affect:  Congruent  Thought Process:  Goal Directed  Orientation:  Full (Time, Place, and Person)  Thought Content:  NA  Suicidal Thoughts:  No  Homicidal Thoughts:  No  Memory:  Recent;   Fair  Judgement:  Impaired  Insight:  Lacking  Psychomotor Activity:  Decreased  Concentration:  Fair  Recall:  Fair  Akathisia:  No  Handed:  Right  AIMS (if indicated):     Assets:  Leisure Time Resilience Social Support Talents/Skills Transportation  Sleep:      Treatment Plan Summary: Daily contact with patient to assess and evaluate symptoms and progress in treatment Medication management Admit to Inpatient for Detox of Opiates. Start Detox protocol with clonidine. Consider antidepressant in hospital.   Gilmore Laroche, Nikalas Bramel 05/22/2013 9:33 AM

## 2013-05-22 NOTE — ED Notes (Signed)
[facility] is here to transport,belongings sent w/ pt

## 2013-05-22 NOTE — ED Notes (Signed)
Psych ED notified [facility] is here for patient transport.

## 2013-05-22 NOTE — ED Provider Notes (Signed)
Medical screening examination/treatment/procedure(s) were performed by non-physician practitioner and as supervising physician I was immediately available for consultation/collaboration.  EKG Interpretation   None         Tresha Muzio Y. Winston Sobczyk, MD 05/22/13 1034 

## 2013-05-23 DIAGNOSIS — R45851 Suicidal ideations: Secondary | ICD-10-CM

## 2013-05-23 DIAGNOSIS — F1994 Other psychoactive substance use, unspecified with psychoactive substance-induced mood disorder: Secondary | ICD-10-CM

## 2013-05-23 MED ORDER — CLONIDINE HCL 0.1 MG PO TABS
0.1000 mg | ORAL_TABLET | Freq: Four times a day (QID) | ORAL | Status: AC
  Administered 2013-05-23 – 2013-05-24 (×6): 0.1 mg via ORAL
  Filled 2013-05-23 (×8): qty 1

## 2013-05-23 MED ORDER — CLONIDINE HCL 0.1 MG PO TABS
0.1000 mg | ORAL_TABLET | ORAL | Status: DC
  Administered 2013-05-25 – 2013-05-26 (×3): 0.1 mg via ORAL
  Filled 2013-05-23 (×4): qty 1

## 2013-05-23 MED ORDER — CLONIDINE HCL 0.1 MG PO TABS
0.1000 mg | ORAL_TABLET | Freq: Every day | ORAL | Status: DC
  Filled 2013-05-23 (×2): qty 1

## 2013-05-23 NOTE — Progress Notes (Signed)
Psychoeducational Group Note  Date: 05/23/2013 Time:  0930  Group Topic/Focus:  Gratefulness:  The focus of this group is to help patients identify what two things they are most grateful for in their lives. What helps ground them and to center them on their work to their recovery.  Participation Level:  Did not attend    Sultana Tierney A   

## 2013-05-23 NOTE — [facility] Suicide Risk Assessment (Signed)
Suicide Risk Assessment  Admission Assessment     Nursing information obtained from:    Demographic factors:    Current Mental Status:    Loss Factors:    Historical Factors:    Risk Reduction Factors:     CLINICAL FACTORS:   Depression:   Impulsivity Recent sense of peace/wellbeing Alcohol/Substance Abuse/Dependencies Chronic Pain Unstable or Poor Therapeutic Relationship Previous Psychiatric Diagnoses and Treatments Medical Diagnoses and Treatments/Surgeries  COGNITIVE FEATURES THAT CONTRIBUTE TO RISK:  Closed-mindedness Loss of executive function Polarized thinking Thought constriction (tunnel vision)    SUICIDE RISK:   Moderate:  Frequent suicidal ideation with limited intensity, and duration, some specificity in terms of plans, no associated intent, good self-control, limited dysphoria/symptomatology, some risk factors present, and identifiable protective factors, including available and accessible social support.  PLAN OF CARE: Admit for opioid dependence and recent relapse and opioid detox treatment program.   I certify that inpatient services furnished can reasonably be expected to improve the patient's condition.   Veryl Abril,JANARDHAHA R. 05/23/2013, 9:53 AM

## 2013-05-23 NOTE — Progress Notes (Signed)
Adult Psychoeducational Group Note  Date:  05/23/2013 Time:  3:53 PM  Group Topic/Focus:  Therapeutic Activity  Participation Level:  Did Not Attend   Additional Comments:  Pts are encouraged to attend all group sessions. Pt stayed in bed to sleep.  Tora Perches N 05/23/2013, 3:53 PM

## 2013-05-23 NOTE — Progress Notes (Signed)
Adult Psychoeducational Group Note  Date:  05/23/2013 Time:  8:00pm Group Topic/Focus:  Wrap-Up Group:   The focus of this group is to help patients review their daily goal of treatment and discuss progress on daily workbooks.  Participation Level:  Did Not Attend  Participation Quality:    Affect:    Cognitive:    Insight:   Engagement in Group:    Modes of Intervention:   Additional Comments:  Pt did not attend group.  Shelly Bombard D 05/23/2013, 10:34 PM

## 2013-05-23 NOTE — Progress Notes (Signed)
Psychoeducational Group Note  Date:  05/23/2013 Time:  1015  Group Topic/Focus:  Making Healthy Choices:   The focus of this group is to help patients identify negative/unhealthy choices they were using prior to admission and identify positive/healthier coping strategies to replace them upon discharge.  Participation Level:  Active  Participation Quality:  Appropriate  Affect:  Appropriate  Cognitive:  Oriented  Insight:  Improving  Engagement in Group:  Engaged  Additional Comments:    [name] A 05/23/2013 

## 2013-05-23 NOTE — Progress Notes (Signed)
D [name] stayed in her bed all morning. She was not able to eat breakfast nor was she able to attend her morning groups. She did not complete her AM self inventory. She does deny suicidality this morning.    A She got OOB and attended her lunch in the cafe' with the rest of the patients. She requested and was given prn ativan and clonodine per MD order. She shares that she feels like her " skin is crawling".    R Safety is in place and poc moves forward.

## 2013-05-23 NOTE — Progress Notes (Signed)
Writer entered patients room and observed her lying in bed sleeping. Patient was easily aroused when her name was called. Writer asked if she was in need of anything and patient reports that she is "just really tired and needs to rest." Writer offered patient snacks which she ate and was informed of medications available if needed. Patient made no request for medications and writer allowed her to continue to rest. Patient denies si/hi/a/v hallucinations. Patient remains safe with 15 min checks. No complaints of  Withdrawal symptoms currently.
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
## 2013-05-23 NOTE — [facility] Group Notes (Signed)
[facility] Group Notes:  (Clinical Social Work)  05/23/2013   1:15-2:20PM  Summary of Progress/Problems:   The main focus of today's process group was to   identify the patient's current support system and decide on other supports that can be put in place.  The picture on workbook was used to discuss why additional supports are needed, then used to talk about how patients have given and received all different kinds of support.  An emphasis was placed on using counselor, doctor, therapy groups, 12-step groups, and problem-specific support groups to expand supports.  The patient expressed full comprehension of the concepts presented, and agreed that there is a need to add more supports.  The patient stated the current supports in place are her parents, boss and NA friends.  She was in and out of the room several times, was not present at the end when the question was posed about what support she may be willing to add.  Type of Therapy:  Process Group  Participation Level:  Active  Participation Quality:  Attentive  Affect:  Anxious  Cognitive:  Appropriate  Insight:  Developing/Improving  Engagement in Therapy:  Improving  Modes of Intervention:  Education,  Support and Processing  Ambrose Mantle, LCSW 05/23/2013, 4:00pm

## 2013-05-23 NOTE — Progress Notes (Signed)
Pt has been in bed all evening. Complaining of withdrawal symptoms with a COWS of a 10. Low grade temp and tachycardic. Generalized pain of a 7/10. Medicated per orders and symptoms. Encouraged to push fluids as tolerated. Given support and reassurance as pt continues to feel guilty, shameful and like a failure. Pt receptive. On reassess, pt is asleep. No SI/HI/AVH verbalized and pt is safe. Lawrence Marseilles

## 2013-05-24 DIAGNOSIS — F1120 Opioid dependence, uncomplicated: Principal | ICD-10-CM

## 2013-05-24 LAB — COMPREHENSIVE METABOLIC PANEL
ALT: 40 U/L — ABNORMAL HIGH (ref 0–35)
AST: 60 U/L — ABNORMAL HIGH (ref 0–37)
BUN: 13 mg/dL (ref 6–23)
CO2: 27 mEq/L (ref 19–32)
Calcium: 9.8 mg/dL (ref 8.4–10.5)
Creatinine, Ser: 0.56 mg/dL (ref 0.50–1.10)
Sodium: 138 mEq/L (ref 135–145)
Total Protein: 7.5 g/dL (ref 6.0–8.3)

## 2013-05-24 LAB — HEPATITIS C ANTIBODY: HCV Ab: REACTIVE — AB

## 2013-05-24 NOTE — Progress Notes (Signed)
Patient ID: [name], female   DOB: 10/12/1980, 32 y.o.   MRN: [id number] D-Patient reports her sleep was fair. Her energy level is low.  Her ability to pay attention is improving and She is rating depression and hopelessness at 5/10.  She continues to experience detox symptoms and generalized pain.  She attended discharge planning group but was not feeling well and then returned to bed.  A- Supported patient and talked with her about her success in staying clean for a year.  Patient says she knows she needs to be strong to regain and maintain her sobriety.

## 2013-05-24 NOTE — Progress Notes (Signed)
D: Patient at the medications window on approach.  Patient cooperative while speaking to Clinical research associate but patient appears to be preoccupied with taking medications.  Patient states, "Give me everything."  Patient states shes having withdrawal symptoms and needs medication.  Patient states, "I started using heroine and I know it was a stupid thing to do but I wanted that rush.  Patient states she is going to go back to her support groups so she can stay clean in the future.  Patient denies SI/HI and denies AVH. A: Staff to monitor Q 15 mins for safety.  Encouragement and support offered.  Scheduled medications administered per orders.  Prn medications administered. R: Patient remains safe on the unit.  Patient attended group tonight.  Pateint visible on the unit and interactig with peers.  Patient taking administered medications.

## 2013-05-24 NOTE — Progress Notes (Signed)
Adult Psychoeducational Group Note  Date:  05/24/2013 Time:  9:14 PM  Group Topic/Focus:  Wrap-Up Group:   The focus of this group is to help patients review their daily goal of treatment and discuss progress on daily workbooks.  Participation Level:  Active  Participation Quality:  Appropriate  Affect:  Appropriate  Cognitive:  Appropriate  Insight: Appropriate  Engagement in Group:  Engaged  Modes of Intervention:  Support  Additional Comments:  Pt says that she had two very important visitors today and that her withdrawals are getting better and that it has been 3 days since she shot up heroin and she is looking forward to staying clean because she likes the way she feels when she is.   [name] 05/24/2013, 9:14 PM

## 2013-05-24 NOTE — [facility] Group Notes (Signed)
[facility] LCSW Group Therapy          Overcoming Obstacles       1:15 -2:30            05/24/2013      Type of Therapy:  Group Therapy  Participation Level:  Did not attend group.  Wynn Banker 05/24/2013

## 2013-05-24 NOTE — Progress Notes (Signed)
[facility] MD Progress Note  05/24/2013 6:05 PM [name]  MRN:  [id number]  Subjective:  Met with patient to discuss her treatment for detox from heroine. She states she has nausea, chills, sweats, muscle cramps, diarrhea, fatigue, joint pain, and fatigue, runny nose and yawning.  Diagnosis:   DSM5: Schizophrenia Disorders:   Obsessive-Compulsive Disorders:   Trauma-Stressor Disorders:   Substance/Addictive Disorders:  Opiate dependence Depressive Disorders:      ADL's:  Intact  Sleep: Good  Appetite:  Good  Suicidal Ideation:  denies Homicidal Ideation:  denies AEB (as evidenced by):  Psychiatric Specialty Exam: Review of Systems  Constitutional: Positive for chills, malaise/fatigue and diaphoresis. Negative for fever and weight loss.  HENT: Negative for congestion and sore throat.   Eyes: Negative for blurred vision, double vision and photophobia.  Respiratory: Negative for cough, shortness of breath and wheezing.   Cardiovascular: Negative for chest pain, palpitations and PND.  Gastrointestinal: Positive for heartburn, nausea, abdominal pain and diarrhea. Negative for vomiting and constipation.  Musculoskeletal: Positive for back pain, joint pain and myalgias. Negative for falls.  Neurological: Positive for weakness. Negative for dizziness, tingling, tremors, sensory change, speech change, focal weakness, seizures, loss of consciousness and headaches.  Endo/Heme/Allergies: Negative for polydipsia. Does not bruise/bleed easily.  Psychiatric/Behavioral: Negative for depression, suicidal ideas, hallucinations, memory loss and substance abuse. The patient is not nervous/anxious and does not have insomnia.     Blood pressure 124/76, pulse 95, temperature 98.1 F (36.7 C), temperature source Oral, resp. rate 18, height 5\' 10"  (1.778 m), weight 79.833 kg (176 lb), last menstrual period 04/23/2013, SpO2 100.00%.Body mass index is 25.25 kg/(m^2).  General Appearance: Disheveled   Eye Contact::  Minimal  Speech:  Clear and Coherent  Volume:  Normal  Mood:  Dysphoric  Affect:  Congruent  Thought Process:  Goal Directed  Orientation:  Full (Time, Place, and Person)  Thought Content:  WDL  Suicidal Thoughts:  No  Homicidal Thoughts:  No  Memory:  NA  Judgement:  Intact  Insight:  Present  Psychomotor Activity:  Decreased  Concentration:  Fair  Recall:  Fair  Akathisia:  No  Handed:  Right  AIMS (if indicated):     Assets:  Desire for Improvement  Sleep:  Number of Hours: 6.75   Current Medications: Current Facility-Administered Medications  Medication Dose Route Frequency Provider Last Rate Last Dose  . acetaminophen (TYLENOL) tablet 650 mg  650 mg Oral Q6H PRN Earney Navy, NP      . alum & mag hydroxide-simeth (MAALOX/MYLANTA) 200-200-20 MG/5ML suspension 30 mL  30 mL Oral Q4H PRN Earney Navy, NP      . cloNIDine (CATAPRES) tablet 0.1 mg  0.1 mg Oral QID Nehemiah Settle, MD   0.1 mg at 05/24/13 1720   Followed by  . [START ON 05/25/2013] cloNIDine (CATAPRES) tablet 0.1 mg  0.1 mg Oral BH-qamhs Nehemiah Settle, MD       Followed by  . [START ON 05/27/2013] cloNIDine (CATAPRES) tablet 0.1 mg  0.1 mg Oral QAC breakfast Nehemiah Settle, MD      . dicyclomine (BENTYL) tablet 20 mg  20 mg Oral Q6H PRN Earney Navy, NP      . hydrOXYzine (ATARAX/VISTARIL) tablet 25 mg  25 mg Oral Q6H PRN Earney Navy, NP   25 mg at 05/24/13 1723  . ibuprofen (ADVIL,MOTRIN) tablet 600 mg  600 mg Oral Q8H PRN Earney Navy, NP   600  mg at 05/24/13 0840  . loperamide (IMODIUM) capsule 2-4 mg  2-4 mg Oral PRN Earney Navy, NP      . LORazepam (ATIVAN) tablet 1 mg  1 mg Oral Q8H PRN Earney Navy, NP   1 mg at 05/24/13 0840  . magnesium hydroxide (MILK OF MAGNESIA) suspension 30 mL  30 mL Oral Daily PRN Earney Navy, NP      . methocarbamol (ROBAXIN) tablet 500 mg  500 mg Oral Q8H PRN Earney Navy, NP    500 mg at 05/24/13 0840  . ondansetron (ZOFRAN) tablet 4 mg  4 mg Oral Q8H PRN Earney Navy, NP      . ondansetron (ZOFRAN-ODT) disintegrating tablet 4 mg  4 mg Oral Q6H PRN Earney Navy, NP      . zolpidem (AMBIEN) tablet 5 mg  5 mg Oral QHS PRN Earney Navy, NP   5 mg at 05/23/13 2129    Lab Results:  Results for orders placed during the hospital encounter of 05/22/13 (from the past 48 hour(s))  HEPATITIS C ANTIBODY     Status: Abnormal   Collection Time    05/24/13  6:31 AM      Result Value Range   HCV Ab Reactive (*) NEGATIVE   Comment: (NOTE)                                                                               This test is for screening purposes only.  Reactive results should be     confirmed by an alternative method.  Suggest HCV Qualitative, PCR,     test code 45409.  Specimens will be stable for reflex testing up to 3     days after collection.     Performed at [facility]  COMPREHENSIVE METABOLIC PANEL     Status: Abnormal   Collection Time    05/24/13  6:31 AM      Result Value Range   Sodium 138  135 - 145 mEq/L   Potassium 4.0  3.5 - 5.1 mEq/L   Chloride 105  96 - 112 mEq/L   CO2 27  19 - 32 mEq/L   Glucose, Bld 106 (*) 70 - 99 mg/dL   BUN 13  6 - 23 mg/dL   Creatinine, Ser 8.11  0.50 - 1.10 mg/dL   Calcium 9.8  8.4 - 91.4 mg/dL   Total Protein 7.5  6.0 - 8.3 g/dL   Albumin 3.9  3.5 - 5.2 g/dL   AST 60 (*) 0 - 37 U/L   ALT 40 (*) 0 - 35 U/L   Alkaline Phosphatase 44  39 - 117 U/L   Total Bilirubin 0.3  0.3 - 1.2 mg/dL   GFR calc non Af Amer >90  >90 mL/min   GFR calc Af Amer >90  >90 mL/min   Comment: (NOTE)     The eGFR has been calculated using the CKD EPI equation.     This calculation has not been validated in all clinical situations.     eGFR's persistently <90 mL/min signify possible Chronic Kidney     Disease.     Performed at [facility]  [facility]    Physical Findings: AIMS: Facial and Oral  Movements Muscles of Facial Expression: None, normal Lips and Perioral Area: None, normal Jaw: None, normal Tongue: None, normal,Extremity Movements Upper (arms, wrists, hands, fingers): None, normal Lower (legs, knees, ankles, toes): None, normal, Trunk Movements Neck, shoulders, hips: None, normal, Overall Severity Severity of abnormal movements (highest score from questions above): None, normal Incapacitation due to abnormal movements: None, normal Patient's awareness of abnormal movements (rate only patient's report): No Awareness, Dental Status Current problems with teeth and/or dentures?: No Does patient usually wear dentures?: No  CIWA:  CIWA-Ar Total: 3 COWS:  COWS Total Score: 5  Treatment Plan Summary: Daily contact with patient to assess and evaluate symptoms and progress in treatment Medication management  Plan: 1. Continue crisis management and stabilization. 2. Medication management to reduce current symptoms to base line and improve patient's overall level of functioning 3. Treat health problems as indicated. 4. Develop treatment plan to decrease risk of relapse upon discharge and the need for readmission. 5. Psycho-social education regarding relapse prevention and self care. 6. Health care follow up as needed for medical problems. 7. Continue home medications where appropriate. 8. Will move this patient to [address].   Medical Decision Making Problem Points:  Established problem, stable/improving (1) Data Points:  Review or order clinical lab tests (1)  I certify that inpatient services furnished can reasonably be expected to improve the patient's condition.   Rona Ravens. [place] RPAC 6:11 PM 05/24/2013  Reviewed the information documented and agree with the treatment plan.  Mirka Barbone,JANARDHAHA R. 05/25/2013 4:27 PM

## 2013-05-24 NOTE — Tx Team (Signed)
Interdisciplinary Treatment Plan Update   Date Reviewed:  05/24/2013  Time Reviewed:  9:35 AM  Progress in Treatment:   Attending groups: Yes Participating in groups: Yes Taking medication as prescribed: Yes  Tolerating medication: Yes Family/Significant other contact made: No, but will ask patient for consent for collateral contact Patient understands diagnosis: Yes  Discussing patient identified problems/goals with staff: Yes Medical problems stabilized or resolved: Yes Denies suicidal/homicidal ideation: Yes Patient has not harmed self or others: Yes  For review of initial/current patient goals, please see plan of care.  Estimated Length of Stay:  3-4 days  Reasons for Continued Hospitalization:  Anxiety Depression Medication stabilization Detox Protocol   New Problems/Goals identified:    Discharge Plan or Barriers:   Home with outpatient follow up  Additional Comments:  Patient is a 32 year old white female admitted from [facility] for requesting opioid dependence and now heroin relapse and wants to be detox because she does not want keep on using it and she does not want to go horrible withdrawal symptoms while in [place], nausea, vomiting, chills, sweating and insomnia. Patient's last use was yesterday at 1:30 p.m. Patient reports that she used 0.6 grams of heroin. Patient reports that she relapsed for weeks ago, in October without specific triggers. Patient reports the first time that she used heroine was 4 1/2 years ago.    Attendees:  Patient:  05/24/2013 9:35 AM   Signature: Mervyn Gay, MD 05/24/2013 9:35 AM  Signature:  Verne Spurr, PA 05/24/2013 9:35 AM  Signature:  05/24/2013 9:35 AM  Signature:Beverly Terrilee Croak, RN 05/24/2013 9:35 AM  Signature:  Neill Loft RN 05/24/2013 9:35 AM  Signature:  Juline Patch, LCSW 05/24/2013 9:35 AM  Signature:  Reyes Ivan, LCSW 05/24/2013 9:35 AM  Signature:  Sharin Grave Coordinator 05/24/2013 9:35 AM  Signature:   Marzetta Board, RN 05/24/2013 9:35 AM  Signature:  05/24/2013  9:35 AM  Signature:    05/24/2013  9:35 AM  Signature:   05/24/2013  9:35 AM    Scribe for Treatment Team:   Juline Patch,  05/24/2013 9:35 AM
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
## 2013-05-24 NOTE — Progress Notes (Signed)
Recreation Therapy Notes  Date: 11.10.2014 Time: 3:00pm Location: [address] Dayroom  Group Topic: Communication, Team Building, Problem Solving  Goal Area(s) Addresses:  Patient will effectively work with peer towards shared goal.  Patient will identify skill used to make activity successful.  Patient will identify how skills used during activity can be used to reach post d/c goals.   Behavioral Response: Engaged, Attentive, Appropriate  Intervention: Problem Solving Activitiy  Activity: Life Boat. Patients were given a scenario about being on a sinking yacht. Patients were informed the yacht included 15 guest, 8 of which could be placed on the life boat, along with all group members. Individuals on guest list were of varying socioeconomic classes such as a Education officer, museum, Materials engineer, Midwife, Tree surgeon.   Education: Customer service manager, Discharge Planning   Education Outcome: Acknowledges understanding  Clinical Observations/Feedback: Patient actively engaged in group activity, voicing her opinion and debating with peers appropriately. Patient related skills identified by group members to being able to work effectively within her support system. Patient connected working within her support system to her whole wellness.    Marykay Lex Kerrington [place], LRT/CTRS  Giannina Bartolome L 05/24/2013 3:59 PM

## 2013-05-25 NOTE — Progress Notes (Signed)
Spoke with Tanna Savoy about moving patient to the 300 hall.  He stated patient would have to be moved tomorrow once a bed became available.

## 2013-05-25 NOTE — Progress Notes (Signed)
The focus of this group is to educate the patient on the purpose and policies of crisis stabilization and provide a format to answer questions about their admission.  The group details unit policies and expectations of patients while admitted.  Patient attended 0900 nurse education orientation group this morning.  Patient listened, appropriate affect, alert, appropriate insight and engagement.  Today patient will work on 3 goals for discharge.  

## 2013-05-25 NOTE — Progress Notes (Signed)
D: Pt has participated appropriately in all unit activities . Denies SI,HI, & AVH. Pt was somewhat anxious as her boss was coming for dinner & requested ativan. A: Ativan 1 mg. Given with calming effect. COWS = 3 . Continues on 15 minute checks. R: Pt safety maintained

## 2013-05-25 NOTE — Progress Notes (Signed)
Adult Psychoeducational Group Note  Date:  05/25/2013 Time:  11:00am Group Topic/Focus:  Recovery Goals:   The focus of this group is to identify appropriate goals for recovery and establish a plan to achieve them.  Participation Level:  Did Not Attend  Participation Quality:    Affect:    Cognitive:    Insight:   Engagement in Group:   Modes of Intervention:    Additional Comments:  Pt did not attend group.  Shelly Bombard D 05/25/2013, 1:17 PM

## 2013-05-25 NOTE — Progress Notes (Signed)
Patient ID: [name], female   DOB: 1981-06-17, 32 y.o.   MRN: [id number] [facility] MD Progress Note  05/25/2013 3:22 PM [name]  MRN:  [id number] Subjective: Met with [name] to discuss her progress. She is feeling much better today with the exception of hearing that her Hep C ab test is reactive. She is anxious about this as her current BF is Hep C+. She is given CDC patient information and her questions are answered. A confirmatory Hep CRNA is ordered for today. She is anxious to return home and work. Britaney has only a few chills and sweats, myalgias, joint, back pain. Her last COWS is 3. She would like to discharge tomorrow to return to work. She has plans to return to her meetings for support and is clear on how and why she relapsed. She is motivated to return to her support system and maintain her sobriety.  She also would rather remain on the 500 Hall at this time,and not move to 300.  Diagnosis:   DSM5: Schizophrenia Disorders:   Obsessive-Compulsive Disorders:   Trauma-Stressor Disorders:   Substance/Addictive Disorders:  Opiate dependence Depressive Disorders:    ADL's:  Intact  Sleep: Good  Appetite:  Good  Suicidal Ideation:  denies Homicidal Ideation:  denies AEB (as evidenced by):  Psychiatric Specialty Exam: Review of Systems  Constitutional: Positive for chills and diaphoresis. Negative for fever and weight loss.  HENT: Negative for congestion and sore throat.   Eyes: Negative for blurred vision, double vision and photophobia.  Respiratory: Negative for cough, shortness of breath and wheezing.   Cardiovascular: Negative for chest pain, palpitations and PND.  Gastrointestinal: Negative for vomiting and constipation.  Musculoskeletal: Positive for back pain, joint pain and myalgias. Negative for falls.  Neurological: Positive for weakness. Negative for dizziness, tingling, tremors, sensory change, speech change, focal weakness, seizures, loss of  consciousness and headaches.  Endo/Heme/Allergies: Negative for polydipsia. Does not bruise/bleed easily.  Psychiatric/Behavioral: Negative for depression, suicidal ideas, hallucinations, memory loss and substance abuse. The patient is not nervous/anxious and does not have insomnia.     Blood pressure 118/76, pulse 95, temperature 98.1 F (36.7 C), temperature source Oral, resp. rate 16, height 5\' 10"  (1.778 m), weight 79.833 kg (176 lb), last menstrual period 04/23/2013, SpO2 100.00%.Body mass index is 25.25 kg/(m^2).  General Appearance: Disheveled  Eye Contact::  Minimal  Speech:  Clear and Coherent  Volume:  Normal  Mood:  Dysphoric  Affect:  Congruent  Thought Process:  Goal Directed  Orientation:  Full (Time, Place, and Person)  Thought Content:  WDL  Suicidal Thoughts:  No  Homicidal Thoughts:  No  Memory:  NA  Judgement:  Intact  Insight:  Present  Psychomotor Activity:  Decreased  Concentration:  Fair  Recall:  Fair  Akathisia:  No  Handed:  Right  AIMS (if indicated):     Assets:  Desire for Improvement  Sleep:  Number of Hours: 6.5   Current Medications: Current Facility-Administered Medications  Medication Dose Route Frequency Provider Last Rate Last Dose  . acetaminophen (TYLENOL) tablet 650 mg  650 mg Oral Q6H PRN Earney Navy, NP      . alum & mag hydroxide-simeth (MAALOX/MYLANTA) 200-200-20 MG/5ML suspension 30 mL  30 mL Oral Q4H PRN Earney Navy, NP      . cloNIDine (CATAPRES) tablet 0.1 mg  0.1 mg Oral BH-qamhs Nehemiah Settle, MD   0.1 mg at 05/25/13 0758   Followed by  . [  START ON 05/27/2013] cloNIDine (CATAPRES) tablet 0.1 mg  0.1 mg Oral QAC breakfast Nehemiah Settle, MD      . dicyclomine (BENTYL) tablet 20 mg  20 mg Oral Q6H PRN Earney Navy, NP      . hydrOXYzine (ATARAX/VISTARIL) tablet 25 mg  25 mg Oral Q6H PRN Earney Navy, NP   25 mg at 05/25/13 1429  . ibuprofen (ADVIL,MOTRIN) tablet 600 mg  600 mg Oral  Q8H PRN Earney Navy, NP   600 mg at 05/25/13 1429  . loperamide (IMODIUM) capsule 2-4 mg  2-4 mg Oral PRN Earney Navy, NP      . LORazepam (ATIVAN) tablet 1 mg  1 mg Oral Q8H PRN Earney Navy, NP   1 mg at 05/25/13 0800  . magnesium hydroxide (MILK OF MAGNESIA) suspension 30 mL  30 mL Oral Daily PRN Earney Navy, NP      . methocarbamol (ROBAXIN) tablet 500 mg  500 mg Oral Q8H PRN Earney Navy, NP   500 mg at 05/25/13 1429  . ondansetron (ZOFRAN) tablet 4 mg  4 mg Oral Q8H PRN Earney Navy, NP      . ondansetron (ZOFRAN-ODT) disintegrating tablet 4 mg  4 mg Oral Q6H PRN Earney Navy, NP      . zolpidem (AMBIEN) tablet 5 mg  5 mg Oral QHS PRN Earney Navy, NP   5 mg at 05/24/13 2109    Lab Results:  Results for orders placed during the hospital encounter of 05/22/13 (from the past 48 hour(s))  HEPATITIS C ANTIBODY     Status: Abnormal   Collection Time    05/24/13  6:31 AM      Result Value Range   HCV Ab Reactive (*) NEGATIVE   Comment: (NOTE)                                                                               This test is for screening purposes only.  Reactive results should be     confirmed by an alternative method.  Suggest HCV Qualitative, PCR,     test code 16109.  Specimens will be stable for reflex testing up to 3     days after collection.     Performed at [facility]  COMPREHENSIVE METABOLIC PANEL     Status: Abnormal   Collection Time    05/24/13  6:31 AM      Result Value Range   Sodium 138  135 - 145 mEq/L   Potassium 4.0  3.5 - 5.1 mEq/L   Chloride 105  96 - 112 mEq/L   CO2 27  19 - 32 mEq/L   Glucose, Bld 106 (*) 70 - 99 mg/dL   BUN 13  6 - 23 mg/dL   Creatinine, Ser 6.04  0.50 - 1.10 mg/dL   Calcium 9.8  8.4 - 54.0 mg/dL   Total Protein 7.5  6.0 - 8.3 g/dL   Albumin 3.9  3.5 - 5.2 g/dL   AST 60 (*) 0 - 37 U/L   ALT 40 (*) 0 - 35 U/L   Alkaline Phosphatase 44  39 - 117 U/L  Total Bilirubin 0.3   0.3 - 1.2 mg/dL   GFR calc non Af Amer >90  >90 mL/min   GFR calc Af Amer >90  >90 mL/min   Comment: (NOTE)     The eGFR has been calculated using the CKD EPI equation.     This calculation has not been validated in all clinical situations.     eGFR's persistently <90 mL/min signify possible Chronic Kidney     Disease.     Performed at [facility]    Physical Findings: AIMS: Facial and Oral Movements Muscles of Facial Expression: None, normal Lips and Perioral Area: None, normal Jaw: None, normal Tongue: None, normal,Extremity Movements Upper (arms, wrists, hands, fingers): None, normal Lower (legs, knees, ankles, toes): None, normal, Trunk Movements Neck, shoulders, hips: None, normal, Overall Severity Severity of abnormal movements (highest score from questions above): None, normal Incapacitation due to abnormal movements: None, normal Patient's awareness of abnormal movements (rate only patient's report): No Awareness, Dental Status Current problems with teeth and/or dentures?: No Does patient usually wear dentures?: No  CIWA:  CIWA-Ar Total: 2 COWS:  COWS Total Score: 5  Treatment Plan Summary: Daily contact with patient to assess and evaluate symptoms and progress in treatment Medication management  Plan: 1. Continue crisis management and stabilization. 2. Medication management to reduce current symptoms to base line and improve patient's overall level of functioning 3. Treat health problems as indicated. 4. Develop treatment plan to decrease risk of relapse upon discharge and the need for readmission. 5. Psycho-social education regarding relapse prevention and self care. 6. Health care follow up as needed for medical problems. 7. Continue home medications where appropriate. 8. Will order Hep C RNA 9. Remove order to switch patient to 300 hall at her request. 10. Will anticipate d/c out tomorrow if continues to do well.   Medical Decision  Making Problem Points:  Established problem, stable/improving (1) Data Points:  Review or order clinical lab tests (1)  I certify that inpatient services furnished can reasonably be expected to improve the patient's condition.  Rona Ravens. [place] RPAC 3:22 PM 05/25/2013 Agree with assessment and plan Madie Reno A. Dub Mikes, M.D.

## 2013-05-25 NOTE — B[facility] Group Notes (Signed)
B[facility] LCSW Group Therapy      Feelings About Diagnosis 1:15 - 2:30 PM         05/25/2013  3:59 PM    Type of Therapy:  Group Therapy  Participation Level:  Active  Participation Quality:  Appropriate  Affect:  Appropriate  Cognitive:  Alert and Appropriate  Insight:  Developing/Improving and Engaged  Engagement in Therapy:  Developing/Improving and Engaged  Modes of Intervention:  Discussion, Education, Exploration, Problem-Solving, Rapport Building, Support  Summary of Progress/Problems:  Patient actively participated in group. Patient discussed past and present diagnosis and the effects it has had on  life.  Patient talked about family and society being judgmental and the stigma associated with having a mental health diagnosis. Patient shared she has a diagnosis of Substance Abuse and recognized the need to stay away from people, place, and things that will lead to her relapse.  Wynn Banker 05/25/2013  3:59 PM

## 2013-05-25 NOTE — Progress Notes (Addendum)
Recreation Therapy Notes  Date: 11.11.2014 Time: 2:45pm Location: 500 Hall Dayroom  Group Topic: Software engineer Activities (AAA)  Behavioral Response: Engaged, Attentive, Appropriate  Affect: Euthymic  Clinical Observations/Feedback: Dog Team: [employer]. Patient interacted appropriately with peer, dog team, LRT and MHT.   Marykay Lex Phiona Ramnauth, LRT/CTRS  Anner Baity L 05/25/2013 5:15 PM

## 2013-05-26 DIAGNOSIS — F0630 Mood disorder due to known physiological condition, unspecified: Secondary | ICD-10-CM

## 2013-05-26 LAB — HEPATITIS C VRS RNA DETECT BY PCR-QUAL: Hepatitis C Vrs RNA by PCR-Qual: POSITIVE — AB

## 2013-05-26 NOTE — BHH Suicide Risk Assessment (Signed)
Suicide Risk Assessment  Discharge Assessment     Demographic Factors:  Adolescent or young adult, Caucasian and Low socioeconomic status  Mental Status Per Nursing Assessment::   On Admission:     Current Mental Status by Physician: Mental Status Examination: Patient appeared as per his stated age, casually dressed, and fairly groomed, and maintaining good eye contact. Patient has good mood and his affect was constricted. He has normal rate, rhythm, and volume of speech. His thought process is linear and goal directed. Patient has denied suicidal, homicidal ideations, intentions or plans. Patient has no evidence of auditory or visual hallucinations, delusions, and paranoia. Patient has fair insight judgment and impulse control.  Loss Factors: Financial problems/change in socioeconomic status  Historical Factors: Prior suicide attempts, Family history of mental illness or substance abuse and Impulsivity  Risk Reduction Factors:   Responsible for children under 31 years of age, Sense of responsibility to family, Religious beliefs about death, Living with another person, especially a relative, Positive social support, Positive therapeutic relationship and Positive coping skills or problem solving skills  Continued Clinical Symptoms:  Depression:   Impulsivity Recent sense of peace/wellbeing Alcohol/Substance Abuse/Dependencies Previous Psychiatric Diagnoses and Treatments Medical Diagnoses and Treatments/Surgeries  Cognitive Features That Contribute To Risk:  Polarized thinking    Suicide Risk:  Minimal: No identifiable suicidal ideation.  Patients presenting with no risk factors but with morbid ruminations; may be classified as minimal risk based on the severity of the depressive symptoms  Discharge Diagnoses:   AXIS I:  Substance Induced Mood Disorder and Opiate dependence AXIS II:  Deferred AXIS III:   Past Medical History  Diagnosis Date  . Herniated disc   . Shoulder  dislocation, recurrent    AXIS IV:  other psychosocial or environmental problems, problems related to social environment and problems with primary support group AXIS V:  61-70 mild symptoms  Plan Of Care/Follow-up recommendations:  Activity:  As tolerated Diet:  Regular  Is patient on multiple antipsychotic therapies at discharge:  No   Has Patient had three or more failed trials of antipsychotic monotherapy by history:  No  Recommended Plan for Multiple Antipsychotic Therapies: NA  Nehemiah Settle., M.D. 05/26/2013, 1:08 PM

## 2013-05-26 NOTE — Progress Notes (Signed)
D: Patient resting in bed with eyes closed.  Respirations even and unlabored.  Patient appears to be in no apparent distress. A: Staff to monitor Q 15 mins for safety.   R:Patient remains safe on the unit.  

## 2013-05-26 NOTE — Progress Notes (Signed)
Adult Psychoeducational Group Note  Date:  05/25/13 Time:  8:00pm  Group Topic/Focus:  Wrap-Up Group:   The focus of this group is to help patients review their daily goal of treatment and discuss progress on daily workbooks.  Participation Level:  Active  Participation Quality:  Appropriate and Sharing  Affect:  Appropriate  Cognitive:  Appropriate  Insight: Appropriate  Engagement in Group:  Engaged  Modes of Intervention:  Discussion, Education, Socialization and Support  Additional Comments:  Pt stated that she is in the hospital for a heroin addiction. Pt stated that she has a willingness to never give up and that she is persistent.   Jesus Poplin 05/26/2013, 12:26 AM

## 2013-05-26 NOTE — Progress Notes (Signed)
Adult Psychoeducational Group Note  Date:  05/26/2013 Time:  10:00am Group Topic/Focus: Therapeutic Activity :      Participation Level:  Active  Participation Quality:  Appropriate and Attentive  Affect:  Appropriate  Cognitive:  Alert and Appropriate  Insight: Appropriate  Engagement in Group:  Engaged  Modes of Intervention:  Discussion and Education  Additional Comments: Pt did not attend group.   Shelly Bombard D 05/26/2013, 1:50 PM

## 2013-05-26 NOTE — Progress Notes (Signed)
Patient ID: [name], female   DOB: 1981-02-27, 32 y.o.   MRN: [id number]  Patient presents with depressed mood and affect. Patient denies SI/HI and A/V hallucinations. Patient denies pain. Patient rates her depression and hopelessness at a 1/10 today. Q15 minute safety checks were maintained until patient was discharged from [facility]. Writer gave patient support and encouragement to continue to work towards recovery. Writer and patient went over discharge instructions and how patient can call [facility] after discharge and speak to PA about her lab result for Hepatitis C. Patient was not given any medications on discharge. Patient verbalized understanding of discharge instructions and how to follow up on her lab result. Patient belongings were returned and patient signed belongings sheet. Patient was discharged with no signs of distress.

## 2013-05-26 NOTE — Discharge Summary (Signed)
Physician Discharge Summary Note  Patient:  [name] is an 32 y.o., female MRN:  [id number] DOB:  [birth year] Patient phone:  [phone] (home)  Patient address:   [address] [place] [address],   Date of Admission:  05/22/2013 Date of Discharge: 05/26/2013  Reason for Admission:  Heroin dependence   Discharge Diagnoses: Active Problems:   Heroin dependence   Mood disorder due to a general medical condition  ROS  DSM5: DSM5:  Schizophrenia Disorders:  Obsessive-Compulsive Disorders:  Trauma-Stressor Disorders:  Substance/Addictive Disorders: Opioid Disorder - Severe (304.00) and Opioid Intoxication (292.89)  Depressive Disorders:  AXIS I: Substance Induced Mood Disorder and Opioid dependence  AXIS II: Deferred  AXIS III:  Past Medical History   Diagnosis  Date   .  Herniated disc    .  Shoulder dislocation, recurrent     AXIS IV: other psychosocial or environmental problems, problems related to social environment and problems with primary support group  AXIS V: 41-50 serious symptoms  Level of Care:  OP  Hospital Course:  [name] is a 32 year old WF who presented to the [facility] requesting assistance with detoxing from heroin. She had relapsed approximately 3-4 weeks ago after several specific triggers. She states she got a promotion which demanded more time and stopped working her NA program. She is feeling guilty that she set herself up for relapse. She had been clean for almost a year. [name] states she was using 0.6 grams daily. She denied current withdrawal symptoms but did not want to have them with out help.        [name] was given medical clearance and admitted to the [address] and placed on a Clonidine protocol. She was evaluated each day and her cows scores consistently declined. During the course of her admission she was tested for Hep C and her initial screening was positive and a Hep CRNA was done the day before her discharge.  [name]'s primary symptoms of withdrawal were choriza, yawning, sneezing, muscle cramps, abdominal cramps, diaphoresis with nausea and vomiting. All were treated symptomatically with PRNs.       After her COWs score dropped below 5 [name] felt much better and was anxious to leave the hospital to return to work.       She denied any further symptoms of withdrawal and was discharged out with instructions to contact the unit for the final results of her Hep CRNA.        The day after her discharge the CRNA was returned positive. [name] was notified by phone as planned. She was encouraged to follow up with her PCP who would refer her to GI or ID as deemed appropriate. [name] had been provided with information via patient handouts from the [facility] regarding Hep C prior to discharge. Upon receiving the phone call she was understandably distressed but felt able to use this as impetus to maintain her sobriety. She felt well supported by her family and all of her questions were answered.       [name] was encouraged to call or return to [facility] if she felt unsafe or at risk for relapse. She agreed to do so.  Consults:  None  Significant Diagnostic Studies:  labs: CBC, CMP, UA, UDS, UPT, HCab, Hep CRNA  Discharge Vitals:   Blood pressure 111/76, pulse 105, temperature 98 F (36.7 C), temperature source Oral, resp. rate 20, height 5\' 10"  (1.778 m), weight 79.833 kg (176 lb), last menstrual period 04/23/2013,  SpO2 100.00%. Body mass index is 25.25 kg/(m^2). Lab Results:   Results for orders placed during the hospital encounter of 05/22/13 (from the past 72 hour(s))  HEPATITIS C ANTIBODY     Status: Abnormal   Collection Time    05/24/13  6:31 AM      Result Value Range   HCV Ab Reactive (*) NEGATIVE   Comment: (NOTE)                                                                               This test is for screening purposes only.  Reactive results should be     confirmed by an alternative method.  Suggest HCV  Qualitative, PCR,     test code 29562.  Specimens will be stable for reflex testing up to 3     days after collection.     Performed at [facility]  COMPREHENSIVE METABOLIC PANEL     Status: Abnormal   Collection Time    05/24/13  6:31 AM      Result Value Range   Sodium 138  135 - 145 mEq/L   Potassium 4.0  3.5 - 5.1 mEq/L   Chloride 105  96 - 112 mEq/L   CO2 27  19 - 32 mEq/L   Glucose, Bld 106 (*) 70 - 99 mg/dL   BUN 13  6 - 23 mg/dL   Creatinine, Ser 1.30  0.50 - 1.10 mg/dL   Calcium 9.8  8.4 - 86.5 mg/dL   Total Protein 7.5  6.0 - 8.3 g/dL   Albumin 3.9  3.5 - 5.2 g/dL   AST 60 (*) 0 - 37 U/L   ALT 40 (*) 0 - 35 U/L   Alkaline Phosphatase 44  39 - 117 U/L   Total Bilirubin 0.3  0.3 - 1.2 mg/dL   GFR calc non Af Amer >90  >90 mL/min   GFR calc Af Amer >90  >90 mL/min   Comment: (NOTE)     The eGFR has been calculated using the CKD EPI equation.     This calculation has not been validated in all clinical situations.     eGFR's persistently <90 mL/min signify possible Chronic Kidney     Disease.     Performed at [facility]    Physical Findings: AIMS: Facial and Oral Movements Muscles of Facial Expression: None, normal Lips and Perioral Area: None, normal Jaw: None, normal Tongue: None, normal,Extremity Movements Upper (arms, wrists, hands, fingers): None, normal Lower (legs, knees, ankles, toes): None, normal, Trunk Movements Neck, shoulders, hips: None, normal, Overall Severity Severity of abnormal movements (highest score from questions above): None, normal Incapacitation due to abnormal movements: None, normal Patient's awareness of abnormal movements (rate only patient's report): No Awareness, Dental Status Current problems with teeth and/or dentures?: No Does patient usually wear dentures?: No  CIWA:  CIWA-Ar Total: 2 COWS:  COWS Total Score: 3  Psychiatric Specialty Exam: See Psychiatric Specialty Exam and Suicide Risk Assessment  completed by Attending Physician prior to discharge.  Discharge destination:  Home  Is patient on multiple antipsychotic therapies at discharge:  No   Has Patient had three or more failed trials  of antipsychotic monotherapy by history:  No  Recommended Plan for Multiple Antipsychotic Therapies: NA  Discharge Orders   Future Orders Complete By Expires   Diet - low sodium heart healthy  As directed    Discharge instructions  As directed    Comments:     You have not been prescribed any medication on this hospital admission. You will need to follow up with your lab work with your out patient primary care provider. You may call the unit [phone] in the next 2-3 days. Ask for the Extender on your hall ([facility]) to get the results of your tests. If you have any problems or feel any symptoms that you are not comfortable with go to the nearest ED or call your PCP.   Increase activity slowly  As directed        Medication List    STOP taking these medications       GOODYS EXTRA STRENGTH 260-130-16 MG Tabs  Generic drug:  Aspirin-Acetaminophen-Caffeine     ibuprofen 200 MG tablet  Commonly known as:  ADVIL,MOTRIN           Follow-up Information   Follow up with [facility] On 05/28/2013. (Please go to [facility] walk in clinic on Friday, May 28, 2013 between 8AM - 12:00 PM for medication managment and counseling)    Contact information:   [address] [place], [place]   [address]   [phone]      Follow-up recommendations:   Activities: Resume activity as tolerated. Diet: Heart healthy low sodium diet Tests: Follow up testing will be determined by your out patient provider. Comments:    Total Discharge Time:  Greater than 30 minutes.  Signed: MASHBURN,NEIL 05/26/2013, 11:16 AM  Patient was seen personally for psychiatric evaluation, suicide risk assessment and case discussed with her physician extender and formulate a treatment and disposition  plan.Reviewed the information documented and agree with the treatment plan., M.D.  Darrol Jump R. 06/02/2013 4:30 PM

## 2013-05-26 NOTE — [facility] Suicide Risk Assessment (Signed)
[facility] INPATIENT:  Family/Significant Other Suicide Prevention Education  Suicide Prevention Education:  Patient Refusal for Family/Significant Other Suicide Prevention Education: The patient [name] has refused to provide written consent for family/significant other to be provided Family/Significant Other Suicide Prevention Education during admission and/or prior to discharge.  Physician notified.  Wynn Banker 05/26/2013, 9:57 AM

## 2013-05-26 NOTE — Tx Team (Signed)
Interdisciplinary Treatment Plan Update   Date Reviewed:  05/26/2013  Time Reviewed:  9:40 AM  Progress in Treatment:   Attending groups: Yes Participating in groups: Yes Taking medication as prescribed: Yes  Tolerating medication: Yes Family/Significant other contact made: No, patient declined collateral contact. Patient understands diagnosis: Yes  Discussing patient identified problems/goals with staff: Yes Medical problems stabilized or resolved: Yes Denies suicidal/homicidal ideation: Yes Patient has not harmed self or others: Yes  For review of initial/current patient goals, please see plan of care.  Estimated Length of Stay:  Discharge today  Reasons for Continued Hospitalization:   New Problems/Goals identified:    Discharge Plan or Barriers:   Home with outpatient follow up Family Services  Additional Comments:  N/A   Attendees:  Patient:  05/26/2013 9:40 AM   Signature: Mervyn Gay, MD 05/26/2013 9:40 AM  Signature:  Verne Spurr, PA 05/26/2013 9:40 AM  Signature:  05/26/2013 9:40 AM  Signature: Harold Barban, RN 05/26/2013 9:40 AM  Signature:  Leighton Parody, RN 05/26/2013 9:40 AM  Signature:  Juline Patch, LCSW 05/26/2013 9:40 AM  Signature:  Reyes Ivan, LCSW 05/26/2013 9:40 AM  Signature:  Sharin Grave Coordinator 05/26/2013 9:40 AM  Signature:  Marzetta Board, RN 05/26/2013 9:40 AM  Signature:  05/26/2013  9:40 AM  Signature:    05/26/2013  9:40 AM  Signature:   05/26/2013  9:40 AM    Scribe for Treatment Team:   Juline Patch,  05/26/2013 9:40 AM

## 2013-05-26 NOTE — Progress Notes (Signed)
[facility] Adult Case Management Discharge Plan :  Will you be returning to the same living situation after discharge: Yes,  Patient is returning to her home. At discharge, do you have transportation home?:Yes,  Patient to arrange transporation home. Do you have the ability to pay for your medications:Yes,  Patient has Medicaid.  Release of information consent forms completed and in the chart;  Patient's signature needed at discharge.  Patient to Follow up at: Follow-up Information   Follow up with [facility] On 05/28/2013. (Please go to [facility] in clinic on Friday, May 28, 2013 between 8AM - 12:00 PM for medication managment and counseling)    Contact information:   [address] [place], [place]   [address]   [phone]      Patient denies SI/HI:  Patient no longer endorsing SI/HI or other thoughts of self harm.  Safety Planning and Suicide Prevention discussed: .Reviewed with all patients during discharge planning group  Kavish Lafitte, [place] July 05/26/2013, 10:54 AM

## 2013-05-31 NOTE — Progress Notes (Signed)
Patient Discharge Instructions:  After Visit Summary (AVS):   Faxed to:  05/31/13 Psychiatric Admission Assessment Note:   Faxed to:  05/31/13 Suicide Risk Assessment - Discharge Assessment:   Faxed to:  05/31/13 Faxed/Sent to the Next Level Care provider:  05/31/13 Faxed to [facility] @ [phone]  Jerelene Redden, 05/31/2013, 3:45 PM

## 2013-07-22 ENCOUNTER — Encounter (HOSPITAL_COMMUNITY): Payer: Self-pay | Admitting: Emergency Medicine

## 2013-07-22 ENCOUNTER — Emergency Department (HOSPITAL_COMMUNITY)
Admission: EM | Admit: 2013-07-22 | Discharge: 2013-07-23 | Disposition: A | Discharge status: Home / Self Care | Payer: Medicaid Other | Attending: Emergency Medicine | Admitting: Emergency Medicine

## 2013-07-22 DIAGNOSIS — F329 Major depressive disorder, single episode, unspecified: Secondary | ICD-10-CM | POA: Insufficient documentation

## 2013-07-22 DIAGNOSIS — Z3202 Encounter for pregnancy test, result negative: Secondary | ICD-10-CM | POA: Insufficient documentation

## 2013-07-22 DIAGNOSIS — F1110 Opioid abuse, uncomplicated: Secondary | ICD-10-CM | POA: Insufficient documentation

## 2013-07-22 DIAGNOSIS — F17200 Nicotine dependence, unspecified, uncomplicated: Secondary | ICD-10-CM | POA: Insufficient documentation

## 2013-07-22 DIAGNOSIS — F3289 Other specified depressive episodes: Secondary | ICD-10-CM | POA: Insufficient documentation

## 2013-07-22 DIAGNOSIS — Z8619 Personal history of other infectious and parasitic diseases: Secondary | ICD-10-CM | POA: Insufficient documentation

## 2013-07-22 DIAGNOSIS — Z8739 Personal history of other diseases of the musculoskeletal system and connective tissue: Secondary | ICD-10-CM | POA: Insufficient documentation

## 2013-07-22 HISTORY — DX: Unspecified viral hepatitis C without hepatic coma: B19.20

## 2013-07-22 LAB — COMPREHENSIVE METABOLIC PANEL
ALBUMIN: 4.2 g/dL (ref 3.5–5.2)
ALT: 14 U/L (ref 0–35)
AST: 39 U/L — AB (ref 0–37)
Alkaline Phosphatase: 47 U/L (ref 39–117)
BILIRUBIN TOTAL: 0.3 mg/dL (ref 0.3–1.2)
BUN: 11 mg/dL (ref 6–23)
CHLORIDE: 99 meq/L (ref 96–112)
CO2: 29 mEq/L (ref 19–32)
CREATININE: 0.59 mg/dL (ref 0.50–1.10)
Calcium: 10.7 mg/dL — ABNORMAL HIGH (ref 8.4–10.5)
GFR calc Af Amer: >90 mL/min (ref >90)
Glucose, Bld: 116 mg/dL — ABNORMAL HIGH (ref 70–99)
Potassium: 4 mEq/L (ref 3.7–5.3)
Sodium: 137 mEq/L (ref 137–147)
Total Protein: 7.4 g/dL (ref 6.0–8.3)

## 2013-07-22 LAB — RAPID URINE DRUG SCREEN, HOSP PERFORMED
AMPHETAMINES: NOT DETECTED
BENZODIAZEPINES: NOT DETECTED
Barbiturates: NOT DETECTED
Cocaine: NOT DETECTED
Opiates: POSITIVE — AB
Tetrahydrocannabinol: NOT DETECTED

## 2013-07-22 LAB — CBC
HCT: 35.1 % — ABNORMAL LOW (ref 36.0–46.0)
Hemoglobin: 11.9 g/dL — ABNORMAL LOW (ref 12.0–15.0)
MCH: 28.8 pg (ref 26.0–34.0)
MCHC: 33.9 g/dL (ref 30.0–36.0)
MCV: 85 fL (ref 78.0–100.0)
PLATELETS: 231 10*3/uL (ref 150–400)
RBC: 4.13 MIL/uL (ref 3.87–5.11)
RDW: 13.6 % (ref 11.5–15.5)
WBC: 3.5 10*3/uL — AB (ref 4.0–10.5)

## 2013-07-22 LAB — ACETAMINOPHEN LEVEL

## 2013-07-22 LAB — SALICYLATE LEVEL: SALICYLATE LVL: 3.6 mg/dL (ref 2.8–20.0)

## 2013-07-22 LAB — POCT PREGNANCY, URINE: PREG TEST UR: NEGATIVE

## 2013-07-22 LAB — ETHANOL: Alcohol, Ethyl (B): <11 mg/dL (ref 0–11)

## 2013-07-22 MED ORDER — ONDANSETRON HCL 4 MG PO TABS: 4.0000 mg | ORAL_TABLET | Freq: Three times a day (TID) | ORAL | Status: DC | PRN

## 2013-07-22 MED ORDER — ALUM & MAG HYDROXIDE-SIMETH 200-200-20 MG/5ML PO SUSP: 30.0000 mL | ORAL | Status: DC | PRN

## 2013-07-22 MED ORDER — METHOCARBAMOL 500 MG PO TABS: 500.0000 mg | ORAL_TABLET | Freq: Three times a day (TID) | ORAL | Status: DC | PRN

## 2013-07-22 MED ORDER — ONDANSETRON 4 MG PO TBDP: 4.0000 mg | ORAL_TABLET | Freq: Four times a day (QID) | ORAL | Status: DC | PRN

## 2013-07-22 MED ORDER — NICOTINE 21 MG/24HR TD PT24: 21.0000 mg | MEDICATED_PATCH | Freq: Every day | TRANSDERMAL | Status: DC

## 2013-07-22 MED ORDER — ZOLPIDEM TARTRATE 5 MG PO TABS: 5.0000 mg | ORAL_TABLET | Freq: Every evening | ORAL | Status: DC | PRN

## 2013-07-22 MED ORDER — IBUPROFEN 200 MG PO TABS: 600.0000 mg | ORAL_TABLET | Freq: Three times a day (TID) | ORAL | Status: DC | PRN

## 2013-07-22 MED ORDER — NAPROXEN 500 MG PO TABS
500.0000 mg | ORAL_TABLET | Freq: Two times a day (BID) | ORAL | Status: DC | PRN
  Administered 2013-07-23: 500 mg via ORAL
  Filled 2013-07-22: qty 1

## 2013-07-22 MED ORDER — ACETAMINOPHEN 325 MG PO TABS: 650.0000 mg | ORAL_TABLET | ORAL | Status: DC | PRN

## 2013-07-22 MED ORDER — HYDROXYZINE HCL 25 MG PO TABS: 25.0000 mg | ORAL_TABLET | Freq: Four times a day (QID) | ORAL | Status: DC | PRN

## 2013-07-22 MED ORDER — LORAZEPAM 1 MG PO TABS: 1.0000 mg | ORAL_TABLET | Freq: Three times a day (TID) | ORAL | Status: DC | PRN

## 2013-07-22 MED ORDER — LOPERAMIDE HCL 2 MG PO CAPS: 2.0000 mg | ORAL_CAPSULE | ORAL | Status: DC | PRN

## 2013-07-22 MED ORDER — DICYCLOMINE HCL 20 MG PO TABS: 20.0000 mg | ORAL_TABLET | Freq: Four times a day (QID) | ORAL | Status: DC | PRN

## 2013-07-22 NOTE — ED Notes (Addendum)
Pt from home requesting detox from IV heroin. Pt states that she last used around 2pm today. Pt denies ETOH, other drugs. Pt states that she was dx with Hep C and [facility]. Pt is A&O and in NAD

## 2013-07-22 NOTE — ED Provider Notes (Signed)
CSN: [id number]     Arrival date & time 07/22/13  1736 History   First MD Initiated Contact with Patient 07/22/13 1921     Chief Complaint  Patient presents with  . Addiction Problem   (Consider location/radiation/quality/duration/timing/severity/associated sxs/prior Treatment) HPI  33 year old female with history of hep C history and substance abuse presents requesting for heroin detox.  Patient states she has been using heroine recreationally for the past 4 years. She was able to stay clean for nearly a year last year but relapsed after having family problems and relationship problems. She was found out recently that she has hepatitis C, therefore felt very depressed and furthermore increase her heroine use. Her last use was this morning. She is here requesting for detox because she feels that she is destroying her life. She has no active homicidal or suicidal ideation and denies any auditory or visual hallucination. She has no other specific complaint. She denies alcohol abuse and states she is an occasional smoker. No complaints of fever, chills, chest pain, shortness of breath, abdominal pain, nausea vomiting diarrhea, dysuria or rash.  Past Medical History  Diagnosis Date  . Herniated disc   . Shoulder dislocation, recurrent   . Hepatitis C    History reviewed. No pertinent past surgical history. Family History  Problem Relation Age of Onset  . COPD Mother   . Drug abuse Mother   . Alcohol abuse Mother   . Hypertension Mother   . Arthritis Father   . Diabetes Father   . Alcohol abuse Father   . Drug abuse Father   . Hypertension Father    History  Substance Use Topics  . Smoking status: Current Some Day Smoker  . Smokeless tobacco: Not on file  . Alcohol Use: Yes   OB History   Grav Para Term Preterm Abortions TAB SAB Ect Mult Living   7 2 2  0 5 4 1   2      Review of Systems  All other systems reviewed and are negative.    Allergies  Review of patient's allergies  indicates no known allergies.  Home Medications   Current Outpatient Rx  Name  Route  Sig  Dispense  Refill  . Aspirin-Acetaminophen-Caffeine (GOODY HEADACHE PO)   Oral   Take 1 packet by mouth every 4 (four) hours as needed (headache).          BP 119/67  Pulse 85  Temp(Src) 98.6 F (37 C) (Oral)  Resp 20  SpO2 100%  LMP 04/23/2013 Physical Exam  Nursing note and vitals reviewed. Constitutional: She appears well-developed and well-nourished. No distress.  HENT:  Head: Atraumatic.  Eyes: Conjunctivae are normal.  Neck: Neck supple.  Cardiovascular: Normal rate and regular rhythm.   Pulmonary/Chest: Effort normal and breath sounds normal.  Abdominal: Soft. There is no tenderness.  Neurological: She is alert.  Skin: No rash noted.  Psychiatric: She has a normal mood and affect. Her speech is normal and behavior is normal. Judgment normal. Thought content is not paranoid. Cognition and memory are normal. She expresses no homicidal and no suicidal ideation.    ED Course  Procedures (including critical care time)  7:38 PM Patient here requesting for heroin detox. Will perform medical clearance will consult TTS for further management. Will placed patient on clonidine protocol for withdrawal sxs.    10:28 PM Pt is medically cleared.  TTS has been consulted and will manage pt further.    Labs Review Labs Reviewed  CBC -  Abnormal; Notable for the following:    WBC 3.5 (*)    Hemoglobin 11.9 (*)    HCT 35.1 (*)    All other components within normal limits  COMPREHENSIVE METABOLIC PANEL - Abnormal; Notable for the following:    Glucose, Bld 116 (*)    Calcium 10.7 (*)    AST 39 (*)    All other components within normal limits  URINE RAPID DRUG SCREEN (HOSP PERFORMED) - Abnormal; Notable for the following:    Opiates POSITIVE (*)    All other components within normal limits  ACETAMINOPHEN LEVEL  ETHANOL  SALICYLATE LEVEL  POCT PREGNANCY, URINE   Imaging Review No  results found.  EKG Interpretation   None       MDM   1. Heroin abuse    BP 119/67  Pulse 85  Temp(Src) 98.6 F (37 C) (Oral)  Resp 20  SpO2 100%  LMP 04/23/2013  I have reviewed nursing notes and vital signs.  I reviewed available ER/hospitalization records thought the EMR     Fayrene Helper, [place] 07/22/13 2231
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
## 2013-07-22 NOTE — ED Provider Notes (Signed)
Medical screening examination/treatment/procedure(s) were performed by non-physician practitioner and as supervising physician I was immediately available for consultation/collaboration.  EKG Interpretation   None         Elisabet Gutzmer, MD 07/22/13 2245 

## 2013-07-22 NOTE — BH Assessment (Signed)
[facility] Assessment Progress Note  At 22:27 I spoke to EDP Bowie in anticipation of TTS assessment scheduled for 22:35.  Karen Canninghomas Karman [place], MA Triage Specialist 07/22/2013 @ 22:28

## 2013-07-23 DIAGNOSIS — F411 Generalized anxiety disorder: Secondary | ICD-10-CM

## 2013-07-23 MED ORDER — HYDROXYZINE HCL 25 MG PO TABS: 25.0000 mg | ORAL_TABLET | Freq: Four times a day (QID) | ORAL | Status: DC | PRN

## 2013-07-23 MED ORDER — ONDANSETRON HCL 4 MG PO TABS: 4.0000 mg | ORAL_TABLET | Freq: Three times a day (TID) | ORAL | Status: DC | PRN

## 2013-07-23 MED ORDER — CLONIDINE HCL 0.1 MG PO TABS: 0.1000 mg | ORAL_TABLET | Freq: Three times a day (TID) | ORAL | Status: DC

## 2013-07-23 NOTE — Progress Notes (Signed)
   CARE MANAGEMENT ED NOTE 07/23/2013  Patient:  [name],[name]   Account Number:  [id number]  Date Initiated:  07/23/2013  Documentation initiated by:  Edd ArbourGIBBS,Yosiah Jasmin  Subjective/Objective Assessment:   33 yr old [place] pt states [place] ([place]  positive opiates in UDS     Subjective/Objective Assessment Detail:   [place] [place] [facility] [place]     Action/Plan:   Cm provided pt with a list of medicaid pcps in [place] and [place]   Action/Plan Detail:   Anticipated DC Date:       Status Recommendation to Physician:   Result of Recommendation:    Other ED Services  Consult Working Plan    DC [employer]  Other  PCP issues  Outpatient Services - Pt will follow up    Choice offered to / List presented to:            Status of service:  Completed, signed off  ED Comments:   ED Comments Detail:

## 2013-07-23 NOTE — ED Notes (Signed)
Pt reports she is able to sleep comfortably.

## 2013-07-23 NOTE — ED Notes (Signed)
Pt ambulated independently to nearby bathroom to void.

## 2013-07-23 NOTE — ED Notes (Signed)
Pt resting on triage room awaiting room no distress noted

## 2013-07-23 NOTE — Progress Notes (Signed)
Per discussion with psychiatrist and NP, patient psychiatrically stable for discharge home. Pt plans to follow up with [facility]S. CSW provided pt with outpatient resources.   Catha Gosselin.Deano Tomaszewski Stewart, LCSW [phone]  ED CSW .07/23/2013 1150am

## 2013-07-23 NOTE — ED Notes (Signed)
EDP Kohut made aware of pt's COWS scole of 8 and symptoms associated.

## 2013-07-23 NOTE — [facility] Suicide Risk Assessment (Cosign Needed)
Suicide Risk Assessment  Discharge Assessment     Demographic Factors:  Caucasian, Low socioeconomic status and Unemployed  Mental Status Per Nursing Assessment::   On Admission:   unknown  Current Mental Status by Physician: NA  Loss Factors: NA  Historical Factors: NA  Risk Reduction Factors:   NA  Continued Clinical Symptoms:  Severe Anxiety and/or Agitation  Cognitive Features That Contribute To Risk:  Closed-mindedness Loss of executive function Polarized thinking Thought constriction (tunnel vision)    Suicide Risk:  Minimal: No identifiable suicidal ideation.  Patients presenting with no risk factors but with morbid ruminations; may be classified as minimal risk based on the severity of the depressive symptoms  Discharge Diagnoses:   AXIS I:  Anxiety Disorder NOS and Rule out Substance dependence AXIS II:  Deferred AXIS III:   Past Medical History  Diagnosis Date  . Herniated disc   . Shoulder dislocation, recurrent   . Hepatitis C    AXIS IV:  economic problems, educational problems, housing problems, occupational problems, other psychosocial or environmental problems, problems related to legal system/crime, problems related to social environment, problems with access to health care services and problems with primary support group AXIS V:  61-70 mild symptoms  Plan Of Care/Follow-up recommendations:  Activity:   as tolerated  Diet:  regular Tests:  na  Other:  na  Is patient on multiple antipsychotic therapies at discharge:  No   Has Patient had three or more failed trials of antipsychotic monotherapy by history:  No  Recommended Plan for Multiple Antipsychotic Therapies: NA  Nobel Brar 07/23/2013, 2:18 PM
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
## 2013-07-23 NOTE — ED Notes (Signed)
Pt sleeping. 

## 2014-03-29 ENCOUNTER — Emergency Department (HOSPITAL_COMMUNITY): Payer: Medicaid Other

## 2014-03-29 ENCOUNTER — Encounter (HOSPITAL_COMMUNITY): Payer: Self-pay | Admitting: Emergency Medicine

## 2014-03-29 ENCOUNTER — Emergency Department (HOSPITAL_COMMUNITY)
Admission: EM | Admit: 2014-03-29 | Discharge: 2014-03-29 | Disposition: A | Discharge status: Home / Self Care | Payer: Medicaid Other | Attending: Emergency Medicine | Admitting: Emergency Medicine

## 2014-03-29 DIAGNOSIS — Y92009 Unspecified place in unspecified non-institutional (private) residence as the place of occurrence of the external cause: Secondary | ICD-10-CM | POA: Diagnosis not present

## 2014-03-29 DIAGNOSIS — Z8619 Personal history of other infectious and parasitic diseases: Secondary | ICD-10-CM | POA: Diagnosis not present

## 2014-03-29 DIAGNOSIS — Y9389 Activity, other specified: Secondary | ICD-10-CM | POA: Insufficient documentation

## 2014-03-29 DIAGNOSIS — Z7982 Long term (current) use of aspirin: Secondary | ICD-10-CM | POA: Diagnosis not present

## 2014-03-29 DIAGNOSIS — IMO0002 Reserved for concepts with insufficient information to code with codable children: Secondary | ICD-10-CM | POA: Diagnosis present

## 2014-03-29 DIAGNOSIS — Z79899 Other long term (current) drug therapy: Secondary | ICD-10-CM | POA: Insufficient documentation

## 2014-03-29 DIAGNOSIS — Z8739 Personal history of other diseases of the musculoskeletal system and connective tissue: Secondary | ICD-10-CM | POA: Insufficient documentation

## 2014-03-29 DIAGNOSIS — W1809XA Striking against other object with subsequent fall, initial encounter: Secondary | ICD-10-CM | POA: Diagnosis not present

## 2014-03-29 DIAGNOSIS — Z87891 Personal history of nicotine dependence: Secondary | ICD-10-CM | POA: Diagnosis not present

## 2014-03-29 DIAGNOSIS — M533 Sacrococcygeal disorders, not elsewhere classified: Secondary | ICD-10-CM

## 2014-03-29 MED ORDER — NAPROXEN 500 MG PO TABS: 500.0000 mg | ORAL_TABLET | Freq: Two times a day (BID) | ORAL | Status: DC

## 2014-03-29 MED ORDER — TRAMADOL HCL 50 MG PO TABS: 50.0000 mg | ORAL_TABLET | Freq: Four times a day (QID) | ORAL | Status: DC | PRN

## 2014-03-29 NOTE — ED Provider Notes (Signed)
CSN: [id number]     Arrival date & time 03/29/14  1032 History  This chart was scribed for non-physician practitioner Santiago Glad working with Audree Camel, MD by Littie Deeds, ED Scribe. This patient was seen in room TR05C/TR05C and the patient's care was started at 11:15 AM.     Chief Complaint  Patient presents with  . Tailbone Pain      The history is provided by the patient. No language interpreter was used.   HPI Comments: [name] is a 33 y.o. female who presents to the Emergency Department complaining of a constant tailbone pain after a fall on the concrete floor of her bathroom on her buttocks a few weeks ago. She has taken Ibuprofen without relief to pain. She denies numbness, tingling, ecchymosis, edema and bowel/bladder incontinence.   Past Medical History  Diagnosis Date  . Herniated disc   . Shoulder dislocation, recurrent   . Hepatitis C    History reviewed. No pertinent past surgical history. Family History  Problem Relation Age of Onset  . COPD Mother   . Drug abuse Mother   . Alcohol abuse Mother   . Hypertension Mother   . Arthritis Father   . Diabetes Father   . Alcohol abuse Father   . Drug abuse Father   . Hypertension Father    History  Substance Use Topics  . Smoking status: Former Games developer  . Smokeless tobacco: Not on file  . Alcohol Use: No   OB History   Grav Para Term Preterm Abortions TAB SAB Ect Mult Living   0 Review of Systems  Musculoskeletal: Positive for arthralgias. Negative for joint swelling.  Neurological: Negative for numbness.  All other systems reviewed and are negative.     Allergies  Review of patient's allergies indicates no known allergies.  Home Medications   Prior to Admission medications   Medication Sig Start Date End Date Taking? Authorizing Provider  Aspirin-Acetaminophen-Caffeine (GOODY HEADACHE PO) Take 1 packet by mouth every 4 (four) hours as needed (headache).     Historical Provider, MD  cloNIDine (CATAPRES) 0.1 MG tablet Take 1 tablet (0.1 mg total) by mouth 3 (three) times daily. 07/23/13   Kendrick Fries, NP  hydrOXYzine (ATARAX/VISTARIL) 25 MG tablet Take 1 tablet (25 mg total) by mouth every 6 (six) hours as needed for anxiety. 07/23/13   Meghan Blankmann, NP  ondansetron (ZOFRAN) 4 MG tablet Take 1 tablet (4 mg total) by mouth every 8 (eight) hours as needed for nausea or vomiting. 07/23/13   Kendrick Fries, NP   BP 112/63  Pulse 90  Temp(Src) 98.3 F (36.8 C) (Oral)  Resp 18  SpO2 100%  LMP 02/26/2014 Physical Exam  Nursing note and vitals reviewed. Constitutional: She is oriented to person, place, and time. She appears well-developed and well-nourished. No distress.  HENT:  Head: Normocephalic and atraumatic.  Mouth/Throat: Oropharynx is clear and moist. No oropharyngeal exudate.  Eyes: Pupils are equal, round, and reactive to light.  Neck: Neck supple.  Cardiovascular: Normal rate, regular rhythm and normal heart sounds.   No murmur heard. Pulses:      Dorsalis pedis pulses are 2+ on the right side, and 2+ on the left side.  Pulmonary/Chest: Effort normal and breath sounds normal. No respiratory distress. She has no wheezes. She has no rales.  Musculoskeletal: She exhibits no edema.  No TTP of thoracic, cervical and lumbar spine,  TTP in coccyx, no deformities or step offs  Neurological: She is alert and oriented to person, place, and time. No cranial nerve deficit.  Distal sensation of lower extremities intact  Skin: Skin is warm and dry. No rash noted.  Psychiatric: She has a normal mood and affect. Her behavior is normal.    ED Course  Procedures  DIAGNOSTIC STUDIES: Oxygen Saturation is 100% on room air, normal by my interpretation.    COORDINATION OF CARE: 11:17am ordered an XR of the patient's lower back. Patient has agreed to treatment plan.   Labs Review Labs Reviewed - No data to display  Imaging Review No results  found.   EKG Interpretation None      MDM   Final diagnoses:  None   Patient presenting with tailbone pain that has been present since a fall.  Xray today negative.  Patient neurovascularly intact.  RICE instructions given to the patient.  Patient stable for discharge.  Return precautions given.   Santiago Glad, PA-C 03/29/14 1204

## 2014-03-29 NOTE — ED Notes (Signed)
Patient states fell a couple of weeks ago and landed on cement flooring on her tailbone.  Patient states increased pain and discomfort.

## 2014-04-02 NOTE — ED Provider Notes (Signed)
Medical screening examination/treatment/procedure(s) were performed by non-physician practitioner and as supervising physician I was immediately available for consultation/collaboration.  Audree Camel, MD 04/02/14 [phone]

## 2014-05-16 ENCOUNTER — Encounter (HOSPITAL_COMMUNITY): Payer: Self-pay | Admitting: Emergency Medicine

## 2014-07-07 ENCOUNTER — Emergency Department (HOSPITAL_COMMUNITY)
Admission: EM | Admit: 2014-07-07 | Discharge: 2014-07-07 | Disposition: A | Discharge status: Home / Self Care | Payer: Medicaid Other | Attending: Emergency Medicine | Admitting: Emergency Medicine

## 2014-07-07 ENCOUNTER — Encounter (HOSPITAL_COMMUNITY): Payer: Self-pay | Admitting: Emergency Medicine

## 2014-07-07 DIAGNOSIS — Z8619 Personal history of other infectious and parasitic diseases: Secondary | ICD-10-CM | POA: Diagnosis not present

## 2014-07-07 DIAGNOSIS — Z79899 Other long term (current) drug therapy: Secondary | ICD-10-CM | POA: Diagnosis not present

## 2014-07-07 DIAGNOSIS — Z87891 Personal history of nicotine dependence: Secondary | ICD-10-CM | POA: Diagnosis not present

## 2014-07-07 DIAGNOSIS — M25461 Effusion, right knee: Secondary | ICD-10-CM | POA: Diagnosis not present

## 2014-07-07 DIAGNOSIS — Z791 Long term (current) use of non-steroidal anti-inflammatories (NSAID): Secondary | ICD-10-CM | POA: Insufficient documentation

## 2014-07-07 DIAGNOSIS — M25561 Pain in right knee: Secondary | ICD-10-CM | POA: Diagnosis present

## 2014-07-07 MED ORDER — MELOXICAM 15 MG PO TABS: 15.0000 mg | ORAL_TABLET | Freq: Every day | ORAL | Status: DC

## 2014-07-07 NOTE — ED Provider Notes (Signed)
CSN: [id number]     Arrival date & time 07/07/14  1050 History   First MD Initiated Contact with Patient 07/07/14 1059     No chief complaint on file.    (Consider location/radiation/quality/duration/timing/severity/associated sxs/prior Treatment) HPI Comments: Patient with HX of    Herniated disc                                               Shoulder dislocation, recurrent                              Hepatitis C     Heroin abuse and dependence Presents with c/o knee pain and swelling. She c/o pain, swelling, stiffness, worsened with movement and weight bearing. Rates it at 10. States she is having difficulty sleeping. She has tried nothing for the pain denies injury or hx of same. No heat or redness, no fevers. Patient is ambulatory.                                                Patient is a 33 y.o. female presenting with knee pain.  Knee Pain Location:  Knee Time since incident:  3 days Injury: no   Knee location:  R knee Pain details:    Quality:  Aching and pressure   Radiates to:  Does not radiate   Severity:  Severe   Onset quality:  Gradual   Duration:  3 days   Timing:  Constant   Progression:  Improving Chronicity:  New Dislocation: no   Relieved by:  None tried Worsened by:  Bearing weight, extension and flexion Ineffective treatments:  None tried Associated symptoms: decreased ROM, stiffness and swelling   Associated symptoms: no back pain, no fatigue, no fever, no itching, no muscle weakness, no neck pain, no numbness and no tingling      Past Medical History  Diagnosis Date  . Herniated disc   . Shoulder dislocation, recurrent   . Hepatitis C    No past surgical history on file. Family History  Problem Relation Age of Onset  . COPD Mother   . Drug abuse Mother   . Alcohol abuse Mother   . Hypertension Mother   . Arthritis Father   . Diabetes Father   . Alcohol abuse Father   . Drug abuse Father   . Hypertension Father    History  Substance  Use Topics  . Smoking status: Former Games developermoker  . Smokeless tobacco: Not on file  . Alcohol Use: No   OB History    Gravida Para Term Preterm AB TAB SAB Ectopic Multiple Living   7 2 2  0 5 4 1   2      Review of Systems  Constitutional: Negative for fever, chills and fatigue.  Respiratory: Negative for cough.   Cardiovascular: Negative for chest pain.  Musculoskeletal: Positive for joint swelling, gait problem and stiffness. Negative for back pain and neck pain.  Skin: Negative for itching, rash and wound.      Allergies  Review of patient's allergies indicates no known allergies.  Home Medications   Prior to Admission medications   Medication Sig Start Date End Date Taking?  Authorizing Provider  Aspirin-Acetaminophen-Caffeine (GOODY HEADACHE PO) Take 1 packet by mouth every 4 (four) hours as needed (headache).    Historical Provider, MD  cloNIDine (CATAPRES) 0.1 MG tablet Take 1 tablet (0.1 mg total) by mouth 3 (three) times daily. 07/23/13   Kendrick FriesMeghan Blankmann, NP  hydrOXYzine (ATARAX/VISTARIL) 25 MG tablet Take 1 tablet (25 mg total) by mouth every 6 (six) hours as needed for anxiety. 07/23/13   Kendrick FriesMeghan Blankmann, NP  naproxen (NAPROSYN) 500 MG tablet Take 1 tablet (500 mg total) by mouth 2 (two) times daily. 03/29/14   Heather Laisure, PA-C  ondansetron (ZOFRAN) 4 MG tablet Take 1 tablet (4 mg total) by mouth every 8 (eight) hours as needed for nausea or vomiting. 07/23/13   Kendrick FriesMeghan Blankmann, NP  traMADol (ULTRAM) 50 MG tablet Take 1 tablet (50 mg total) by mouth every 6 (six) hours as needed. 03/29/14   Heather Laisure, PA-C   BP 114/64 mmHg  Pulse 80  Temp(Src) 98.1 F (36.7 C) (Oral)  Resp 16  SpO2 96% Physical Exam  Constitutional: She is oriented to person, place, and time. She appears well-developed and well-nourished. No distress.  HENT:  Head: Normocephalic and atraumatic.  Eyes: Conjunctivae are normal. No scleral icterus.  Neck: Normal range of motion.  Cardiovascular:  Normal rate, regular rhythm, normal heart sounds and intact distal pulses.  Exam reveals no gallop and no friction rub.   No murmur heard. Pulmonary/Chest: Effort normal and breath sounds normal. No respiratory distress.  Abdominal: Soft. Bowel sounds are normal. She exhibits no distension and no mass. There is no tenderness. There is no guarding.  Musculoskeletal:  A right knee exam was performed. SKIN: intact SWELLING: minimal EFFUSION: not significant WARMTH: no warmth TENDERNESS: none and diffuse ROM: 80 degrees flexion 170 of extension STRENGTH: normal ALIGNMENT: neutral STABILITY: stable to testing GAIT: antalgic CREPITUS: no NEUROLOGICAL EXAM: normal VASCULAR EXAM: normal   Neurological: She is alert and oriented to person, place, and time.  Skin: Skin is warm and dry. She is not diaphoretic.  Nursing note and vitals reviewed.   ED Course  Procedures (including critical care time) Labs Review Labs Reviewed - No data to display  Imaging Review No results found.   EKG Interpretation None      MDM   Final diagnoses:  None    11:28 AM BP 114/64 mmHg  Pulse 80  Temp(Src) 98.1 F (36.7 C) (Oral)  Resp 16  SpO2 96% Patient with apparent effusion. demandin Narcotics. The patient became extremely angry when I told her that I did not feel it was appropriate given the mild swelling in her knee to prescribe narcotics, especially without injury, and given the fact that she had not tried anything. I stated that I would be happy to write her out of work and that if she was having problems sleeping she could take medication to help her sleep.    Patient will be discharged with knee sleeve, mobic, RICE protocol. Givne option to stay out of work or note for 15 min ice and rest every 2 hours. F/u with ortho.   Arthor Captainbigail Elis Rawlinson, PA-C 07/08/14 0720  Richardean Canalavid H Yao, MD 07/08/14 [phone]

## 2014-07-07 NOTE — ED Notes (Signed)
Pt expressed being unhappy with PA stating that she needs narcotic pain meds. Pt was advised that her knee is swollen and she needs anti inflammatory meds instead, ice and compression. Pt still upset but verbalized understanding. Ortho tech at bedside

## 2014-07-07 NOTE — Discharge Instructions (Signed)

## 2014-07-07 NOTE — ED Notes (Signed)
Pt c/o R knee pain with swelling x3 weeks that is aggravated by walking and standing on it for long periods of time while working. Pt denies injury. Pt is A&O and in NAD

## 2016-06-14 ENCOUNTER — Emergency Department (HOSPITAL_COMMUNITY): Payer: Self-pay

## 2016-06-14 ENCOUNTER — Emergency Department (HOSPITAL_COMMUNITY)
Admission: EM | Admit: 2016-06-14 | Discharge: 2016-06-14 | Disposition: A | Discharge status: Home / Self Care | Payer: Self-pay | Attending: Emergency Medicine | Admitting: Emergency Medicine

## 2016-06-14 DIAGNOSIS — S43035A Inferior dislocation of left humerus, initial encounter: Secondary | ICD-10-CM | POA: Insufficient documentation

## 2016-06-14 DIAGNOSIS — Z87891 Personal history of nicotine dependence: Secondary | ICD-10-CM | POA: Insufficient documentation

## 2016-06-14 DIAGNOSIS — X58XXXA Exposure to other specified factors, initial encounter: Secondary | ICD-10-CM | POA: Insufficient documentation

## 2016-06-14 DIAGNOSIS — S43015A Anterior dislocation of left humerus, initial encounter: Secondary | ICD-10-CM | POA: Insufficient documentation

## 2016-06-14 DIAGNOSIS — S43005A Unspecified dislocation of left shoulder joint, initial encounter: Secondary | ICD-10-CM

## 2016-06-14 DIAGNOSIS — Y939 Activity, unspecified: Secondary | ICD-10-CM | POA: Insufficient documentation

## 2016-06-14 DIAGNOSIS — Z79899 Other long term (current) drug therapy: Secondary | ICD-10-CM | POA: Insufficient documentation

## 2016-06-14 DIAGNOSIS — Z7982 Long term (current) use of aspirin: Secondary | ICD-10-CM | POA: Insufficient documentation

## 2016-06-14 DIAGNOSIS — Y999 Unspecified external cause status: Secondary | ICD-10-CM | POA: Insufficient documentation

## 2016-06-14 DIAGNOSIS — Y929 Unspecified place or not applicable: Secondary | ICD-10-CM | POA: Insufficient documentation

## 2016-06-14 MED ORDER — FENTANYL CITRATE (PF) 100 MCG/2ML IJ SOLN
50.0000 ug | Freq: Once | INTRAMUSCULAR | Status: AC
  Administered 2016-06-14: 50 ug via INTRAVENOUS
  Filled 2016-06-14: qty 2

## 2016-06-14 MED ORDER — PROPOFOL 10 MG/ML IV BOLUS
INTRAVENOUS | Status: AC | PRN
  Administered 2016-06-14: 60 ug via INTRAVENOUS

## 2016-06-14 MED ORDER — PROPOFOL 10 MG/ML IV BOLUS
INTRAVENOUS | Status: AC | PRN
  Administered 2016-06-14: 60 mg via INTRAVENOUS

## 2016-06-14 MED ORDER — ONDANSETRON HCL 4 MG/2ML IJ SOLN
4.0000 mg | Freq: Once | INTRAMUSCULAR | Status: AC
  Administered 2016-06-14: 4 mg via INTRAVENOUS
  Filled 2016-06-14: qty 2

## 2016-06-14 MED ORDER — NAPROXEN 500 MG PO TABS: 500.0000 mg | ORAL_TABLET | Freq: Two times a day (BID) | ORAL | 0 refills | Status: DC

## 2016-06-14 MED ORDER — PROPOFOL 10 MG/ML IV BOLUS
0.5000 mg/kg | Freq: Once | INTRAVENOUS | Status: AC
  Administered 2016-06-14: 40 mg via INTRAVENOUS
  Filled 2016-06-14: qty 20

## 2016-06-14 MED ORDER — PROPOFOL 10 MG/ML IV BOLUS
INTRAVENOUS | Status: AC
  Administered 2016-06-14: 40 mg
  Filled 2016-06-14: qty 40

## 2016-06-14 MED ORDER — OXYCODONE-ACETAMINOPHEN 5-325 MG PO TABS: 1.0000 | ORAL_TABLET | Freq: Four times a day (QID) | ORAL | 0 refills | Status: DC | PRN

## 2016-06-14 MED ORDER — HYDROCODONE-ACETAMINOPHEN 5-325 MG PO TABS: 1.0000 | ORAL_TABLET | Freq: Four times a day (QID) | ORAL | 0 refills | Status: DC | PRN

## 2016-06-14 NOTE — ED Provider Notes (Signed)
[facility]-EMERGENCY DEPT Provider Note   CSN: [id number] Arrival date & time: 06/14/16  0241     History   Chief Complaint Chief Complaint  Patient presents with  . Shoulder Pain    HPI [name] is a 35 y.o. female.  HPI   [name] is a 35 y.o. female, with a history of Recurrent left shoulder dislocation, presenting to the ED with Complaint of a left shoulder dislocation. Patient states that the shoulder has dislocated at least 20 times since she was 35 years old. States this evening she was asleep, woke up in pain, and saw that her shoulder was dislocated. She adds that she is supposed to have had surgery to correct this, but has not been able to afford it. Denies neuro deficits or other complaints.     Past Medical History:  Diagnosis Date  . Hepatitis C   . Herniated disc   . Shoulder dislocation, recurrent     Patient Active Problem List   Diagnosis Date Noted  . Complete miscarriage 11/02/2012  . 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 Heroin dependence (HCC) 05/22/2011    No past surgical history on file.  OB History    Gravida Para Term Preterm AB Living   7 2 2  0 5 2   SAB TAB Ectopic Multiple Live Births   1 4             Home Medications    Prior to Admission medications   Medication Sig Start Date End Date Taking? Authorizing Provider  Aspirin-Acetaminophen-Caffeine (GOODY HEADACHE PO) Take 1 packet by mouth every 4 (four) hours as needed (headache).    Historical Provider, MD  cloNIDine (CATAPRES) 0.1 MG tablet Take 1 tablet (0.1 mg total) by mouth 3 (three) times daily. 07/23/13   Kendrick FriesMeghan Blankmann, NP  HYDROcodone-acetaminophen (NORCO/VICODIN) 5-325 MG tablet Take 1 tablet by mouth every 6 (six) hours as needed. 06/14/16   Shawn C Joy, PA-C  hydrOXYzine (ATARAX/VISTARIL) 25 MG tablet Take 1 tablet (25 mg total) by mouth every 6 (six) hours as needed for anxiety. 07/23/13    Kendrick FriesMeghan Blankmann, NP  meloxicam (MOBIC) 15 MG tablet Take 1 tablet (15 mg total) by mouth daily. Take 1 daily with food. 07/07/14   Arthor CaptainAbigail Harris, PA-C  naproxen (NAPROSYN) 500 MG tablet Take 1 tablet (500 mg total) by mouth 2 (two) times daily. 03/29/14   Heather Laisure, PA-C  naproxen (NAPROSYN) 500 MG tablet Take 1 tablet (500 mg total) by mouth 2 (two) times daily. 06/14/16   Shawn C Joy, PA-C  ondansetron (ZOFRAN) 4 MG tablet Take 1 tablet (4 mg total) by mouth every 8 (eight) hours as needed for nausea or vomiting. 07/23/13   Kendrick FriesMeghan Blankmann, NP  traMADol (ULTRAM) 50 MG tablet Take 1 tablet (50 mg total) by mouth every 6 (six) hours as needed. 03/29/14   Santiago GladHeather Laisure, PA-C    Family History Family History  Problem Relation Age of Onset  . COPD Mother   . Drug abuse Mother   . Alcohol abuse Mother   . Hypertension Mother   . Arthritis Father   . Diabetes Father   . Alcohol abuse Father   . Drug abuse Father   . Hypertension Father     Social History Social History  Substance Use Topics  . Smoking status: Former Games developermoker  . Smokeless tobacco: Not on file  . Alcohol use No  Allergies   Patient has no allergy information on record.   Review of Systems Review of Systems  Respiratory: Negative for shortness of breath.   Cardiovascular: Negative for chest pain.  Musculoskeletal: Positive for arthralgias.  Neurological: Negative for weakness and numbness.  All other systems reviewed and are negative.    Physical Exam Updated Vital Signs BP 141/98 (BP Location: Right Arm)   Pulse (!) 121   Temp 98.6 F (37 C) (Oral)   Resp 24   Ht 5\' 10"  (1.778 m)   Wt 76.2 kg   LMP 05/29/2016   SpO2 97%   BMI 24.11 kg/m   Physical Exam  Constitutional: She appears well-developed and well-nourished. No distress.  HENT:  Head: Normocephalic and atraumatic.  Eyes: Conjunctivae are normal.  Neck: Normal range of motion. Neck supple.  Cardiovascular: Normal rate, regular  rhythm and intact distal pulses.   Pulmonary/Chest: Effort normal. No respiratory distress.  Abdominal: Soft. There is no tenderness. There is no guarding.  Musculoskeletal: She exhibits tenderness. She exhibits no edema.  Left humeral head noted to protrude out from the left shoulder socket. Associated tenderness. Range of motion intact in the left elbow and wrist.  Neurological: She is alert.  No sensory deficits in the bilateral upper extremities. Grip strength 5 out of 5 bilaterally.  Skin: Skin is warm and dry. Capillary refill takes less than 2 seconds. She is not diaphoretic.  Psychiatric: She has a normal mood and affect. Her behavior is normal.  Nursing note and vitals reviewed.    ED Treatments / Results  Labs (all labs ordered are listed, but only abnormal results are displayed) Labs Reviewed - No data to display  EKG  EKG Interpretation None       Radiology Dg Shoulder Left  Result Date: 06/14/2016 CLINICAL DATA:  Pain and deformities EXAM: LEFT SHOULDER - 2+ VIEW COMPARISON:  02/16/2012 FINDINGS: Anterior inferior dislocation of the left humeral head. No gross fracture seen. Left lung apex clear. IMPRESSION: Anterior, inferior dislocation of the left humeral head. Electronically Signed   By: Jasmine Pang M.D.   On: 06/14/2016 03:19   Dg Shoulder Left Portable  Result Date: 06/14/2016 CLINICAL DATA:  Reduction of shoulder dislocation EXAM: LEFT SHOULDER - 1 VIEW COMPARISON:  Shoulder radiograph 06/14/2016 FINDINGS: There is improved alignment of the left glenohumeral joint following reduction. No fracture identified. Visualized left chest is normal. IMPRESSION: Improved alignment of the left glenohumeral joint following reduction. Electronically Signed   By: Deatra Robinson M.D.   On: 06/14/2016 05:02    Procedures Reduction of dislocation Date/Time: 06/14/2016 3:30 AM Performed by: Anselm Pancoast Authorized by: Harolyn Rutherford C  Consent: Verbal consent obtained. Written  consent obtained. Risks and benefits: risks, benefits and alternatives were discussed Consent given by: patient Patient understanding: patient states understanding of the procedure being performed Patient consent: the patient's understanding of the procedure matches consent given Procedure consent: procedure consent matches procedure scheduled Patient identity confirmed: verbally with patient and arm band Time out: Immediately prior to procedure a "time out" was called to verify the correct patient, procedure, equipment, support staff and site/side marked as required. Local anesthesia used: no  Anesthesia: Local anesthesia used: no  Sedation: Patient sedated: yes Sedation type: moderate (conscious) sedation Sedatives: propofol Vitals: Vital signs were monitored during sedation. Patient tolerance: Patient tolerated the procedure well with no immediate complications    (including critical care time)  Medications Ordered in ED Medications  fentaNYL (SUBLIMAZE) injection 50  mcg (50 mcg Intravenous Given 06/14/16 0304)  ondansetron (ZOFRAN) injection 4 mg (4 mg Intravenous Given 06/14/16 0259)  propofol (DIPRIVAN) 10 mg/mL bolus/IV push 38.1 mg (40 mg Intravenous Given 06/14/16 0355)  propofol (DIPRIVAN) 10 mg/mL bolus/IV push (40 mg  Given 06/14/16 0359)  propofol (DIPRIVAN) 10 mg/mL bolus/IV push (60 mcg Intravenous Given 06/14/16 0402)  propofol (DIPRIVAN) 10 mg/mL bolus/IV push (60 mg Intravenous Given 06/14/16 0405)     Initial Impression / Assessment and Plan / ED Course  I have reviewed the triage vital signs and the nursing notes.  Pertinent labs & imaging results that were available during my care of the patient were reviewed by me and considered in my medical decision making (see chart for details).  Clinical Course     Patient presents with left shoulder pain. Dislocation noted on x-ray. Reduced at the bedside. Improvement on repeat x-ray. Patient has full range of motion in  the left shoulder following reduction. Patient placed in a sling. Orthopedic follow-up.   Findings and plan of care discussed with Geoffery Lyonsouglas Delo, MD. Dr. Judd Lienelo personally evaluated and examined this patient.  Vitals:   06/14/16 0425 06/14/16 0430 06/14/16 0435 06/14/16 0445  BP: 110/85 118/72 100/63 121/85  Pulse: 78 76 71   Resp: 21 19 15 18   Temp:      TempSrc:      SpO2:      Weight:      Height:         Final Clinical Impressions(s) / ED Diagnoses   Final diagnoses:  Dislocation of left shoulder joint, initial encounter    New Prescriptions New Prescriptions   HYDROCODONE-ACETAMINOPHEN (NORCO/VICODIN) 5-325 MG TABLET    Take 1 tablet by mouth every 6 (six) hours as needed.   NAPROXEN (NAPROSYN) 500 MG TABLET    Take 1 tablet (500 mg total) by mouth 2 (two) times daily.     Anselm PancoastShawn C Joy, PA-C 06/14/16 [id number]    Anselm PancoastShawn C Joy, PA-C 06/14/16 0522    Geoffery Lyonsouglas Delo, MD 06/17/16 [phone]

## 2016-06-14 NOTE — Discharge Instructions (Signed)
Your shoulder dislocation was reduced here in the ED. Follow up with orthopedics as soon as possible for chronic management of this issue.

## 2016-06-14 NOTE — ED Triage Notes (Signed)
Pt reports Hx of left shoulder dislocation and was awakened by shoulder pain tonight

## 2016-08-30 ENCOUNTER — Encounter (HOSPITAL_COMMUNITY): Payer: Self-pay | Admitting: *Deleted

## 2016-08-30 ENCOUNTER — Inpatient Hospital Stay (HOSPITAL_COMMUNITY)
Admission: AD | Admit: 2016-08-30 | Discharge: 2016-08-30 | Disposition: A | Discharge status: Home / Self Care | Payer: Medicaid Other | Source: Ambulatory Visit | Attending: Family Medicine | Admitting: Family Medicine

## 2016-08-30 DIAGNOSIS — Z87891 Personal history of nicotine dependence: Secondary | ICD-10-CM | POA: Diagnosis not present

## 2016-08-30 DIAGNOSIS — B1920 Unspecified viral hepatitis C without hepatic coma: Secondary | ICD-10-CM | POA: Insufficient documentation

## 2016-08-30 DIAGNOSIS — N912 Amenorrhea, unspecified: Secondary | ICD-10-CM

## 2016-08-30 DIAGNOSIS — F1120 Opioid dependence, uncomplicated: Secondary | ICD-10-CM | POA: Diagnosis not present

## 2016-08-30 NOTE — Discharge Instructions (Signed)

## 2016-08-30 NOTE — MAU Note (Signed)
Pt had pos HPT 3 days ago, is on methadone.  Denies pain or bleeding.  Wants to get checked out.

## 2016-08-30 NOTE — MAU Provider Note (Signed)
History     CSN: [id number]  Arrival date and time: 08/30/16 1839   None     Chief Complaint  Patient presents with  . Possible Pregnancy   HPI [name] is a 36 y.o. [phone] who presents to MAU today with complaint of possible pregnancy and concern about current Methadone usage. The patient states that she had multiple +HPTs and then +UPT at the Methadone clinic this week. LMP 07/28/16. She denies abdominal pain, vaginal bleeding, N/V or fever. She states that she was told she is high risk due to Methadone and wanted to make sure she received appropriate prenatal care.   OB History    Gravida Para Term Preterm AB Living   7 2 2  0 5 2   SAB TAB Ectopic Multiple Live Births   1 4            Past Medical History:  Diagnosis Date  . Hepatitis C   . Herniated disc   . Shoulder dislocation, recurrent     No past surgical history on file.  Family History  Problem Relation Age of Onset  . COPD Mother   . Drug abuse Mother   . Alcohol abuse Mother   . Hypertension Mother   . Arthritis Father   . Diabetes Father   . Alcohol abuse Father   . Drug abuse Father   . Hypertension Father     Social History  Substance Use Topics  . Smoking status: Former Games developermoker  . Smokeless tobacco: Not on file  . Alcohol use No    Allergies: Not on File  Prescriptions Prior to Admission  Medication Sig Dispense Refill Last Dose  . Aspirin-Acetaminophen-Caffeine (GOODY HEADACHE PO) Take 1 packet by mouth every 4 (four) hours as needed (headache).   07/22/2013 at 11  . cloNIDine (CATAPRES) 0.1 MG tablet Take 1 tablet (0.1 mg total) by mouth 3 (three) times daily. 6 tablet 0   . hydrOXYzine (ATARAX/VISTARIL) 25 MG tablet Take 1 tablet (25 mg total) by mouth every 6 (six) hours as needed for anxiety. 30 tablet 0   . meloxicam (MOBIC) 15 MG tablet Take 1 tablet (15 mg total) by mouth daily. Take 1 daily with food. 10 tablet 0   . naproxen (NAPROSYN) 500 MG tablet Take 1 tablet (500  mg total) by mouth 2 (two) times daily. 30 tablet 0   . naproxen (NAPROSYN) 500 MG tablet Take 1 tablet (500 mg total) by mouth 2 (two) times daily. 30 tablet 0   . ondansetron (ZOFRAN) 4 MG tablet Take 1 tablet (4 mg total) by mouth every 8 (eight) hours as needed for nausea or vomiting. 20 tablet 0   . oxyCODONE-acetaminophen (PERCOCET/ROXICET) 5-325 MG tablet Take 1 tablet by mouth every 6 (six) hours as needed for severe pain. 10 tablet 0   . traMADol (ULTRAM) 50 MG tablet Take 1 tablet (50 mg total) by mouth every 6 (six) hours as needed. 15 tablet 0     Review of Systems  Constitutional: Negative for fever.  Gastrointestinal: Negative for abdominal pain, nausea and vomiting.  Genitourinary: Negative for vaginal bleeding.   Physical Exam   Blood pressure 134/69, pulse 72, temperature 99.2 F (37.3 C), temperature source Oral, resp. rate 18, height 5\' 10"  (1.778 m), weight 178 lb (80.7 kg), last menstrual period 07/28/2016.  Physical Exam  Nursing note and vitals reviewed. Constitutional: She is oriented to person, place, and time. She appears well-developed and well-nourished. No distress.  HENT:  Head: Normocephalic and atraumatic.  Cardiovascular: Normal rate.   Respiratory: Effort normal.  GI: Soft. She exhibits no distension.  Neurological: She is alert and oriented to person, place, and time.  Skin: Skin is warm and dry. No erythema.  Psychiatric: She has a normal mood and affect.   MAU Course  Procedures None  MDM No concerning symptoms today No need for UPT today, referred to [facility] for UPT confirmation on Monday.   Assessment and Plan  A: Amenorrhea Methadone dependence  P:  Discharge home First trimester/ecoptic precautions discussed Patient advised to follow-up with [facility] for pregnancy confirmation on Monday Patient may return to MAU as needed or if her condition were to change or worsen   Marny Lowenstein, PA-C  08/30/2016, 7:05 PM

## 2016-09-03 ENCOUNTER — Ambulatory Visit (INDEPENDENT_AMBULATORY_CARE_PROVIDER_SITE_OTHER): Payer: Medicaid Other | Admitting: *Deleted

## 2016-09-03 ENCOUNTER — Ambulatory Visit: Payer: Self-pay

## 2016-09-03 ENCOUNTER — Encounter: Payer: Self-pay | Admitting: General Practice

## 2016-09-03 DIAGNOSIS — Z3201 Encounter for pregnancy test, result positive: Secondary | ICD-10-CM

## 2016-09-03 DIAGNOSIS — Z3200 Encounter for pregnancy test, result unknown: Secondary | ICD-10-CM

## 2016-09-03 LAB — POCT PREGNANCY, URINE: Preg Test, Ur: POSITIVE — AB

## 2016-09-03 NOTE — Progress Notes (Addendum)
+  UPT today.  Pt states she has irregular periods. LMP- unknown - possibly in December. Last unprotected sex was 08/07/16.  Medication reconciliation completed. Bedside US for viability and approximate EGA performed - unable to visualize gestational sac, yolk sac or fetal pole within uterus.  Pt denies any abdominal or pelvic pain. Pt was advised to return to hospital if she has heavy vaginal bleeding or abdominal/pelvic pain. She will schedule New Ob appt and we will re-evaluate viability at that time.  Pt voiced understanding.    Pt informed that the ultrasound is considered a limited OB ultrasound and is not intended to be a complete ultrasound exam.  Patient also informed that the ultrasound is not being completed with the intent of assessing for fetal or placental anomalies or any pelvic abnormalities.  Explained that the purpose of today's ultrasound is to assess for viability.  Patient acknowledges the purpose of the exam and the limitations of the study.

## 2016-09-06 ENCOUNTER — Inpatient Hospital Stay (HOSPITAL_COMMUNITY)
Admission: AD | Admit: 2016-09-06 | Discharge: 2016-09-06 | Disposition: A | Discharge status: Home / Self Care | Payer: Medicaid Other | Source: Ambulatory Visit | Attending: Obstetrics and Gynecology | Admitting: Obstetrics and Gynecology

## 2016-09-06 ENCOUNTER — Encounter (HOSPITAL_COMMUNITY): Payer: Self-pay | Admitting: *Deleted

## 2016-09-06 ENCOUNTER — Inpatient Hospital Stay (HOSPITAL_COMMUNITY): Payer: Medicaid Other

## 2016-09-06 DIAGNOSIS — O3481 Maternal care for other abnormalities of pelvic organs, first trimester: Secondary | ICD-10-CM | POA: Insufficient documentation

## 2016-09-06 DIAGNOSIS — O26891 Other specified pregnancy related conditions, first trimester: Secondary | ICD-10-CM

## 2016-09-06 DIAGNOSIS — R109 Unspecified abdominal pain: Secondary | ICD-10-CM | POA: Insufficient documentation

## 2016-09-06 DIAGNOSIS — O98411 Viral hepatitis complicating pregnancy, first trimester: Secondary | ICD-10-CM | POA: Insufficient documentation

## 2016-09-06 DIAGNOSIS — Z87891 Personal history of nicotine dependence: Secondary | ICD-10-CM | POA: Diagnosis not present

## 2016-09-06 DIAGNOSIS — B1920 Unspecified viral hepatitis C without hepatic coma: Secondary | ICD-10-CM | POA: Insufficient documentation

## 2016-09-06 DIAGNOSIS — R102 Pelvic and perineal pain: Secondary | ICD-10-CM

## 2016-09-06 DIAGNOSIS — Z3A01 Less than 8 weeks gestation of pregnancy: Secondary | ICD-10-CM | POA: Diagnosis not present

## 2016-09-06 LAB — CBC
HEMATOCRIT: 29.9 % — AB (ref 36.0–46.0)
HEMOGLOBIN: 10.5 g/dL — AB (ref 12.0–15.0)
MCH: 29.5 pg (ref 26.0–34.0)
MCHC: 35.1 g/dL (ref 30.0–36.0)
MCV: 84 fL (ref 78.0–100.0)
Platelets: 252 10*3/uL (ref 150–400)
RBC: 3.56 MIL/uL — ABNORMAL LOW (ref 3.87–5.11)
RDW: 13.7 % (ref 11.5–15.5)
WBC: 5.6 10*3/uL (ref 4.0–10.5)

## 2016-09-06 LAB — WET PREP, GENITAL
CLUE CELLS WET PREP: NONE SEEN
SPERM: NONE SEEN
TRICH WET PREP: NONE SEEN
Yeast Wet Prep HPF POC: NONE SEEN

## 2016-09-06 LAB — HCG, QUANTITATIVE, PREGNANCY: HCG, BETA CHAIN, QUANT, S: 22040 m[IU]/mL — AB (ref <5)

## 2016-09-06 NOTE — MAU Note (Signed)
Stressed.  Having issues with partner.  Has started cramping.  Hx of SAB.  having major anxiety issues.  Pt has been clean for a year, is in a methadone program.

## 2016-09-06 NOTE — Discharge Instructions (Signed)
First Trimester of Pregnancy  The first trimester of pregnancy is from week 1 until the end of week 12 (months 1 through 3). A week after a sperm fertilizes an egg, the egg will implant on the wall of the uterus. This embryo will begin to develop into a baby. Genes from you and your partner are forming the baby. The female genes determine whether the baby is a boy or a girl. At 6-8 weeks, the eyes and face are formed, and the heartbeat can be seen on ultrasound. At the end of 12 weeks, all the baby's organs are formed.   Now that you are pregnant, you will want to do everything you can to have a healthy baby. Two of the most important things are to get good prenatal care and to follow your health care provider's instructions. Prenatal care is all the medical care you receive before the baby's birth. This care will help prevent, find, and treat any problems during the pregnancy and childbirth.  BODY CHANGES  Your body goes through many changes during pregnancy. The changes vary from woman to woman.   · You may gain or lose a couple of pounds at first.  · You may feel sick to your stomach (nauseous) and throw up (vomit). If the vomiting is uncontrollable, call your health care provider.  · You may tire easily.  · You may develop headaches that can be relieved by medicines approved by your health care provider.  · You may urinate more often. Painful urination may mean you have a bladder infection.  · You may develop heartburn as a result of your pregnancy.  · You may develop constipation because certain hormones are causing the muscles that push waste through your intestines to slow down.  · You may develop hemorrhoids or swollen, bulging veins (varicose veins).  · Your breasts may begin to grow larger and become tender. Your nipples may stick out more, and the tissue that surrounds them (areola) may become darker.  · Your gums may bleed and may be sensitive to brushing and flossing.   · Dark spots or blotches (chloasma, mask of pregnancy) may develop on your face. This will likely fade after the baby is born.  · Your menstrual periods will stop.  · You may have a loss of appetite.  · You may develop cravings for certain kinds of food.  · You may have changes in your emotions from day to day, such as being excited to be pregnant or being concerned that something may go wrong with the pregnancy and baby.  · You may have more vivid and strange dreams.  · You may have changes in your hair. These can include thickening of your hair, rapid growth, and changes in texture. Some women also have hair loss during or after pregnancy, or hair that feels dry or thin. Your hair will most likely return to normal after your baby is born.  WHAT TO EXPECT AT YOUR PRENATAL VISITS  During a routine prenatal visit:  · You will be weighed to make sure you and the baby are growing normally.  · Your blood pressure will be taken.  · Your abdomen will be measured to track your baby's growth.  · The fetal heartbeat will be listened to starting around week 10 or 12 of your pregnancy.  · Test results from any previous visits will be discussed.  Your health care provider may ask you:  · How you are feeling.  · If you   are feeling the baby move.  · If you have had any abnormal symptoms, such as leaking fluid, bleeding, severe headaches, or abdominal cramping.  · If you are using any tobacco products, including cigarettes, chewing tobacco, and electronic cigarettes.  · If you have any questions.  Other tests that may be performed during your first trimester include:  · Blood tests to find your blood type and to check for the presence of any previous infections. They will also be used to check for low iron levels (anemia) and Rh antibodies. Later in the pregnancy, blood tests for diabetes will be done along with other tests if problems develop.  · Urine tests to check for infections, diabetes, or protein in the urine.   · An ultrasound to confirm the proper growth and development of the baby.  · An amniocentesis to check for possible genetic problems.  · Fetal screens for spina bifida and Down syndrome.  · You may need other tests to make sure you and the baby are doing well.  · HIV (human immunodeficiency virus) testing. Routine prenatal testing includes screening for HIV, unless you choose not to have this test.  HOME CARE INSTRUCTIONS   Medicines  · Follow your health care provider's instructions regarding medicine use. Specific medicines may be either safe or unsafe to take during pregnancy.  · Take your prenatal vitamins as directed.  · If you develop constipation, try taking a stool softener if your health care provider approves.  Diet  · Eat regular, well-balanced meals. Choose a variety of foods, such as meat or vegetable-based protein, fish, milk and low-fat dairy products, vegetables, fruits, and whole grain breads and cereals. Your health care provider will help you determine the amount of weight gain that is right for you.  · Avoid raw meat and uncooked cheese. These carry germs that can cause birth defects in the baby.  · Eating four or five small meals rather than three large meals a day may help relieve nausea and vomiting. If you start to feel nauseous, eating a few soda crackers can be helpful. Drinking liquids between meals instead of during meals also seems to help nausea and vomiting.  · If you develop constipation, eat more high-fiber foods, such as fresh vegetables or fruit and whole grains. Drink enough fluids to keep your urine clear or pale yellow.  Activity and Exercise  · Exercise only as directed by your health care provider. Exercising will help you:    Control your weight.    Stay in shape.    Be prepared for labor and delivery.  · Experiencing pain or cramping in the lower abdomen or low back is a good sign that you should stop exercising. Check with your health care provider  before continuing normal exercises.  · Try to avoid standing for long periods of time. Move your legs often if you must stand in one place for a long time.  · Avoid heavy lifting.  · Wear low-heeled shoes, and practice good posture.  · You may continue to have sex unless your health care provider directs you otherwise.  Relief of Pain or Discomfort  · Wear a good support bra for breast tenderness.    · Take warm sitz baths to soothe any pain or discomfort caused by hemorrhoids. Use hemorrhoid cream if your health care provider approves.    · Rest with your legs elevated if you have leg cramps or low back pain.  · If you develop varicose veins in your   legs, wear support hose. Elevate your feet for 15 minutes, 3-4 times a day. Limit salt in your diet.  Prenatal Care  · Schedule your prenatal visits by the twelfth week of pregnancy. They are usually scheduled monthly at first, then more often in the last 2 months before delivery.  · Write down your questions. Take them to your prenatal visits.  · Keep all your prenatal visits as directed by your health care provider.  Safety  · Wear your seat belt at all times when driving.  · Make a list of emergency phone numbers, including numbers for family, friends, the hospital, and police and fire departments.  General Tips  · Ask your health care provider for a referral to a local prenatal education class. Begin classes no later than at the beginning of month 6 of your pregnancy.  · Ask for help if you have counseling or nutritional needs during pregnancy. Your health care provider can offer advice or refer you to specialists for help with various needs.  · Do not use hot tubs, steam rooms, or saunas.  · Do not douche or use tampons or scented sanitary pads.  · Do not cross your legs for long periods of time.  · Avoid cat litter boxes and soil used by cats. These carry germs that can cause birth defects in the baby and possibly loss of the fetus by miscarriage or stillbirth.   · Avoid all smoking, herbs, alcohol, and medicines not prescribed by your health care provider. Chemicals in these affect the formation and growth of the baby.  · Do not use any tobacco products, including cigarettes, chewing tobacco, and electronic cigarettes. If you need help quitting, ask your health care provider. You may receive counseling support and other resources to help you quit.  · Schedule a dentist appointment. At home, brush your teeth with a soft toothbrush and be gentle when you floss.  SEEK MEDICAL CARE IF:   · You have dizziness.  · You have mild pelvic cramps, pelvic pressure, or nagging pain in the abdominal area.  · You have persistent nausea, vomiting, or diarrhea.  · You have a bad smelling vaginal discharge.  · You have pain with urination.  · You notice increased swelling in your face, hands, legs, or ankles.  SEEK IMMEDIATE MEDICAL CARE IF:   · You have a fever.  · You are leaking fluid from your vagina.  · You have spotting or bleeding from your vagina.  · You have severe abdominal cramping or pain.  · You have rapid weight gain or loss.  · You vomit blood or material that looks like coffee grounds.  · You are exposed to German measles and have never had them.  · You are exposed to fifth disease or chickenpox.  · You develop a severe headache.  · You have shortness of breath.  · You have any kind of trauma, such as from a fall or a car accident.     This information is not intended to replace advice given to you by your health care provider. Make sure you discuss any questions you have with your health care provider.     Document Released: 06/25/2001 Document Revised: 07/22/2014 Document Reviewed: 05/11/2013  [employer] Interactive Patient Education ©2017 [employer]
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
## 2016-09-06 NOTE — MAU Note (Signed)
Urine in lab 

## 2016-09-07 LAB — HIV ANTIBODY (ROUTINE TESTING W REFLEX): HIV SCREEN 4TH GENERATION: NONREACTIVE

## 2016-09-09 LAB — GC/CHLAMYDIA PROBE AMP (~~LOC~~) NOT AT ARMC
Chlamydia: NEGATIVE
NEISSERIA GONORRHEA: NEGATIVE

## 2016-09-25 ENCOUNTER — Encounter (HOSPITAL_COMMUNITY): Payer: Self-pay

## 2016-09-25 ENCOUNTER — Inpatient Hospital Stay (HOSPITAL_COMMUNITY)
Admission: AD | Admit: 2016-09-25 | Discharge: 2016-09-25 | Disposition: A | Discharge status: Home / Self Care | Payer: Medicaid Other | Source: Ambulatory Visit | Attending: Obstetrics & Gynecology | Admitting: Obstetrics & Gynecology

## 2016-09-25 DIAGNOSIS — R109 Unspecified abdominal pain: Secondary | ICD-10-CM | POA: Diagnosis not present

## 2016-09-25 DIAGNOSIS — B182 Chronic viral hepatitis C: Secondary | ICD-10-CM | POA: Diagnosis present

## 2016-09-25 DIAGNOSIS — Z3A09 9 weeks gestation of pregnancy: Secondary | ICD-10-CM | POA: Diagnosis not present

## 2016-09-25 DIAGNOSIS — Z3682 Encounter for antenatal screening for nuchal translucency: Secondary | ICD-10-CM

## 2016-09-25 DIAGNOSIS — O9989 Other specified diseases and conditions complicating pregnancy, childbirth and the puerperium: Secondary | ICD-10-CM | POA: Diagnosis not present

## 2016-09-25 DIAGNOSIS — Z87891 Personal history of nicotine dependence: Secondary | ICD-10-CM | POA: Diagnosis not present

## 2016-09-25 DIAGNOSIS — O2341 Unspecified infection of urinary tract in pregnancy, first trimester: Secondary | ICD-10-CM | POA: Diagnosis not present

## 2016-09-25 DIAGNOSIS — R300 Dysuria: Secondary | ICD-10-CM | POA: Diagnosis present

## 2016-09-25 DIAGNOSIS — O99320 Drug use complicating pregnancy, unspecified trimester: Secondary | ICD-10-CM

## 2016-09-25 DIAGNOSIS — F1120 Opioid dependence, uncomplicated: Secondary | ICD-10-CM | POA: Insufficient documentation

## 2016-09-25 DIAGNOSIS — O0990 Supervision of high risk pregnancy, unspecified, unspecified trimester: Secondary | ICD-10-CM

## 2016-09-25 HISTORY — DX: Postconcussional syndrome: F07.81

## 2016-09-25 HISTORY — DX: Mood disorder due to known physiological condition, unspecified: F06.30

## 2016-09-25 LAB — URINALYSIS, ROUTINE W REFLEX MICROSCOPIC
Bilirubin Urine: NEGATIVE
GLUCOSE, UA: NEGATIVE mg/dL
Hgb urine dipstick: NEGATIVE
Ketones, ur: 5 mg/dL — AB
Nitrite: POSITIVE — AB
PROTEIN: NEGATIVE mg/dL
Specific Gravity, Urine: 1.025 (ref 1.005–1.030)
pH: 5 (ref 5.0–8.0)

## 2016-09-25 LAB — WET PREP, GENITAL
CLUE CELLS WET PREP: NONE SEEN
Sperm: NONE SEEN
Trich, Wet Prep: NONE SEEN
WBC WET PREP: NONE SEEN
YEAST WET PREP: NONE SEEN

## 2016-09-25 MED ORDER — PHENAZOPYRIDINE HCL 200 MG PO TABS: 200.0000 mg | ORAL_TABLET | Freq: Three times a day (TID) | ORAL | 1 refills | Status: DC | PRN

## 2016-09-25 MED ORDER — CEPHALEXIN 500 MG PO CAPS: 500.0000 mg | ORAL_CAPSULE | Freq: Four times a day (QID) | ORAL | 2 refills | Status: DC

## 2016-09-25 MED ORDER — PHENAZOPYRIDINE HCL 200 MG PO TABS: 200.0000 mg | ORAL_TABLET | Freq: Three times a day (TID) | ORAL | 2 refills | Status: DC | PRN

## 2016-09-25 MED ORDER — CEPHALEXIN 500 MG PO CAPS: 500.0000 mg | ORAL_CAPSULE | Freq: Four times a day (QID) | ORAL | 1 refills | Status: DC

## 2016-09-25 NOTE — MAU Note (Signed)
Pt thinks she has a UTI, always has lower back pain, has been worse the last 2 days.  Vomited all day yesterday, none today.  No diarrhea.  Lower abdominal tenderness, denies bleeding.  Has dysuria, unsure about fever.

## 2016-09-25 NOTE — MAU Provider Note (Signed)
[facility] MAU Attending Note  History     CSN: [id number]  Arrival date & time 09/25/16  1425   First Provider Initiated Contact with Patient 09/25/16 1550      Chief Complaint  Patient presents with  . Back Pain  . Dysuria    [name] is a 36 y.o. [id number] at [redacted]w[redacted]d who presents to MAU today for evaluation of dysuria, lower abdominal pain, back pain, chills at home for the past week. She feels she has a UTI, reports having frequent UTIs.  Symptoms actually started in the last month but got worse over last week. Also reports seeing white, nonpruritic vaginal discharge. Denies any vaginal bleeding, fevers, nausea, vomiting, other GI or GU symptoms or other general symptoms.  Patient is on methadone, followed at [facility] office.    Obstetric History   G6   P2   T2   P0   A3   L2    SAB1   TAB2   Ectopic0   Multiple0   Live Births0     # Outcome Date GA Lbr Len/2nd Weight Sex Delivery Anes PTL Lv  6 Current           5 SAB 09/2012          4 Term 03/29/09 [redacted]w[redacted]d   M Vag-Spont     3 Term 03/13/06 [redacted]w[redacted]d   F Vag-Spont     2 TAB           1 TAB               Past Medical History:  Diagnosis Date  . 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 Hepatitis C    2014  . Herniated disc   . 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 Shoulder dislocation, recurrent     History reviewed. No pertinent surgical history.  Family History  Problem Relation Age of Onset  . COPD Mother   . Drug abuse Mother   . Alcohol abuse Mother   . Hypertension Mother   . Arthritis Father   . Diabetes Father   . Alcohol abuse Father   . Drug abuse Father   . Hypertension Father     Social History  Substance Use Topics  . Smoking status: Former Games developer  . Smokeless tobacco: Never Used  . Alcohol use No    No Known Allergies  No prescriptions prior to admission.     Physical Exam  BP 108/66   Pulse 81   Temp 98.4 F (36.9 C) (Oral)   Resp 16   LMP  07/22/2016  GENERAL: Well-developed, well-nourished female in no acute distress  SKIN: Warm, dry and without erythema PSYCH: Normal mood and affect HEENT: Normocephalic, atraumatic.   LUNGS: Normal respiratory effort, normal breath sounds HEART: Regular rate noted ABDOMEN: Soft, nondistended, nontender,gravid BACK: No CVAT PELVIC: Scant discharge noted, no erythema or irritation. Wet prep obtained. EXTREMITIES: No edema, no cyanosis, normal range of movement  MAU Course/MDM  Wet prep and UA ordered Bedside scan showed viable IUP with FHR in 160s.  Labs and Imaging   Results for orders placed or performed during the hospital encounter of 09/25/16 (from the past 24 hour(s))  Urinalysis, Routine w reflex microscopic     Status: Abnormal   Collection Time: 09/25/16  2:51 PM  Result Value Ref Range   Color, Urine YELLOW YELLOW   APPearance HAZY (A) CLEAR   Specific Gravity, Urine 1.025 1.005 -  1.030   pH 5.0 5.0 - 8.0   Glucose, UA NEGATIVE NEGATIVE mg/dL   Hgb urine dipstick NEGATIVE NEGATIVE   Bilirubin Urine NEGATIVE NEGATIVE   Ketones, ur 5 (A) NEGATIVE mg/dL   Protein, ur NEGATIVE NEGATIVE mg/dL   Nitrite POSITIVE (A) NEGATIVE   Leukocytes, UA SMALL (A) NEGATIVE   RBC / HPF 0-5 0 - 5 RBC/hpf   WBC, UA TOO NUMEROUS TO COUNT 0 - 5 WBC/hpf   Bacteria, UA MANY (A) NONE SEEN   Squamous Epithelial / LPF 0-5 (A) NONE SEEN   Mucous PRESENT   Wet prep, genital     Status: None   Collection Time: 09/25/16  3:53 PM  Result Value Ref Range   Yeast Wet Prep HPF POC NONE SEEN NONE SEEN   Trich, Wet Prep NONE SEEN NONE SEEN   Clue Cells Wet Prep HPF POC NONE SEEN NONE SEEN   WBC, Wet Prep HPF POC NONE SEEN NONE SEEN   Sperm NONE SEEN     Assessment and Plan   1. UTI (urinary tract infection) during pregnancy, first trimester    IUP at 2264w0d with UTI.  No signs of pyelonephritis. Urine culture ordered and pending Keflex and Pyridium ordered.  Was told to return to MAU for any  fevers, pain, bleeding or other concerns, or if her condition were to change or worsen. Discharged to home in stable condition; new OB appointment scheduled on 10/02/16. Patient interested in first trimester screening, NT scan scheduled on 10/16/16 at 0930.     Allergies as of 09/25/2016   No Known Allergies     Medication List    TAKE these medications   acetaminophen 325 MG tablet Commonly known as:  TYLENOL Take 650 mg by mouth every 6 (six) hours as needed.   cephALEXin 500 MG capsule Commonly known as:  KEFLEX Take 1 capsule (500 mg total) by mouth 4 (four) times daily.   methadone 10 MG/ML solution Commonly known as:  DOLOPHINE Take 50 mg by mouth daily.   phenazopyridine 200 MG tablet Commonly known as:  PYRIDIUM Take 1 tablet (200 mg total) by mouth 3 (three) times daily as needed for pain (urethral spasm).   Prenatal Vitamins 28-0.8 MG Tabs Take 1 tablet by mouth daily.        [name]  Karen Ledin, MD, FACOG Attending Obstetrician & Gynecologist, [facility], [facility]

## 2016-09-25 NOTE — Discharge Instructions (Signed)

## 2016-09-28 LAB — URINE CULTURE
Culture: >=100000 — AB
Special Requests: NORMAL

## 2016-10-02 ENCOUNTER — Encounter: Payer: Self-pay | Admitting: Obstetrics and Gynecology

## 2016-10-02 ENCOUNTER — Ambulatory Visit (INDEPENDENT_AMBULATORY_CARE_PROVIDER_SITE_OTHER): Payer: Medicaid Other | Admitting: Obstetrics and Gynecology

## 2016-10-02 VITALS — BP 107/65 | HR 69 | Wt 180.6 lb

## 2016-10-02 DIAGNOSIS — Z1151 Encounter for screening for human papillomavirus (HPV): Secondary | ICD-10-CM | POA: Diagnosis not present

## 2016-10-02 DIAGNOSIS — B182 Chronic viral hepatitis C: Secondary | ICD-10-CM

## 2016-10-02 DIAGNOSIS — O09521 Supervision of elderly multigravida, first trimester: Secondary | ICD-10-CM

## 2016-10-02 DIAGNOSIS — O98411 Viral hepatitis complicating pregnancy, first trimester: Secondary | ICD-10-CM | POA: Diagnosis present

## 2016-10-02 DIAGNOSIS — Z3200 Encounter for pregnancy test, result unknown: Secondary | ICD-10-CM

## 2016-10-02 DIAGNOSIS — O99321 Drug use complicating pregnancy, first trimester: Secondary | ICD-10-CM

## 2016-10-02 DIAGNOSIS — O98419 Viral hepatitis complicating pregnancy, unspecified trimester: Secondary | ICD-10-CM

## 2016-10-02 DIAGNOSIS — F1120 Opioid dependence, uncomplicated: Secondary | ICD-10-CM | POA: Diagnosis not present

## 2016-10-02 DIAGNOSIS — O09529 Supervision of elderly multigravida, unspecified trimester: Secondary | ICD-10-CM | POA: Insufficient documentation

## 2016-10-02 DIAGNOSIS — O0991 Supervision of high risk pregnancy, unspecified, first trimester: Secondary | ICD-10-CM

## 2016-10-02 MED ORDER — PRENATAL VITAMINS 28-0.8 MG PO TABS: 1.0000 | ORAL_TABLET | Freq: Every day | ORAL | 12 refills | Status: DC

## 2016-10-02 NOTE — Patient Instructions (Addendum)
 First Trimester of Pregnancy The first trimester of pregnancy is from week 1 until the end of week 13 (months 1 through 3). A week after a sperm fertilizes an egg, the egg will implant on the wall of the uterus. This embryo will begin to develop into a baby. Genes from you and your partner will form the baby. The female genes will determine whether the baby will be a boy or a girl. At 6-8 weeks, the eyes and face will be formed, and the heartbeat can be seen on ultrasound. At the end of 12 weeks, all the baby's organs will be formed. Now that you are pregnant, you will want to do everything you can to have a healthy baby. Two of the most important things are to get good prenatal care and to follow your health care provider's instructions. Prenatal care is all the medical care you receive before the baby's birth. This care will help prevent, find, and treat any problems during the pregnancy and childbirth. Body changes during your first trimester Your body goes through many changes during pregnancy. The changes vary from woman to woman.  You may gain or lose a couple of pounds at first.  You may feel sick to your stomach (nauseous) and you may throw up (vomit). If the vomiting is uncontrollable, call your health care provider.  You may tire easily.  You may develop headaches that can be relieved by medicines. All medicines should be approved by your health care provider.  You may urinate more often. Painful urination may mean you have a bladder infection.  You may develop heartburn as a result of your pregnancy.  You may develop constipation because certain hormones are causing the muscles that push stool through your intestines to slow down.  You may develop hemorrhoids or swollen veins (varicose veins).  Your breasts may begin to grow larger and become tender. Your nipples may stick out more, and the tissue that surrounds them (areola) may become darker.  Your gums may bleed and may be  sensitive to brushing and flossing.  Dark spots or blotches (chloasma, mask of pregnancy) may develop on your face. This will likely fade after the baby is born.  Your menstrual periods will stop.  You may have a loss of appetite.  You may develop cravings for certain kinds of food.  You may have changes in your emotions from day to day, such as being excited to be pregnant or being concerned that something may go wrong with the pregnancy and baby.  You may have more vivid and strange dreams.  You may have changes in your hair. These can include thickening of your hair, rapid growth, and changes in texture. Some women also have hair loss during or after pregnancy, or hair that feels dry or thin. Your hair will most likely return to normal after your baby is born.  What to expect at prenatal visits During a routine prenatal visit:  You will be weighed to make sure you and the baby are growing normally.  Your blood pressure will be taken.  Your abdomen will be measured to track your baby's growth.  The fetal heartbeat will be listened to between weeks 10 and 14 of your pregnancy.  Test results from any previous visits will be discussed.  Your health care provider may ask you:  How you are feeling.  If you are feeling the baby move.  If you have had any abnormal symptoms, such as leaking fluid, bleeding, severe   headaches, or abdominal cramping.  If you are using any tobacco products, including cigarettes, chewing tobacco, and electronic cigarettes.  If you have any questions.  Other tests that may be performed during your first trimester include:  Blood tests to find your blood type and to check for the presence of any previous infections. The tests will also be used to check for low iron levels (anemia) and protein on red blood cells (Rh antibodies). Depending on your risk factors, or if you previously had diabetes during pregnancy, you may have tests to check for high blood  sugar that affects pregnant women (gestational diabetes).  Urine tests to check for infections, diabetes, or protein in the urine.  An ultrasound to confirm the proper growth and development of the baby.  Fetal screens for spinal cord problems (spina bifida) and Down syndrome.  HIV (human immunodeficiency virus) testing. Routine prenatal testing includes screening for HIV, unless you choose not to have this test.  You may need other tests to make sure you and the baby are doing well.  Follow these instructions at home: Medicines  Follow your health care provider's instructions regarding medicine use. Specific medicines may be either safe or unsafe to take during pregnancy.  Take a prenatal vitamin that contains at least 600 micrograms (mcg) of folic acid.  If you develop constipation, try taking a stool softener if your health care provider approves. Eating and drinking  Eat a balanced diet that includes fresh fruits and vegetables, whole grains, good sources of protein such as meat, eggs, or tofu, and low-fat dairy. Your health care provider will help you determine the amount of weight gain that is right for you.  Avoid raw meat and uncooked cheese. These carry germs that can cause birth defects in the baby.  Eating four or five small meals rather than three large meals a day may help relieve nausea and vomiting. If you start to feel nauseous, eating a few soda crackers can be helpful. Drinking liquids between meals, instead of during meals, also seems to help ease nausea and vomiting.  Limit foods that are high in fat and processed sugars, such as fried and sweet foods.  To prevent constipation: ? Eat foods that are high in fiber, such as fresh fruits and vegetables, whole grains, and beans. ? Drink enough fluid to keep your urine clear or pale yellow. Activity  Exercise only as directed by your health care provider. Most women can continue their usual exercise routine during  pregnancy. Try to exercise for 30 minutes at least 5 days a week. Exercising will help you: ? Control your weight. ? Stay in shape. ? Be prepared for labor and delivery.  Experiencing pain or cramping in the lower abdomen or lower back is a good sign that you should stop exercising. Check with your health care provider before continuing with normal exercises.  Try to avoid standing for long periods of time. Move your legs often if you must stand in one place for a long time.  Avoid heavy lifting.  Wear low-heeled shoes and practice good posture.  You may continue to have sex unless your health care provider tells you not to. Relieving pain and discomfort  Wear a good support bra to relieve breast tenderness.  Take warm sitz baths to soothe any pain or discomfort caused by hemorrhoids. Use hemorrhoid cream if your health care provider approves.  Rest with your legs elevated if you have leg cramps or low back pain.  If you   develop varicose veins in your legs, wear support hose. Elevate your feet for 15 minutes, 3-4 times a day. Limit salt in your diet. Prenatal care  Schedule your prenatal visits by the twelfth week of pregnancy. They are usually scheduled monthly at first, then more often in the last 2 months before delivery.  Write down your questions. Take them to your prenatal visits.  Keep all your prenatal visits as told by your health care provider. This is important. Safety  Wear your seat belt at all times when driving.  Make a list of emergency phone numbers, including numbers for family, friends, the hospital, and police and fire departments. General instructions  Ask your health care provider for a referral to a local prenatal education class. Begin classes no later than the beginning of month 6 of your pregnancy.  Ask for help if you have counseling or nutritional needs during pregnancy. Your health care provider can offer advice or refer you to specialists for help  with various needs.  Do not use hot tubs, steam rooms, or saunas.  Do not douche or use tampons or scented sanitary pads.  Do not cross your legs for long periods of time.  Avoid cat litter boxes and soil used by cats. These carry germs that can cause birth defects in the baby and possibly loss of the fetus by miscarriage or stillbirth.  Avoid all smoking, herbs, alcohol, and medicines not prescribed by your health care provider. Chemicals in these products affect the formation and growth of the baby.  Do not use any products that contain nicotine or tobacco, such as cigarettes and e-cigarettes. If you need help quitting, ask your health care provider. You may receive counseling support and other resources to help you quit.  Schedule a dentist appointment. At home, brush your teeth with a soft toothbrush and be gentle when you floss. Contact a health care provider if:  You have dizziness.  You have mild pelvic cramps, pelvic pressure, or nagging pain in the abdominal area.  You have persistent nausea, vomiting, or diarrhea.  You have a bad smelling vaginal discharge.  You have pain when you urinate.  You notice increased swelling in your face, hands, legs, or ankles.  You are exposed to fifth disease or chickenpox.  You are exposed to German measles (rubella) and have never had it. Get help right away if:  You have a fever.  You are leaking fluid from your vagina.  You have spotting or bleeding from your vagina.  You have severe abdominal cramping or pain.  You have rapid weight gain or loss.  You vomit blood or material that looks like coffee grounds.  You develop a severe headache.  You have shortness of breath.  You have any kind of trauma, such as from a fall or a car accident. Summary  The first trimester of pregnancy is from week 1 until the end of week 13 (months 1 through 3).  Your body goes through many changes during pregnancy. The changes vary from  woman to woman.  You will have routine prenatal visits. During those visits, your health care provider will examine you, discuss any test results you may have, and talk with you about how you are feeling. This information is not intended to replace advice given to you by your health care provider. Make sure you discuss any questions you have with your health care provider. Document Released: 06/25/2001 Document Revised: 06/12/2016 Document Reviewed: 06/12/2016 Elsevier Interactive Patient Education  2017   [employer]  Contraception Choices Contraception (birth control) is the use of any methods or devices to prevent pregnancy. Below are some methods to help avoid pregnancy. Hormonal methods  Contraceptive implant. This is a thin, plastic tube containing progesterone hormone. It does not contain estrogen hormone. Your health care provider inserts the tube in the inner part of the upper arm. The tube can remain in place for up to 3 years. After 3 years, the implant must be removed. The implant prevents the ovaries from releasing an egg (ovulation), thickens the cervical mucus to prevent sperm from entering the uterus, and thins the lining of the inside of the uterus.  Progesterone-only injections. These injections are given every 3 months by your health care provider to prevent pregnancy. This synthetic progesterone hormone stops the ovaries from releasing eggs. It also thickens cervical mucus and changes the uterine lining. This makes it harder for sperm to survive in the uterus.  Birth control pills. These pills contain estrogen and progesterone hormone. They work by preventing the ovaries from releasing eggs (ovulation). They also cause the cervical mucus to thicken, preventing the sperm from entering the uterus. Birth control pills are prescribed by a health care provider.Birth control pills can also be used to treat heavy periods.  Minipill. This type of birth control pill contains only the  progesterone hormone. They are taken every day of each month and must be prescribed by your health care provider.  Birth control patch. The patch contains hormones similar to those in birth control pills. It must be changed once a week and is prescribed by a health care provider.  Vaginal ring. The ring contains hormones similar to those in birth control pills. It is left in the vagina for 3 weeks, removed for 1 week, and then a new one is put back in place. The patient must be comfortable inserting and removing the ring from the vagina.A health care provider's prescription is necessary.  Emergency contraception. Emergency contraceptives prevent pregnancy after unprotected sexual intercourse. This pill can be taken right after sex or up to 5 days after unprotected sex. It is most effective the sooner you take the pills after having sexual intercourse. Most emergency contraceptive pills are available without a prescription. Check with your pharmacist. Do not use emergency contraception as your only form of birth control. Barrier methods  Female condom. This is a thin sheath (latex or rubber) that is worn over the penis during sexual intercourse. It can be used with spermicide to increase effectiveness.  Female condom. This is a soft, loose-fitting sheath that is put into the vagina before sexual intercourse.  Diaphragm. This is a soft, latex, dome-shaped barrier that must be fitted by a health care provider. It is inserted into the vagina, along with a spermicidal jelly. It is inserted before intercourse. The diaphragm should be left in the vagina for 6 to 8 hours after intercourse.  Cervical cap. This is a round, soft, latex or plastic cup that fits over the cervix and must be fitted by a health care provider. The cap can be left in place for up to 48 hours after intercourse.  Sponge. This is a soft, circular piece of polyurethane foam. The sponge has spermicide in it. It is inserted into the vagina  after wetting it and before sexual intercourse.  Spermicides. These are chemicals that kill or block sperm from entering the cervix and uterus. They come in the form of creams, jellies, suppositories, foam, or tablets. They do not   require a prescription. They are inserted into the vagina with an applicator before having sexual intercourse. The process must be repeated every time you have sexual intercourse. Intrauterine contraception  Intrauterine device (IUD). This is a T-shaped device that is put in a woman's uterus during a menstrual period to prevent pregnancy. There are 2 types: ? Copper IUD. This type of IUD is wrapped in copper wire and is placed inside the uterus. Copper makes the uterus and fallopian tubes produce a fluid that kills sperm. It can stay in place for 10 years. ? Hormone IUD. This type of IUD contains the hormone progestin (synthetic progesterone). The hormone thickens the cervical mucus and prevents sperm from entering the uterus, and it also thins the uterine lining to prevent implantation of a fertilized egg. The hormone can weaken or kill the sperm that get into the uterus. It can stay in place for 3-5 years, depending on which type of IUD is used. Permanent methods of contraception  Female tubal ligation. This is when the woman's fallopian tubes are surgically sealed, tied, or blocked to prevent the egg from traveling to the uterus.  Hysteroscopic sterilization. This involves placing a small coil or insert into each fallopian tube. Your doctor uses a technique called hysteroscopy to do the procedure. The device causes scar tissue to form. This results in permanent blockage of the fallopian tubes, so the sperm cannot fertilize the egg. It takes about 3 months after the procedure for the tubes to become blocked. You must use another form of birth control for these 3 months.  Female sterilization. This is when the female has the tubes that carry sperm tied off (vasectomy).This  blocks sperm from entering the vagina during sexual intercourse. After the procedure, the man can still ejaculate fluid (semen). Natural planning methods  Natural family planning. This is not having sexual intercourse or using a barrier method (condom, diaphragm, cervical cap) on days the woman could become pregnant.  Calendar method. This is keeping track of the length of each menstrual cycle and identifying when you are fertile.  Ovulation method. This is avoiding sexual intercourse during ovulation.  Symptothermal method. This is avoiding sexual intercourse during ovulation, using a thermometer and ovulation symptoms.  Post-ovulation method. This is timing sexual intercourse after you have ovulated. Regardless of which type or method of contraception you choose, it is important that you use condoms to protect against the transmission of sexually transmitted infections (STIs). Talk with your health care provider about which form of contraception is most appropriate for you. This information is not intended to replace advice given to you by your health care provider. Make sure you discuss any questions you have with your health care provider. Document Released: 07/01/2005 Document Revised: 12/07/2015 Document Reviewed: 12/24/2012 Elsevier Interactive Patient Education  2017 [employer]   Breastfeeding Deciding to breastfeed is one of the best choices you can make for you and your baby. A change in hormones during pregnancy causes your breast tissue to grow and increases the number and size of your milk ducts. These hormones also allow proteins, sugars, and fats from your blood supply to make breast milk in your milk-producing glands. Hormones prevent breast milk from being released before your baby is born as well as prompt milk flow after birth. Once breastfeeding has begun, thoughts of your baby, as well as his or her sucking or crying, can stimulate the release of milk from your  milk-producing glands. Benefits of breastfeeding For Your Baby  Your   first milk (colostrum) helps your baby's digestive system function better.  There are antibodies in your milk that help your baby fight off infections.  Your baby has a lower incidence of asthma, allergies, and sudden infant death syndrome.  The nutrients in breast milk are better for your baby than infant formulas and are designed uniquely for your baby's needs.  Breast milk improves your baby's brain development.  Your baby is less likely to develop other conditions, such as childhood obesity, asthma, or type 2 diabetes mellitus.  For You  Breastfeeding helps to create a very special bond between you and your baby.  Breastfeeding is convenient. Breast milk is always available at the correct temperature and costs nothing.  Breastfeeding helps to burn calories and helps you lose the weight gained during pregnancy.  Breastfeeding makes your uterus contract to its prepregnancy size faster and slows bleeding (lochia) after you give birth.  Breastfeeding helps to lower your risk of developing type 2 diabetes mellitus, osteoporosis, and breast or ovarian cancer later in life.  Signs that your baby is hungry Early Signs of Hunger  Increased alertness or activity.  Stretching.  Movement of the head from side to side.  Movement of the head and opening of the mouth when the corner of the mouth or cheek is stroked (rooting).  Increased sucking sounds, smacking lips, cooing, sighing, or squeaking.  Hand-to-mouth movements.  Increased sucking of fingers or hands.  Late Signs of Hunger  Fussing.  Intermittent crying.  Extreme Signs of Hunger Signs of extreme hunger will require calming and consoling before your baby will be able to breastfeed successfully. Do not wait for the following signs of extreme hunger to occur before you initiate breastfeeding:  Restlessness.  A loud, strong  cry.  Screaming.  Breastfeeding basics Breastfeeding Initiation  Find a comfortable place to sit or lie down, with your neck and back well supported.  Place a pillow or rolled up blanket under your baby to bring him or her to the level of your breast (if you are seated). Nursing pillows are specially designed to help support your arms and your baby while you breastfeed.  Make sure that your baby's abdomen is facing your abdomen.  Gently massage your breast. With your fingertips, massage from your chest wall toward your nipple in a circular motion. This encourages milk flow. You may need to continue this action during the feeding if your milk flows slowly.  Support your breast with 4 fingers underneath and your thumb above your nipple. Make sure your fingers are well away from your nipple and your baby's mouth.  Stroke your baby's lips gently with your finger or nipple.  When your baby's mouth is open wide enough, quickly bring your baby to your breast, placing your entire nipple and as much of the colored area around your nipple (areola) as possible into your baby's mouth. ? More areola should be visible above your baby's upper lip than below the lower lip. ? Your baby's tongue should be between his or her lower gum and your breast.  Ensure that your baby's mouth is correctly positioned around your nipple (latched). Your baby's lips should create a seal on your breast and be turned out (everted).  It is common for your baby to suck about 2-3 minutes in order to start the flow of breast milk.  Latching Teaching your baby how to latch on to your breast properly is very important. An improper latch can cause nipple pain and decreased   milk supply for you and poor weight gain in your baby. Also, if your baby is not latched onto your nipple properly, he or she may swallow some air during feeding. This can make your baby fussy. Burping your baby when you switch breasts during the feeding can  help to get rid of the air. However, teaching your baby to latch on properly is still the best way to prevent fussiness from swallowing air while breastfeeding. Signs that your baby has successfully latched on to your nipple:  Silent tugging or silent sucking, without causing you pain.  Swallowing heard between every 3-4 sucks.  Muscle movement above and in front of his or her ears while sucking.  Signs that your baby has not successfully latched on to nipple:  Sucking sounds or smacking sounds from your baby while breastfeeding.  Nipple pain.  If you think your baby has not latched on correctly, slip your finger into the corner of your baby's mouth to break the suction and place it between your baby's gums. Attempt breastfeeding initiation again. Signs of Successful Breastfeeding Signs from your baby:  A gradual decrease in the number of sucks or complete cessation of sucking.  Falling asleep.  Relaxation of his or her body.  Retention of a small amount of milk in his or her mouth.  Letting go of your breast by himself or herself.  Signs from you:  Breasts that have increased in firmness, weight, and size 1-3 hours after feeding.  Breasts that are softer immediately after breastfeeding.  Increased milk volume, as well as a change in milk consistency and color by the fifth day of breastfeeding.  Nipples that are not sore, cracked, or bleeding.  Signs That Your Baby is Getting Enough Milk  Wetting at least 1-2 diapers during the first 24 hours after birth.  Wetting at least 5-6 diapers every 24 hours for the first week after birth. The urine should be clear or pale yellow by 5 days after birth.  Wetting 6-8 diapers every 24 hours as your baby continues to grow and develop.  At least 3 stools in a 24-hour period by age 5 days. The stool should be soft and yellow.  At least 3 stools in a 24-hour period by age 7 days. The stool should be seedy and yellow.  No loss of  weight greater than 10% of birth weight during the first 3 days of age.  Average weight gain of 4-7 ounces (113-198 g) per week after age 4 days.  Consistent daily weight gain by age 5 days, without weight loss after the age of 2 weeks.  After a feeding, your baby may spit up a small amount. This is common. Breastfeeding frequency and duration Frequent feeding will help you make more milk and can prevent sore nipples and breast engorgement. Breastfeed when you feel the need to reduce the fullness of your breasts or when your baby shows signs of hunger. This is called "breastfeeding on demand." Avoid introducing a pacifier to your baby while you are working to establish breastfeeding (the first 4-6 weeks after your baby is born). After this time you may choose to use a pacifier. Research has shown that pacifier use during the first year of a baby's life decreases the risk of sudden infant death syndrome (SIDS). Allow your baby to feed on each breast as long as he or she wants. Breastfeed until your baby is finished feeding. When your baby unlatches or falls asleep while feeding from the first   breast, offer the second breast. Because newborns are often sleepy in the first few weeks of life, you may need to awaken your baby to get him or her to feed. Breastfeeding times will vary from baby to baby. However, the following rules can serve as a guide to help you ensure that your baby is properly fed:  Newborns (babies 4 weeks of age or younger) may breastfeed every 1-3 hours.  Newborns should not go longer than 3 hours during the day or 5 hours during the night without breastfeeding.  You should breastfeed your baby a minimum of 8 times in a 24-hour period until you begin to introduce solid foods to your baby at around 6 months of age.  Breast milk pumping Pumping and storing breast milk allows you to ensure that your baby is exclusively fed your breast milk, even at times when you are unable to  breastfeed. This is especially important if you are going back to work while you are still breastfeeding or when you are not able to be present during feedings. Your lactation consultant can give you guidelines on how long it is safe to store breast milk. A breast pump is a machine that allows you to pump milk from your breast into a sterile bottle. The pumped breast milk can then be stored in a refrigerator or freezer. Some breast pumps are operated by hand, while others use electricity. Ask your lactation consultant which type will work best for you. Breast pumps can be purchased, but some hospitals and breastfeeding support groups lease breast pumps on a monthly basis. A lactation consultant can teach you how to hand express breast milk, if you prefer not to use a pump. Caring for your breasts while you breastfeed Nipples can become dry, cracked, and sore while breastfeeding. The following recommendations can help keep your breasts moisturized and healthy:  Avoid using soap on your nipples.  Wear a supportive bra. Although not required, special nursing bras and tank tops are designed to allow access to your breasts for breastfeeding without taking off your entire bra or top. Avoid wearing underwire-style bras or extremely tight bras.  Air dry your nipples for 3-4minutes after each feeding.  Use only cotton bra pads to absorb leaked breast milk. Leaking of breast milk between feedings is normal.  Use lanolin on your nipples after breastfeeding. Lanolin helps to maintain your skin's normal moisture barrier. If you use pure lanolin, you do not need to wash it off before feeding your baby again. Pure lanolin is not toxic to your baby. You may also hand express a few drops of breast milk and gently massage that milk into your nipples and allow the milk to air dry.  In the first few weeks after giving birth, some women experience extremely full breasts (engorgement). Engorgement can make your breasts  feel heavy, warm, and tender to the touch. Engorgement peaks within 3-5 days after you give birth. The following recommendations can help ease engorgement:  Completely empty your breasts while breastfeeding or pumping. You may want to start by applying warm, moist heat (in the shower or with warm water-soaked hand towels) just before feeding or pumping. This increases circulation and helps the milk flow. If your baby does not completely empty your breasts while breastfeeding, pump any extra milk after he or she is finished.  Wear a snug bra (nursing or regular) or tank top for 1-2 days to signal your body to slightly decrease milk production.  Apply ice packs to   your breasts, unless this is too uncomfortable for you.  Make sure that your baby is latched on and positioned properly while breastfeeding.  If engorgement persists after 48 hours of following these recommendations, contact your health care provider or a lactation consultant. Overall health care recommendations while breastfeeding  Eat healthy foods. Alternate between meals and snacks, eating 3 of each per day. Because what you eat affects your breast milk, some of the foods may make your baby more irritable than usual. Avoid eating these foods if you are sure that they are negatively affecting your baby.  Drink milk, fruit juice, and water to satisfy your thirst (about 10 glasses a day).  Rest often, relax, and continue to take your prenatal vitamins to prevent fatigue, stress, and anemia.  Continue breast self-awareness checks.  Avoid chewing and smoking tobacco. Chemicals from cigarettes that pass into breast milk and exposure to secondhand smoke may harm your baby.  Avoid alcohol and drug use, including marijuana. Some medicines that may be harmful to your baby can pass through breast milk. It is important to ask your health care provider before taking any medicine, including all over-the-counter and prescription medicine as well  as vitamin and herbal supplements. It is possible to become pregnant while breastfeeding. If birth control is desired, ask your health care provider about options that will be safe for your baby. Contact a health care provider if:  You feel like you want to stop breastfeeding or have become frustrated with breastfeeding.  You have painful breasts or nipples.  Your nipples are cracked or bleeding.  Your breasts are red, tender, or warm.  You have a swollen area on either breast.  You have a fever or chills.  You have nausea or vomiting.  You have drainage other than breast milk from your nipples.  Your breasts do not become full before feedings by the fifth day after you give birth.  You feel sad and depressed.  Your baby is too sleepy to eat well.  Your baby is having trouble sleeping.  Your baby is wetting less than 3 diapers in a 24-hour period.  Your baby has less than 3 stools in a 24-hour period.  Your baby's skin or the white part of his or her eyes becomes yellow.  Your baby is not gaining weight by 5 days of age. Get help right away if:  Your baby is overly tired (lethargic) and does not want to wake up and feed.  Your baby develops an unexplained fever. This information is not intended to replace advice given to you by your health care provider. Make sure you discuss any questions you have with your health care provider. Document Released: 07/01/2005 Document Revised: 12/13/2015 Document Reviewed: 12/23/2012 Elsevier Interactive Patient Education  2017 Elsevier Inc.  

## 2016-10-02 NOTE — Progress Notes (Signed)
Subjective:    [name] is a [id number] being seen today for her first obstetrical visit.  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 Patient was not able to follow up with GI to initiate treatment due to a lack of insurrance since 2014. Patient does intend to breast feed. Pregnancy history fully reviewed.  Patient reports no complaints.  Vitals:   10/02/16 1335  BP: 107/65  Pulse: 69  Weight: 180 lb 9.6 oz (81.9 kg)    HISTORY: OB History  Gravida Para Term Preterm AB Living  6 2 2  0 3 2  SAB TAB Ectopic Multiple Live Births  1 2          # Outcome Date GA Lbr Len/2nd Weight Sex Delivery Anes PTL Lv  6 Current           5 SAB 09/2012          4 Term 03/29/09 4563w0d   M Vag-Spont     3 Term 03/13/06 4963w0d   F Vag-Spont     2 TAB           1 TAB              Past Medical History:  Diagnosis Date  . 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 Hepatitis C    2014  . Herniated disc   . 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 Shoulder dislocation, recurrent    History reviewed. No pertinent surgical history. Family History  Problem Relation Age of Onset  . COPD Mother   . Drug abuse Mother   . Alcohol abuse Mother   . Hypertension Mother   . Arthritis Father   . Diabetes Father   . Alcohol abuse Father   . Drug abuse Father   . Hypertension Father      Exam    Uterus:     Pelvic Exam:    Perineum: No Hemorrhoids, Normal Perineum   Vulva: normal   Vagina:  normal mucosa, normal discharge   pH:    Cervix: multiparous appearance and cervix is closed and long   Adnexa: normal adnexa and no mass, fullness, tenderness   Bony Pelvis: gynecoid  System: Breast:  normal appearance, no masses or tenderness   Skin: normal coloration and turgor, no rashes    Neurologic: oriented, no focal deficits   Extremities: normal strength, tone, and  muscle mass   HEENT extra ocular movement intact   Mouth/Teeth mucous membranes moist, pharynx normal without lesions and dental hygiene good   Neck supple and no masses   Cardiovascular: regular rate and rhythm   Respiratory:  chest clear, no wheezing, crepitations, rhonchi, normal symmetric air entry   Abdomen: soft, non-tender; bowel sounds normal; no masses,  no organomegaly   Urinary:       Assessment:    Pregnancy: [id number] Patient Active Problem List   Diagnosis Date Noted  . 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 Supervision of high-risk pregnancy 09/25/2016  . Hepatitis C, chronic, maternal, antepartum (HCC) 09/25/2016  . Heroin dependence (HCC) 05/22/2011        Plan:     Initial labs drawn. Prenatal vitamins. Problem list reviewed and updated. Genetic Screening discussed First Screen: scheduled on 4/4.  Ultrasound discussed; fetal survey: requested. Quant Hep C ordered. Patient understands that treatment will be deferred to postpartum period Patient has  been compliant with methadone   Follow up in 4 weeks. 50% of 30 min visit spent on counseling and coordination of care.     [name] 10/02/2016

## 2016-10-03 ENCOUNTER — Encounter: Payer: Self-pay | Admitting: Obstetrics and Gynecology

## 2016-10-03 ENCOUNTER — Other Ambulatory Visit: Payer: Self-pay | Admitting: Obstetrics and Gynecology

## 2016-10-03 DIAGNOSIS — Z283 Underimmunization status: Secondary | ICD-10-CM | POA: Insufficient documentation

## 2016-10-03 DIAGNOSIS — O9989 Other specified diseases and conditions complicating pregnancy, childbirth and the puerperium: Secondary | ICD-10-CM

## 2016-10-03 DIAGNOSIS — O09899 Supervision of other high risk pregnancies, unspecified trimester: Secondary | ICD-10-CM | POA: Insufficient documentation

## 2016-10-03 DIAGNOSIS — B182 Chronic viral hepatitis C: Secondary | ICD-10-CM

## 2016-10-03 LAB — COMPREHENSIVE METABOLIC PANEL
ALT: 46 IU/L — AB (ref 0–32)
AST: 57 IU/L — AB (ref 0–40)
Albumin/Globulin Ratio: 1.5 (ref 1.2–2.2)
Albumin: 4.1 g/dL (ref 3.5–5.5)
Alkaline Phosphatase: 37 IU/L — ABNORMAL LOW (ref 39–117)
BUN/Creatinine Ratio: 17 (ref 9–23)
BUN: 9 mg/dL (ref 6–20)
Bilirubin Total: <0.2 mg/dL (ref 0.0–1.2)
CALCIUM: 9.1 mg/dL (ref 8.7–10.2)
CO2: 21 mmol/L (ref 18–29)
CREATININE: 0.54 mg/dL — AB (ref 0.57–1.00)
Chloride: 100 mmol/L (ref 96–106)
GFR calc Af Amer: 140 mL/min/{1.73_m2} (ref >59)
GFR, EST NON AFRICAN AMERICAN: 122 mL/min/{1.73_m2} (ref >59)
Globulin, Total: 2.7 g/dL (ref 1.5–4.5)
Glucose: 71 mg/dL (ref 65–99)
POTASSIUM: 3.9 mmol/L (ref 3.5–5.2)
Sodium: 136 mmol/L (ref 134–144)
Total Protein: 6.8 g/dL (ref 6.0–8.5)

## 2016-10-03 LAB — HCV RNA QUANT
HCV LOG10: 6.021 {Log_IU}/mL
Hepatitis C Quantitation: 1050000 IU/mL

## 2016-10-03 LAB — PRENATAL PROFILE I(LABCORP)
Antibody Screen: NEGATIVE
Basophils Absolute: 0 10*3/uL (ref 0.0–0.2)
Basos: 0 %
EOS (ABSOLUTE): 0.1 10*3/uL (ref 0.0–0.4)
Eos: 1 %
HEMATOCRIT: 29.5 % — AB (ref 34.0–46.6)
HEMOGLOBIN: 10.1 g/dL — AB (ref 11.1–15.9)
Hepatitis B Surface Ag: NEGATIVE
IMMATURE GRANS (ABS): 0 10*3/uL (ref 0.0–0.1)
Immature Granulocytes: 0 %
Lymphocytes Absolute: 1.4 10*3/uL (ref 0.7–3.1)
Lymphs: 28 %
MCH: 29.4 pg (ref 26.6–33.0)
MCHC: 34.2 g/dL (ref 31.5–35.7)
MCV: 86 fL (ref 79–97)
MONOCYTES: 7 %
MONOS ABS: 0.4 10*3/uL (ref 0.1–0.9)
Neutrophils Absolute: 3.2 10*3/uL (ref 1.4–7.0)
Neutrophils: 64 %
Platelets: 242 10*3/uL (ref 150–379)
RBC: 3.44 x10E6/uL — ABNORMAL LOW (ref 3.77–5.28)
RDW: 14.4 % (ref 12.3–15.4)
RPR: NONREACTIVE
Rh Factor: POSITIVE
Rubella Antibodies, IGG: <0.9 index — ABNORMAL LOW (ref >0.99)
WBC: 5 10*3/uL (ref 3.4–10.8)

## 2016-10-03 LAB — HIV ANTIBODY (ROUTINE TESTING W REFLEX): HIV SCREEN 4TH GENERATION: NONREACTIVE

## 2016-10-06 LAB — CYTOLOGY - PAP: HPV (WINDOPATH): DETECTED — AB

## 2016-10-07 ENCOUNTER — Telehealth: Payer: Self-pay | Admitting: *Deleted

## 2016-10-07 DIAGNOSIS — O0991 Supervision of high risk pregnancy, unspecified, first trimester: Secondary | ICD-10-CM

## 2016-10-07 DIAGNOSIS — O98419 Viral hepatitis complicating pregnancy, unspecified trimester: Secondary | ICD-10-CM

## 2016-10-07 DIAGNOSIS — B182 Chronic viral hepatitis C: Secondary | ICD-10-CM

## 2016-10-07 DIAGNOSIS — O09521 Supervision of elderly multigravida, first trimester: Secondary | ICD-10-CM

## 2016-10-07 NOTE — Telephone Encounter (Signed)
Will call patient once we hear back from ID with her appointment.

## 2016-10-07 NOTE — Telephone Encounter (Signed)
Per message from Dr. Jolayne Pantheronstant need referral to GI for chronic Hepatitis C.  I called [facility] and they do not treat Hepatitis C. Sent referral to ID.

## 2016-10-08 ENCOUNTER — Telehealth: Payer: Self-pay

## 2016-10-08 NOTE — Telephone Encounter (Signed)
[facility]. Referral form completed and to be sent with records by front office staff. Called patient concerning pap results, no answer- left message to call us back regarding results

## 2016-10-08 NOTE — Telephone Encounter (Signed)
Pt also needs to be informed of abnormal Pap and need for Colposcopy. Per Dr. Jolayne Pantheronstant, this can be done at her next prenatal visit on 4/18 @ 1240.

## 2016-10-08 NOTE — Telephone Encounter (Signed)
Received call from Infections Disease department patient was referred over to there office. They office will not do anything about patient Hep C until after she has the baby..  Called patient to make her aware. No answer or voicemail to leave a message.

## 2016-10-16 ENCOUNTER — Other Ambulatory Visit: Payer: Self-pay | Admitting: *Deleted

## 2016-10-16 ENCOUNTER — Encounter (HOSPITAL_COMMUNITY): Payer: Self-pay

## 2016-10-16 ENCOUNTER — Ambulatory Visit (HOSPITAL_COMMUNITY)
Admission: RE | Admit: 2016-10-16 | Discharge: 2016-10-16 | Disposition: A | Discharge status: Home / Self Care | Payer: Medicaid Other | Source: Ambulatory Visit | Attending: Obstetrics & Gynecology | Admitting: Obstetrics & Gynecology

## 2016-10-16 ENCOUNTER — Other Ambulatory Visit (HOSPITAL_COMMUNITY): Payer: Self-pay | Admitting: *Deleted

## 2016-10-16 DIAGNOSIS — Z3682 Encounter for antenatal screening for nuchal translucency: Secondary | ICD-10-CM

## 2016-10-16 DIAGNOSIS — Z3A12 12 weeks gestation of pregnancy: Secondary | ICD-10-CM | POA: Diagnosis not present

## 2016-10-16 DIAGNOSIS — O09529 Supervision of elderly multigravida, unspecified trimester: Secondary | ICD-10-CM

## 2016-10-16 DIAGNOSIS — B182 Chronic viral hepatitis C: Secondary | ICD-10-CM | POA: Diagnosis not present

## 2016-10-16 DIAGNOSIS — O99321 Drug use complicating pregnancy, first trimester: Secondary | ICD-10-CM | POA: Diagnosis not present

## 2016-10-16 DIAGNOSIS — F1110 Opioid abuse, uncomplicated: Secondary | ICD-10-CM | POA: Diagnosis not present

## 2016-10-16 DIAGNOSIS — O98412 Viral hepatitis complicating pregnancy, second trimester: Secondary | ICD-10-CM | POA: Insufficient documentation

## 2016-10-16 DIAGNOSIS — O09521 Supervision of elderly multigravida, first trimester: Secondary | ICD-10-CM | POA: Insufficient documentation

## 2016-10-16 DIAGNOSIS — O219 Vomiting of pregnancy, unspecified: Secondary | ICD-10-CM

## 2016-10-16 MED ORDER — ONDANSETRON HCL 4 MG PO TABS: 4.0000 mg | ORAL_TABLET | Freq: Three times a day (TID) | ORAL | 0 refills | Status: DC | PRN

## 2016-10-16 MED ORDER — PRENATAL VITAMINS 0.8 MG PO TABS: 1.0000 | ORAL_TABLET | Freq: Every day | ORAL | 12 refills | Status: DC

## 2016-10-16 NOTE — Progress Notes (Signed)
Pt came to front desk requesting rx for zofran. Discussed patient with Dr. Erin Fulling and she agrees for rx.

## 2016-10-22 NOTE — Telephone Encounter (Signed)
I called [name] and notified her of referral to [facility] for hepatitis C and that she should hear from them soon. I also notified her of abnormal pap smear and need for colposcopy at the next ob visit next week. She has had abnormal pap smear before and did not have follow up due to loss of insurance.  Support given and questions answered- encouraged to ask any further questions to provider at visit.  Reviewed appt date/time with her. She voices understanding.

## 2016-10-30 ENCOUNTER — Ambulatory Visit (INDEPENDENT_AMBULATORY_CARE_PROVIDER_SITE_OTHER): Payer: Medicaid Other | Admitting: Family Medicine

## 2016-10-30 VITALS — BP 113/60 | HR 70 | Wt 181.1 lb

## 2016-10-30 DIAGNOSIS — Z283 Underimmunization status: Secondary | ICD-10-CM

## 2016-10-30 DIAGNOSIS — O9989 Other specified diseases and conditions complicating pregnancy, childbirth and the puerperium: Secondary | ICD-10-CM

## 2016-10-30 DIAGNOSIS — R87612 Low grade squamous intraepithelial lesion on cytologic smear of cervix (LGSIL): Secondary | ICD-10-CM

## 2016-10-30 DIAGNOSIS — F1120 Opioid dependence, uncomplicated: Secondary | ICD-10-CM

## 2016-10-30 DIAGNOSIS — O0991 Supervision of high risk pregnancy, unspecified, first trimester: Secondary | ICD-10-CM

## 2016-10-30 DIAGNOSIS — O99321 Drug use complicating pregnancy, first trimester: Principal | ICD-10-CM

## 2016-10-30 DIAGNOSIS — Z2839 Other underimmunization status: Secondary | ICD-10-CM

## 2016-10-30 DIAGNOSIS — O09521 Supervision of elderly multigravida, first trimester: Secondary | ICD-10-CM

## 2016-10-30 DIAGNOSIS — O98419 Viral hepatitis complicating pregnancy, unspecified trimester: Secondary | ICD-10-CM

## 2016-10-30 DIAGNOSIS — B182 Chronic viral hepatitis C: Secondary | ICD-10-CM

## 2016-10-30 NOTE — Patient Instructions (Signed)
Hepatitis C During Pregnancy Introduction Hepatitis C is a viral disease in which the liver becomes inflamed. WILL THE DISEASE SPREAD TO MY BABY? There are no vaccines or treatments available to lower the risk of spreading hepatitis C to your baby. However, the chance of your unborn baby (fetus) being infected is low. The risk of infection to your fetus is increased if:  You have a weakened immune system due to HIV infection.  You have a high hepatitis C virus (HCV) count in your blood.  You have RNA levels in your blood (you are "RNA-positive").  An internal fetal monitor is used during labor.  You have abnormal liver function test results.  You have cirrhosis of the liver. The risk of infection to your baby is not increased if:  You have a prolonged labor.  You breastfeed your baby. The risk of your child having the disease is the same whether your baby is born by vaginal delivery or cesarean delivery. If you do not have HIV, you can breastfeed your baby with hepatitis C. HOME CARE INSTRUCTIONS  Keep regular appointments with your health care provider so your liver function can be checked.  Make sure you understand what medicines are safe for you to take during pregnancy.  Get plenty of rest and sleep.  Eat a healthy and balanced diet.  Protect your abdomen from injury (trauma). Trauma could rupture a swollen, enlarged liver.  Only take medicines as directed by your health care provider.  Check with your health care provider before taking any new medicines including over-the-counter medicines. You may need a different dosage or may need to avoid taking certain medicines if liver damage is possible.  Take your vitamins and supplements as directed by your health care provider. SEEK MEDICAL CARE IF:  You develop nausea, tiredness, and loss of appetite.  Your urine is dark and your stool is light colored or gray.  You develop pain in your upper abdomen. SEEK IMMEDIATE  MEDICAL CARE IF:  Your skin and the whites of your eyes turn yellow.  You have a fever or persistent symptoms for more than 2-3 days.  You have a fever and your symptoms suddenly get worse.  You develop abdominal pain.  You develop bruising or bleeding problems.  You develop a severe headache. This information is not intended to replace advice given to you by your health care provider. Make sure you discuss any questions you have with your health care provider. Document Released: 07/23/2009 Document Revised: 12/07/2015 Document Reviewed: 01/03/2016 Elsevier Interactive Patient Education  2017 Elsevier Inc. Human Papillomavirus Human papillomavirus (HPV) is the most common sexually transmitted infection (STI). It is easy to pass it from person to person (contagious). HPV can cause cervical cancer, anal cancer, and genital warts. The genital warts can be seen and felt. Also, there may be wartlike regions in the throat. HPV may not have any symptoms. It is possible to have HPV for a long time and not know it. You may pass HPV on to others without knowing it. Follow these instructions at home:  Take medicines as told by your doctor.  Use over-the-counter creams for itching as told by your doctor.  Keep all follow-up visits. Make sure to get Pap tests as told by your doctor.  Do not touch or scratch the warts.  Do not treat genital warts with medicines used for treating hand warts.  Do not have sex while you are getting treatment.  Do not douche or use tampons during  treatment of HPV.  Tell your sex partner about your infection because he or she may also need treatment.  If you get pregnant, tell your doctor that you had HPV. Your doctor will watch your pregnancy closely. This is important to keep your baby safe.  After treatment, use condoms during sex to prevent future infections.  Have only one sex partner.  Have a sex partner who does not have other sex partners. Contact a  doctor if:  The treated skin is red, swollen, or painful.  You have a fever.  You feel ill.  You feel lumps or pimple-like areas in and around your genital area.  You have bleeding of the vagina or the area that was treated.  You have pain during sex. This information is not intended to replace advice given to you by your health care provider. Make sure you discuss any questions you have with your health care provider. Document Released: 06/13/2008 Document Revised: 12/07/2015 Document Reviewed: 10/06/2013 Elsevier Interactive Patient Education  2017 Elsevier Inc. Preventing Cervical Cancer Cervical cancer is cancer that grows on the cervix. The cervix is at the bottom of the uterus. It connects the uterus to the vagina. The uterus is where a baby develops during pregnancy. Cancer occurs when cells become abnormal and start to grow out of control. Cervical cancer grows slowly and may not cause any symptoms at first. Over time, the cancer can grow deep into the cervix tissue and spread to other areas. If it is found early, cervical cancer can be treated effectively. You can also take steps to prevent this type of cancer. Most cases of cervical cancer are caused by an STI (sexually transmitted infection) called human papillomavirus (HPV). One way to reduce your risk of cervical cancer is to avoid infection with the HPV virus. You can do this by practicing safe sex and by getting the HPV vaccine. Getting regular Pap tests is also important because this can help identify changes in cells that could lead to cancer. Your chances of getting this disease can also be reduced by making certain lifestyle changes. How can I protect myself from cervical cancer? Preventing HPV infection   Ask your health care provider about getting the HPV vaccine. If you are 35 years old or younger, you may need to get this vaccine, which is given in three doses over 6 months. This vaccine protects against the types of  HPV that could cause cancer.  Limit the number of people you have sex with. Also avoid having sex with people who have had many sex partners.  Use a latex condom during sex. Getting Pap tests   Get Pap tests regularly, starting at age 18. Talk with your health care provider about how often you need these tests.  Most women who are 43?37 years of age should have a Pap test every 3 years.  Most women who are 65?37 years of age should have a Pap test in combination with an HPV test every 5 years.  Women with a higher risk of cervical cancer, such as those with a weakened immune system or those who have been exposed to the drug diethylstilbestrol (DES), may need more frequent testing. Making other lifestyle changes   Do not use any products that contain nicotine or tobacco, such as cigarettes and e-cigarettes. If you need help quitting, ask your health care provider.  Eat at least 5 servings of fruits and vegetables every day.  Lose weight if you are overweight. Why are these  changes important?  These changes and screening tests are designed to address the factors that are known to increase the risk of cervical cancer. Taking these steps is the best way to reduce your risk.  Having regular Pap tests will help identify changes in cells that could lead to cancer. Steps can then be taken to prevent cancer from developing.  These changes will also help find cervical cancer early. This type of cancer can be treated effectively if it is found early. It can be more dangerous and difficult to treat if cancer has grown deep into your cervix or has spread.  In addition to making you less likely to get cervical cancer, these changes will also provide other health benefits, such as the following:  Practicing safe sex is important for preventing STIs and unplanned pregnancies.  Avoiding tobacco can reduce your risk for other cancers and health issues.  Eating a healthy diet and maintaining a  healthy weight are good for your overall health. What can happen if changes are not made? In the early stages, cervical cancer might not have any symptoms. It can take many years for the cancer to grow and get deep into the cervix tissue. This may be happening without you knowing about it. If you develop any symptoms, such as pelvic pain or unusual discharge or bleeding from your vagina, you should see your health care provider right away. If cervical cancer is not found early, you might need treatments such as radiation, chemotherapy, or surgery. In some cases, surgery may mean that you will not be able to get pregnant or carry a pregnancy to term. Where to find support: Talk with your health care provider, school nurse, or local health department for guidance about screening and vaccination. Some children and teens may be able to get the HPV vaccine free of charge through the [place] government's Vaccines for Children [facility]) program. Other places that provide vaccinations include:  Public health clinics. Check with your local health department.  Federally [employer], where you would pay only what you can afford. To find one near you, check this website: http://weiss.info/  [facility]. These are part of a program for Medicare and Medicaid patients who live in rural areas. The National Breast and Cervical Cancer Early Detection Program also provides breast and cervical cancer screenings and diagnostic services to low-income, uninsured, and underinsured women. Cervical cancer can be passed down through families. Talk with your health care provider or genetic counselor to learn more about genetic testing for cancer. Where to find more information: Learn more about cervical cancer from:  [employer]: HardChecks.be  American Cancer Society: www.cancer.org/cancer/cervicalcancer/  [place]. Centers for Disease Control and  Prevention: MedicationWarning.tn Summary  Talk with your health care provider about getting the HPV vaccine.  Be sure to get regular Pap tests as recommended by your health care provider.  See your health care provider right away if you have any pelvic pain or unusual discharge or bleeding from your vagina. This information is not intended to replace advice given to you by your health care provider. Make sure you discuss any questions you have with your health care provider. Document Released: 07/16/2015 Document Revised: 02/27/2016 Document Reviewed: 02/27/2016 Elsevier Interactive Patient Education  2017 [employer].

## 2016-10-30 NOTE — Progress Notes (Signed)
      Subjective:  [name] is a 36 y.o. [id number] at 39w2dbeing seen today for ongoing prenatal care.  She is currently monitored for the following issues for this high-risk pregnancy and has Heroin dependence ([place]; Methadone maintenance treatment complicating pregnancy (HS[place]; Supervision of high-risk pregnancy; Hepatitis C, chronic, maternal, antepartum (HH[place]; AMA (advanced maternal age) multigravida 35+; Rubella non-immune status, antepartum; and Low grade squamous intraepithelial lesion on cytologic smear of cervix (LGSIL) on her problem list.  Patient reports no complaints.  Contractions: Not present. Vag. Bleeding: None.  Movement: Absent. Denies leaking of fluid.   The following portions of the patient's history were reviewed and updated as appropriate: allergies, current medications, past family history, past medical history, past social history, past surgical history and problem list. Problem list updated.  Objective:   Vitals:   10/30/16 1246  BP: 113/60  Pulse: 70  Weight: 181 lb 1.6 oz (82.1 kg)    Fetal Status: Fetal Heart Rate (bpm): 145   Movement: Absent      General:  Alert, oriented and cooperative. Patient is in no acute distress.  Skin: Skin is warm and dry. No rash noted.   Cardiovascular: Normal heart rate noted  Respiratory: Normal respiratory effort, no problems with respiration noted  Abdomen: Soft, gravid, appropriate for gestational age. Pain/Pressure: Absent     Pelvic:  Cervical exam deferred   Cervix appeared visually closed during colposcopy.   Extremities: Normal range of motion.  Edema: None  Mental Status: Normal mood and affect. Normal behavior. Normal judgment and thought content.   Urinalysis:      Assessment and Plan:  Pregnancy: [id number] 145w2d1. Supervision of high risk pregnancy in first trimester - Decided she does NOT want QUAD screen, would not change her or FOB mind on pregnancy - Anatomy scan already scheduled  2. Low  grade squamous intraepithelial lesion on cytologic smear of cervix (LGSIL) - Colpo today, repeat pap in 1 year  3. Hepatitis C, chronic, maternal, antepartum ([place]- To see ID after pregnancy completion - Reviewed viral load with patient  4. Rubella non-immune status, antepartum - MMR postpartum  5. Methadone maintenance treatment affecting pregnancy in first trimester (HCF[place] 6. Elderly multigravida in first trimester   Preterm labor symptoms and general obstetric precautions including but not limited to vaginal bleeding, contractions, leaking of fluid and fetal movement were reviewed in detail with the patient. Please refer to After Visit Summary for other counseling recommendations.  Return in about 4 weeks (around 11/27/2016) for Routine OB visit; Needs anatomy scan set up at next visit.   ElKatherine BassetDO OB Fellow Center for [facility]

## 2016-10-30 NOTE — Progress Notes (Signed)
GYNECOLOGY CLINIC COLPOSCOPY VISIT AND PROCEDURE NOTE  36 y.o. [name] here for colposcopy for low-grade squamous intraepithelial neoplasia (LGSIL - encompassing HPV,mild dysplasia,CIN I) pap smear on 10/02/16. Prior cervical cytology and/or colposcopy findings: were abnormal but does not recall what it was, about 7 years ago with her previous pregnancy, never followed up due to no insurance. The patient reports the following prior treatments to the vulva/vagina/cervix: None. The patient is not a cigarette smoker. The patient is not immunosuppressed. The patient IS pregnant. The patient is not taking anticoagulants and is not allergic to iodine.  The risks (including infection, bleeding, pain) and benefits of the procedure were explained to the patient and written informed consent was obtained. Patient given informed consent, signed copy in the chart, time out was performed. Urine pregnancy test performed and confirmed to be negative.  Placed in lithotomy position.    Gross findings:   Vagina: Normal mucosa  Vulva: Normal external genitalia  Cervix: Closed, pink, Nabothian cysts x3 noted at 3 o'clock  Visualization after:  Acetic acid: Acetowhite border, no mosaicism, no punctation, no abnormal vessels  Lugol's solution: pale area noted from 11-1o'clock  Green or blue filter: N/A  Upper one-third of vagina examined, revealing: Normal mucosa  Biopsies obtained: None  ECC specimen obtained: N/A - currently pregnant  Colposcopy adequate? No  No specimen's were collected due to pregnancy and findings did not warrant biopsy today. Recommend repeating pap smear after pregnancy in about 1 year, and likely may need repeat colposcopy.    Patient was given post procedure instructions.  Will follow up pathology and manage accordingly.      Jen Mow, DO OB/GYN Fellow Center for [employer] [facility])

## 2016-11-13 ENCOUNTER — Telehealth: Payer: Self-pay | Admitting: *Deleted

## 2016-11-13 DIAGNOSIS — O219 Vomiting of pregnancy, unspecified: Secondary | ICD-10-CM

## 2016-11-13 MED ORDER — ONDANSETRON HCL 4 MG PO TABS: 4.0000 mg | ORAL_TABLET | Freq: Three times a day (TID) | ORAL | 2 refills | Status: DC | PRN

## 2016-11-13 NOTE — Telephone Encounter (Signed)
Pt called and stated that she is having severe nausea and some vomiting.  She has been out of Zofran x2 days and requests a refill she stated that the pharmacy has reached out to the doctor and no response. Pt was advised that I will speak with a doctor and most likely, the refill will be approved. She should check with her pharmacy later today.  Pt voiced understanding.  Refill authorized by Dr. Shawnie Pons. Rx e-prescribed.

## 2016-11-14 ENCOUNTER — Encounter: Payer: Self-pay | Admitting: General Practice

## 2016-12-04 ENCOUNTER — Ambulatory Visit (HOSPITAL_COMMUNITY): Payer: Medicaid Other

## 2016-12-04 ENCOUNTER — Ambulatory Visit (HOSPITAL_COMMUNITY)
Admission: RE | Admit: 2016-12-04 | Discharge: 2016-12-04 | Disposition: A | Discharge status: Home / Self Care | Payer: Medicaid Other | Source: Ambulatory Visit | Attending: Obstetrics & Gynecology | Admitting: Obstetrics & Gynecology

## 2016-12-04 ENCOUNTER — Ambulatory Visit (INDEPENDENT_AMBULATORY_CARE_PROVIDER_SITE_OTHER): Payer: Medicaid Other | Admitting: Obstetrics and Gynecology

## 2016-12-04 ENCOUNTER — Other Ambulatory Visit (HOSPITAL_COMMUNITY): Payer: Self-pay | Admitting: Maternal and Fetal Medicine

## 2016-12-04 ENCOUNTER — Encounter (HOSPITAL_COMMUNITY): Payer: Self-pay

## 2016-12-04 VITALS — BP 118/66 | HR 68 | Wt 185.0 lb

## 2016-12-04 DIAGNOSIS — B182 Chronic viral hepatitis C: Secondary | ICD-10-CM

## 2016-12-04 DIAGNOSIS — O99322 Drug use complicating pregnancy, second trimester: Secondary | ICD-10-CM

## 2016-12-04 DIAGNOSIS — O98419 Viral hepatitis complicating pregnancy, unspecified trimester: Secondary | ICD-10-CM | POA: Insufficient documentation

## 2016-12-04 DIAGNOSIS — O99320 Drug use complicating pregnancy, unspecified trimester: Secondary | ICD-10-CM | POA: Diagnosis not present

## 2016-12-04 DIAGNOSIS — O0992 Supervision of high risk pregnancy, unspecified, second trimester: Secondary | ICD-10-CM

## 2016-12-04 DIAGNOSIS — F1110 Opioid abuse, uncomplicated: Secondary | ICD-10-CM | POA: Insufficient documentation

## 2016-12-04 DIAGNOSIS — O9989 Other specified diseases and conditions complicating pregnancy, childbirth and the puerperium: Secondary | ICD-10-CM

## 2016-12-04 DIAGNOSIS — R87612 Low grade squamous intraepithelial lesion on cytologic smear of cervix (LGSIL): Secondary | ICD-10-CM | POA: Diagnosis not present

## 2016-12-04 DIAGNOSIS — Z3A19 19 weeks gestation of pregnancy: Secondary | ICD-10-CM | POA: Diagnosis not present

## 2016-12-04 DIAGNOSIS — Z283 Underimmunization status: Secondary | ICD-10-CM

## 2016-12-04 DIAGNOSIS — F1120 Opioid dependence, uncomplicated: Secondary | ICD-10-CM

## 2016-12-04 DIAGNOSIS — Z3689 Encounter for other specified antenatal screening: Secondary | ICD-10-CM | POA: Insufficient documentation

## 2016-12-04 DIAGNOSIS — O09522 Supervision of elderly multigravida, second trimester: Secondary | ICD-10-CM | POA: Insufficient documentation

## 2016-12-04 DIAGNOSIS — Z2839 Other underimmunization status: Secondary | ICD-10-CM

## 2016-12-04 DIAGNOSIS — O09529 Supervision of elderly multigravida, unspecified trimester: Secondary | ICD-10-CM

## 2016-12-04 NOTE — Progress Notes (Signed)
   PRENATAL VISIT NOTE  Subjective:  [name] is a 36 y.o. [id number] at 5755w2d being seen today for ongoing prenatal care.  She is currently monitored for the following issues for this high-risk pregnancy and has Heroin dependence (HCC); Methadone maintenance treatment complicating pregnancy (HCC); Supervision of high-risk pregnancy; Hepatitis C, chronic, maternal, antepartum (HCC); AMA (advanced maternal age) multigravida 35+; Rubella non-immune status, antepartum; and Low grade squamous intraepithelial lesion on cytologic smear of cervix (LGSIL) on her problem list.  Patient reports no complaints.  Contractions: Not present. Vag. Bleeding: None.   . Denies leaking of fluid.   The following portions of the patient's history were reviewed and updated as appropriate: allergies, current medications, past family history, past medical history, past social history, past surgical history and problem list. Problem list updated.  Objective:   Vitals:   12/04/16 1615  BP: 118/66  Pulse: 68  Weight: 185 lb (83.9 kg)    Fetal Status: Fetal Heart Rate (bpm): 140         General:  Alert, oriented and cooperative. Patient is in no acute distress.  Skin: Skin is warm and dry. No rash noted.   Cardiovascular: Normal heart rate noted  Respiratory: Normal respiratory effort, no problems with respiration noted  Abdomen: Soft, gravid, appropriate for gestational age. Pain/Pressure: Absent     Pelvic:  Cervical exam deferred        Extremities: Normal range of motion.  Edema: None  Mental Status: Normal mood and affect. Normal behavior. Normal judgment and thought content.   Assessment and Plan:  Pregnancy: [id number] at 6855w2d  1. Elderly multigravida in second trimester Patient declined quad screen  2. Supervision of high risk pregnancy in second trimester Patient is doing well without complaints Anatomy ultrasound performed today- report not yet available  3. Hepatitis C, chronic, maternal,  antepartum (HCC) Follow up with ID pp  4. Rubella non-immune status, antepartum Will offer pp  5. Methadone maintenance treatment affecting pregnancy in second trimester [facility](HCC) Patient increased dose to 70 mg last week NICU contacted to plan a tour and discuss expectations  6. Low grade squamous intraepithelial lesion on cytologic smear of cervix (LGSIL) Plan for repeat pap smear in 1 year. Patient had neg colpo on 4/18  General obstetric precautions including but not limited to vaginal bleeding, contractions, leaking of fluid and fetal movement were reviewed in detail with the patient. Please refer to After Visit Summary for other counseling recommendations.  Return in about 4 weeks (around 01/01/2017) for ROB.   Catalina AntiguaPeggy Ranay Ketter, MD

## 2016-12-05 ENCOUNTER — Other Ambulatory Visit (HOSPITAL_COMMUNITY): Payer: Self-pay | Admitting: *Deleted

## 2016-12-05 DIAGNOSIS — F1120 Opioid dependence, uncomplicated: Secondary | ICD-10-CM

## 2016-12-05 DIAGNOSIS — O99322 Drug use complicating pregnancy, second trimester: Principal | ICD-10-CM

## 2016-12-10 ENCOUNTER — Telehealth: Payer: Self-pay | Admitting: General Practice

## 2016-12-10 NOTE — Telephone Encounter (Signed)
Patient called and left message stating she is [redacted] weeks pregnant and she woke up to her breast leaking and isn't sure if this is normal because that didn't happen with her last 2 babies. Called patient and discussed that was a normal sign of pregnancy. Patient verbalized understanding & had no questions

## 2016-12-12 ENCOUNTER — Encounter (HOSPITAL_COMMUNITY): Payer: Self-pay

## 2016-12-12 NOTE — Progress Notes (Signed)
NICU NAS Tour scheduled for 01/01/17 around 2 p.m. ([facility]). Mom aware. Questions answered.

## 2016-12-13 ENCOUNTER — Other Ambulatory Visit: Payer: Self-pay

## 2017-01-01 ENCOUNTER — Ambulatory Visit (INDEPENDENT_AMBULATORY_CARE_PROVIDER_SITE_OTHER): Payer: Medicaid Other | Admitting: Obstetrics and Gynecology

## 2017-01-01 ENCOUNTER — Ambulatory Visit (HOSPITAL_COMMUNITY)
Admission: RE | Admit: 2017-01-01 | Discharge: 2017-01-01 | Disposition: A | Discharge status: Home / Self Care | Payer: Medicaid Other | Source: Ambulatory Visit | Attending: Obstetrics and Gynecology | Admitting: Obstetrics and Gynecology

## 2017-01-01 ENCOUNTER — Encounter (HOSPITAL_COMMUNITY): Payer: Self-pay

## 2017-01-01 VITALS — BP 113/65 | HR 90 | Wt 190.8 lb

## 2017-01-01 DIAGNOSIS — O98419 Viral hepatitis complicating pregnancy, unspecified trimester: Secondary | ICD-10-CM | POA: Diagnosis not present

## 2017-01-01 DIAGNOSIS — O9989 Other specified diseases and conditions complicating pregnancy, childbirth and the puerperium: Secondary | ICD-10-CM

## 2017-01-01 DIAGNOSIS — O99320 Drug use complicating pregnancy, unspecified trimester: Secondary | ICD-10-CM | POA: Diagnosis present

## 2017-01-01 DIAGNOSIS — F1120 Opioid dependence, uncomplicated: Secondary | ICD-10-CM | POA: Diagnosis not present

## 2017-01-01 DIAGNOSIS — Z283 Underimmunization status: Secondary | ICD-10-CM

## 2017-01-01 DIAGNOSIS — Z3A23 23 weeks gestation of pregnancy: Secondary | ICD-10-CM | POA: Insufficient documentation

## 2017-01-01 DIAGNOSIS — J0110 Acute frontal sinusitis, unspecified: Secondary | ICD-10-CM

## 2017-01-01 DIAGNOSIS — O99322 Drug use complicating pregnancy, second trimester: Secondary | ICD-10-CM | POA: Diagnosis not present

## 2017-01-01 DIAGNOSIS — O09522 Supervision of elderly multigravida, second trimester: Secondary | ICD-10-CM

## 2017-01-01 DIAGNOSIS — O0992 Supervision of high risk pregnancy, unspecified, second trimester: Secondary | ICD-10-CM

## 2017-01-01 DIAGNOSIS — F1110 Opioid abuse, uncomplicated: Secondary | ICD-10-CM | POA: Insufficient documentation

## 2017-01-01 DIAGNOSIS — B182 Chronic viral hepatitis C: Secondary | ICD-10-CM | POA: Diagnosis not present

## 2017-01-01 DIAGNOSIS — Z362 Encounter for other antenatal screening follow-up: Secondary | ICD-10-CM | POA: Diagnosis not present

## 2017-01-01 DIAGNOSIS — Z2839 Other underimmunization status: Secondary | ICD-10-CM

## 2017-01-01 DIAGNOSIS — O98412 Viral hepatitis complicating pregnancy, second trimester: Secondary | ICD-10-CM | POA: Diagnosis not present

## 2017-01-01 MED ORDER — BUTALBITAL-APAP-CAFFEINE 50-325-40 MG PO CAPS: ORAL_CAPSULE | ORAL | 1 refills | Status: DC

## 2017-01-01 MED ORDER — AMOXICILLIN 500 MG PO CAPS: 500.0000 mg | ORAL_CAPSULE | Freq: Two times a day (BID) | ORAL | 0 refills | Status: AC

## 2017-01-01 NOTE — Progress Notes (Signed)
Prenatal Visit Note Date: 01/01/2017 [facility]  Subjective:  [name] is a 36 y.o. [id number] at 2685w2d being seen today for ongoing prenatal care.  She is currently monitored for the following issues for this high-risk pregnancy and has Heroin dependence (HCC); Methadone maintenance treatment complicating pregnancy (HCC); Supervision of high-risk pregnancy; Hepatitis C, chronic, maternal, antepartum (HCC); AMA (advanced maternal age) multigravida 35+; Rubella non-immune status, antepartum; and Low grade squamous intraepithelial lesion on cytologic smear of cervix (LGSIL) on her problem list.  Patient reports still has migraines about 2-3x/week that's helped with Goodys powder. 3wk h/o sinus pressure, cough with occasional yellow/green phlegm. No fevers, chills chest pain, sob  Contractions: Not present. Vag. Bleeding: None.  Movement: Present. Denies leaking of fluid.   The following portions of the patient's history were reviewed and updated as appropriate: allergies, current medications, past family history, past medical history, past social history, past surgical history and problem list. Problem list updated.  Objective:   Vitals:   01/01/17 1304  BP: 113/65  Pulse: 90  Weight: 190 lb 12.8 oz (86.5 kg)    Fetal Status: Fetal Heart Rate (bpm): 142   Movement: Present     General:  Alert, oriented and cooperative. Patient is in no acute distress.  Skin: Skin is warm and dry. No rash noted.   Cardiovascular: Normal heart rate noted  Respiratory: Normal respiratory effort, no problems with respiration noted  Abdomen: Soft, gravid, appropriate for gestational age. Pain/Pressure: Absent     Pelvic:  Cervical exam deferred        Extremities: Normal range of motion.  Edema: None  Mental Status: Normal mood and affect. Normal behavior. Normal judgment and thought content.   HEENT: slight frontal sinus tenderness. No occipital, maxillary tenderness or  with movement of ear  Urinalysis:      Assessment and Plan:  Pregnancy: [id number] at 7685w2d  1. Methadone maintenance treatment affecting pregnancy in second trimester (HCC) On 73qday. [facility]. Getting surveillance growth with mfm. Next on later today  2. Elderly multigravida in second trimester No issues  3. Hepatitis C, chronic, maternal, antepartum (HCC) LFTs, coags, fibrinogen with 28wk labs. GI referral PP  4. Supervision of high risk pregnancy in second trimester D/w pt re: BC nv.   5. Sinusitis Patient with HA and using goody's powder. D/w pt to do the amox for the infection and see if that helps. Only uses goodys 2-3x/week. D/w her to see if abx help and if not then can try fioricet 2-3x/week to see if that helps and safer given the amount of asa in the goodys  Preterm labor symptoms and general obstetric precautions including but not limited to vaginal bleeding, contractions, leaking of fluid and fetal movement were reviewed in detail with the patient. Please refer to After Visit Summary for other counseling recommendations.  Return in about 2 weeks (around 01/15/2017) for 2-3wk rob with 2hr GTT.   [place] BingPickens, Krysti Hickling, MD

## 2017-01-01 NOTE — Progress Notes (Signed)
NICU NAS Tour completed by Valentina ShaggyFairy Coleman, NNP.

## 2017-01-02 ENCOUNTER — Encounter: Payer: Self-pay | Admitting: Family Medicine

## 2017-01-02 ENCOUNTER — Other Ambulatory Visit (HOSPITAL_COMMUNITY): Payer: Self-pay | Admitting: *Deleted

## 2017-01-02 DIAGNOSIS — O99323 Drug use complicating pregnancy, third trimester: Principal | ICD-10-CM

## 2017-01-02 DIAGNOSIS — F1120 Opioid dependence, uncomplicated: Secondary | ICD-10-CM

## 2017-01-04 ENCOUNTER — Inpatient Hospital Stay (HOSPITAL_COMMUNITY)
Admission: AD | Admit: 2017-01-04 | Discharge: 2017-01-04 | Disposition: A | Discharge status: Home / Self Care | Payer: Medicaid Other | Source: Ambulatory Visit | Attending: Obstetrics and Gynecology | Admitting: Obstetrics and Gynecology

## 2017-01-04 ENCOUNTER — Encounter (HOSPITAL_COMMUNITY): Payer: Self-pay | Admitting: *Deleted

## 2017-01-04 DIAGNOSIS — F1120 Opioid dependence, uncomplicated: Secondary | ICD-10-CM

## 2017-01-04 DIAGNOSIS — O98812 Other maternal infectious and parasitic diseases complicating pregnancy, second trimester: Secondary | ICD-10-CM | POA: Insufficient documentation

## 2017-01-04 DIAGNOSIS — B3731 Acute candidiasis of vulva and vagina: Secondary | ICD-10-CM

## 2017-01-04 DIAGNOSIS — N898 Other specified noninflammatory disorders of vagina: Secondary | ICD-10-CM | POA: Diagnosis present

## 2017-01-04 DIAGNOSIS — O99322 Drug use complicating pregnancy, second trimester: Secondary | ICD-10-CM

## 2017-01-04 DIAGNOSIS — O0992 Supervision of high risk pregnancy, unspecified, second trimester: Secondary | ICD-10-CM

## 2017-01-04 DIAGNOSIS — B373 Candidiasis of vulva and vagina: Secondary | ICD-10-CM

## 2017-01-04 DIAGNOSIS — Z3A23 23 weeks gestation of pregnancy: Secondary | ICD-10-CM | POA: Diagnosis not present

## 2017-01-04 DIAGNOSIS — O09522 Supervision of elderly multigravida, second trimester: Secondary | ICD-10-CM

## 2017-01-04 DIAGNOSIS — O98419 Viral hepatitis complicating pregnancy, unspecified trimester: Secondary | ICD-10-CM

## 2017-01-04 DIAGNOSIS — Z283 Underimmunization status: Secondary | ICD-10-CM

## 2017-01-04 DIAGNOSIS — Z2839 Other underimmunization status: Secondary | ICD-10-CM

## 2017-01-04 DIAGNOSIS — B182 Chronic viral hepatitis C: Secondary | ICD-10-CM

## 2017-01-04 DIAGNOSIS — O26892 Other specified pregnancy related conditions, second trimester: Secondary | ICD-10-CM | POA: Diagnosis present

## 2017-01-04 DIAGNOSIS — O9989 Other specified diseases and conditions complicating pregnancy, childbirth and the puerperium: Secondary | ICD-10-CM

## 2017-01-04 LAB — WET PREP, GENITAL
CLUE CELLS WET PREP: NONE SEEN
Sperm: NONE SEEN
TRICH WET PREP: NONE SEEN
Yeast Wet Prep HPF POC: NONE SEEN

## 2017-01-04 LAB — URINALYSIS, ROUTINE W REFLEX MICROSCOPIC
Bilirubin Urine: NEGATIVE
GLUCOSE, UA: NEGATIVE mg/dL
Hgb urine dipstick: NEGATIVE
KETONES UR: NEGATIVE mg/dL
Nitrite: NEGATIVE
PH: 6 (ref 5.0–8.0)
Protein, ur: 30 mg/dL — AB
SPECIFIC GRAVITY, URINE: 1.03 (ref 1.005–1.030)

## 2017-01-04 MED ORDER — TERCONAZOLE 0.4 % VA CREA: 1.0000 | TOPICAL_CREAM | Freq: Every day | VAGINAL | 0 refills | Status: DC

## 2017-01-04 NOTE — MAU Note (Signed)
Been having real weird pains in vagina. Sharp little needle sticks. Worse when stands. abd is tender.  Noted brown sticky d/c today.

## 2017-01-04 NOTE — MAU Note (Signed)
PT SAYS  SHE STARTED SPOTTING  AT 6 PM  AND  STARTED HAVING  SHARP PAIN  YESTERDAY-   PAIN IS STILL THE SAME.  LAST SEX WAS 1 WEEK AGO.

## 2017-01-04 NOTE — Discharge Instructions (Signed)
Pick up medication prescribed to your pharmacy to use vaginally for yeast infection. Keep all of your appointments in the office. Return if you develop fever, worsening symptoms, vaginal bleeding or leaking of fluid.

## 2017-01-04 NOTE — MAU Provider Note (Signed)
History     CSN: [id number]  Arrival date and time: 01/04/17 1842   First Provider Initiated Contact with Patient 01/04/17 1928      Chief Complaint  Patient presents with  . Vaginal Discharge  . Vaginal Pain  . Abdominal Pain   HPI MI BALLA 36 y.o. [redacted]w[redacted]d  Comes to MAU today as she has had what feels like pin pricks in the vagina all day.  Then today she felt something coming out and saw some brown fluid on her underwear that she thought was blood.  Was worried and came in for evaluation.  Client has currently been taking Amoxicillin for a sinus infection.   OB History    Gravida Para Term Preterm AB Living   6 2 2  0 3 2   SAB TAB Ectopic Multiple Live Births   1 2     2       Past Medical History:  Diagnosis Date  . 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 Hepatitis C    2014  . Herniated disc   . 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 Shoulder dislocation, recurrent     Past Surgical History:  Procedure Laterality Date  . NO PAST SURGERIES      Family History  Problem Relation Age of Onset  . COPD Mother   . Drug abuse Mother   . Alcohol abuse Mother   . Hypertension Mother   . Arthritis Father   . Diabetes Father   . Alcohol abuse Father   . Drug abuse Father   . Hypertension Father     Social History  Substance Use Topics  . Smoking status: Former Games developer  . Smokeless tobacco: Never Used  . Alcohol use No    Allergies: No Known Allergies  Prescriptions Prior to Admission  Medication Sig Dispense Refill Last Dose  . amoxicillin (AMOXIL) 500 MG capsule Take 1 capsule (500 mg total) by mouth 2 (two) times daily. 20 capsule 0 01/04/2017 at Unknown time  . methadone (DOLOPHINE) 10 MG/ML solution Take 50 mg by mouth daily.   01/04/2017 at Unknown time  . Prenatal Multivit-Min-Fe-FA (PRENATAL VITAMINS) 0.8 MG tablet Take 1 tablet by mouth daily. 30 tablet 12 01/04/2017 at Unknown time  . RaNITidine HCl (ZANTAC PO) Take  by mouth.   01/04/2017 at Unknown time  . acetaminophen (TYLENOL) 325 MG tablet Take 650 mg by mouth every 6 (six) hours as needed.   Taking  . Aspirin-Acetaminophen-Caffeine (GOODY HEADACHE PO) Take by mouth.   Taking  . Butalbital-APAP-Caffeine 50-325-40 MG capsule 1-2 tabs po 2-3x/week at the most (Patient not taking: Reported on 01/01/2017) 15 capsule 1 Not Taking  . Docusate Sodium (COLACE PO) Take by mouth.   Taking  . Doxylamine Succinate, Sleep, (UNISOM PO) Take by mouth.   Not Taking  . glycerin adult 2 g suppository Place 1 suppository rectally as needed for constipation.   Not Taking  . ondansetron (ZOFRAN) 4 MG tablet Take 1 tablet (4 mg total) by mouth every 8 (eight) hours as needed for nausea or vomiting. 30 tablet 2 Taking  . Pyridoxine HCl (VITAMIN B6 PO) Take by mouth.   Not Taking    Review of Systems  Constitutional: Negative for fever.  Gastrointestinal: Negative for nausea and vomiting.       Mild diffuse lower abdominal pain - no leaking  Genitourinary: Positive for vaginal discharge and vaginal pain. Negative for dysuria.   Physical Exam  Blood pressure 125/66, pulse 73, temperature 98.1 F (36.7 C), temperature source Oral, resp. rate 18, weight 190 lb 12 oz (86.5 kg), last menstrual period 07/22/2016, SpO2 98 %.  Physical Exam  Nursing note and vitals reviewed. Constitutional: She is oriented to person, place, and time. She appears well-developed and well-nourished.  HENT:  Head: Normocephalic.  Eyes: EOM are normal.  Neck: Neck supple.  GI: Soft.  Diffuse lower abdominal tenderness FHT baseline is 145 with moderate variability.  No contractions.  Baby is moving well.  Reassuring monitor strip.  Genitourinary:  Genitourinary Comments: Speculum exam: Vagina - Small amount of white discharge - grainy in texture and adherent to cervix, no blood or trace of blood seen - two drops of brown dried discharge seen in underwear - does not appear to be blood Cervix -  No contact bleeding, one 1mm pale yellow exophytic lesion - possibly a very small HSV at 2 o'clock Bimanual exam: Cervix closed, and thick and firm GC/Chlam, wet prep done Chaperone present for exam.   Musculoskeletal: Normal range of motion.  Neurological: She is alert and oriented to person, place, and time.  Skin: Skin is warm and dry.  Psychiatric: She has a normal mood and affect.    MAU Course  Procedures Results for orders placed or performed during the hospital encounter of 01/04/17 (from the past 24 hour(s))  Urinalysis, Routine w reflex microscopic     Status: Abnormal   Collection Time: 01/04/17  7:03 PM  Result Value Ref Range   Color, Urine YELLOW YELLOW   APPearance HAZY (A) CLEAR   Specific Gravity, Urine 1.030 1.005 - 1.030   pH 6.0 5.0 - 8.0   Glucose, UA NEGATIVE NEGATIVE mg/dL   Hgb urine dipstick NEGATIVE NEGATIVE   Bilirubin Urine NEGATIVE NEGATIVE   Ketones, ur NEGATIVE NEGATIVE mg/dL   Protein, ur 30 (A) NEGATIVE mg/dL   Nitrite NEGATIVE NEGATIVE   Leukocytes, UA LARGE (A) NEGATIVE   RBC / HPF 0-5 0 - 5 RBC/hpf   WBC, UA TOO NUMEROUS TO COUNT 0 - 5 WBC/hpf   Bacteria, UA RARE (A) NONE SEEN   Squamous Epithelial / LPF 0-5 (A) NONE SEEN   Mucous PRESENT   Wet prep, genital     Status: Abnormal   Collection Time: 01/04/17  7:37 PM  Result Value Ref Range   Yeast Wet Prep HPF POC NONE SEEN NONE SEEN   Trich, Wet Prep NONE SEEN NONE SEEN   Clue Cells Wet Prep HPF POC NONE SEEN NONE SEEN   WBC, Wet Prep HPF POC MODERATE (A) NONE SEEN   Sperm NONE SEEN     MDM With moderate WBC and the appearance of the vaginal discharge, will treat for yeast infection which may be causing the sensation she is having in the vagina.   Consult with Dr. Alysia PennaErvin and reviewed the plan of care.  Assessment and Plan  Yeast infection in pregnancy at 23 weeks No blood seen on speculum exam  Plan Will eprescribe medication to use vaginally for yeast infection. Keep all of your  appointments in the office. Return if you develop fever, worsening symptoms, vaginal bleeding or leaking of fluid. Currie Pariserri L Kutter Schnepf 01/04/2017, 7:39 PM

## 2017-01-06 LAB — GC/CHLAMYDIA PROBE AMP (~~LOC~~) NOT AT ARMC
CHLAMYDIA, DNA PROBE: NEGATIVE
Neisseria Gonorrhea: NEGATIVE

## 2017-01-09 ENCOUNTER — Other Ambulatory Visit: Payer: Self-pay | Admitting: Obstetrics and Gynecology

## 2017-01-09 DIAGNOSIS — O219 Vomiting of pregnancy, unspecified: Secondary | ICD-10-CM

## 2017-01-09 NOTE — Telephone Encounter (Signed)
Patient is requesting to speak to a nurse.

## 2017-01-22 ENCOUNTER — Other Ambulatory Visit: Payer: Self-pay | Admitting: Family Medicine

## 2017-01-22 DIAGNOSIS — O219 Vomiting of pregnancy, unspecified: Secondary | ICD-10-CM

## 2017-01-22 MED ORDER — ONDANSETRON HCL 4 MG PO TABS: 4.0000 mg | ORAL_TABLET | Freq: Three times a day (TID) | ORAL | 3 refills | Status: DC | PRN

## 2017-01-22 MED ORDER — DOXYLAMINE-PYRIDOXINE 10-10 MG PO TBEC: DELAYED_RELEASE_TABLET | ORAL | 6 refills | Status: DC

## 2017-01-22 NOTE — Telephone Encounter (Signed)
Patient called into front office requesting refill on zofran. Refills sent in to pharmacy per Dr Macon LargeAnyanwu. Patient states zofran sometimes makes her constipated. Offered diclegis prescription to patient. Patient requests. Med ordered & prior auth form sent. Patient had no other questions

## 2017-01-29 ENCOUNTER — Ambulatory Visit (INDEPENDENT_AMBULATORY_CARE_PROVIDER_SITE_OTHER): Payer: Medicaid Other | Admitting: Obstetrics and Gynecology

## 2017-01-29 VITALS — BP 105/64 | HR 73 | Wt 194.0 lb

## 2017-01-29 DIAGNOSIS — O99322 Drug use complicating pregnancy, second trimester: Secondary | ICD-10-CM | POA: Diagnosis not present

## 2017-01-29 DIAGNOSIS — O98419 Viral hepatitis complicating pregnancy, unspecified trimester: Secondary | ICD-10-CM | POA: Diagnosis not present

## 2017-01-29 DIAGNOSIS — B182 Chronic viral hepatitis C: Secondary | ICD-10-CM | POA: Diagnosis not present

## 2017-01-29 DIAGNOSIS — O09522 Supervision of elderly multigravida, second trimester: Secondary | ICD-10-CM | POA: Diagnosis not present

## 2017-01-29 DIAGNOSIS — F1120 Opioid dependence, uncomplicated: Secondary | ICD-10-CM

## 2017-01-29 DIAGNOSIS — O0992 Supervision of high risk pregnancy, unspecified, second trimester: Secondary | ICD-10-CM | POA: Diagnosis present

## 2017-01-29 DIAGNOSIS — Z23 Encounter for immunization: Secondary | ICD-10-CM

## 2017-01-29 DIAGNOSIS — K219 Gastro-esophageal reflux disease without esophagitis: Secondary | ICD-10-CM | POA: Diagnosis not present

## 2017-01-29 MED ORDER — OMEPRAZOLE 10 MG PO CPDR: 10.0000 mg | DELAYED_RELEASE_CAPSULE | Freq: Every day | ORAL | 1 refills | Status: DC

## 2017-01-29 MED ORDER — TETANUS-DIPHTH-ACELL PERTUSSIS 5-2.5-18.5 LF-MCG/0.5 IM SUSP
0.5000 mL | Freq: Once | INTRAMUSCULAR | Status: AC
  Administered 2017-01-29: 0.5 mL via INTRAMUSCULAR

## 2017-01-29 NOTE — Progress Notes (Signed)
Subjective:  [name] is a 36 y.o. O2P1898 at 30w2dbeing seen today for ongoing prenatal care.  She is currently monitored for the following issues for this high-risk pregnancy and has Heroin dependence (HW[place]; Methadone maintenance treatment complicating pregnancy (HAc[place]; Supervision of high-risk pregnancy; Hepatitis C, chronic, maternal, antepartum ([place]; AMA (advanced maternal age) multigravida 35+; Rubella non-immune status, antepartum; Low grade squamous intraepithelial lesion on cytologic smear of cervix (LGSIL); and GERD (gastroesophageal reflux disease) on her problem list.  Patient reports heartburn.  Contractions: Not present. Vag. Bleeding: None.  Movement: Present. Denies leaking of fluid.   The following portions of the patient's history were reviewed and updated as appropriate: allergies, current medications, past family history, past medical history, past social history, past surgical history and problem list. Problem list updated.  Objective:   Vitals:   01/29/17 0920  BP: 105/64  Pulse: 73  Weight: 194 lb (88 kg)    Fetal Status: Fetal Heart Rate (bpm): 141   Movement: Present     General:  Alert, oriented and cooperative. Patient is in no acute distress.  Skin: Skin is warm and dry. No rash noted.   Cardiovascular: Normal heart rate noted  Respiratory: Normal respiratory effort, no problems with respiration noted  Abdomen: Soft, gravid, appropriate for gestational age. Pain/Pressure: Absent     Pelvic:  Cervical exam deferred        Extremities: Normal range of motion.  Edema: None  Mental Status: Normal mood and affect. Normal behavior. Normal judgment and thought content.   Urinalysis:      Assessment and Plan:  Pregnancy: [id number] 23w2d1. Supervision of high risk pregnancy in second trimester  - Glucose Tolerance, 2 Hours w/1 Hour - RPR - CBC - HIV antibody (with reflex)  2. Hepatitis C, chronic, maternal, antepartum (HCC) GI referral PP  -  Comp Met (CMET) - Fibrinogen - INR/PT - PTT  3. Methadone maintenance treatment affecting pregnancy in second trimester ([place]St[place] [facility]  4. Elderly multigravida in second trimester Stable  5. Gastroesophageal reflux disease without esophagitis Prilosec  Preterm labor symptoms and general obstetric precautions including but not limited to vaginal bleeding, contractions, leaking of fluid and fetal movement were reviewed in detail with the patient. Please refer to After Visit Summary for other counseling recommendations.  Return in about 2 weeks (around 02/12/2017) for OB visit.   ErChancy MilroyMD

## 2017-01-29 NOTE — Progress Notes (Signed)
Diclegis prior authorization faxed.

## 2017-01-30 LAB — COMPREHENSIVE METABOLIC PANEL
A/G RATIO: 1.5 (ref 1.2–2.2)
ALBUMIN: 3.7 g/dL (ref 3.5–5.5)
ALK PHOS: 46 IU/L (ref 39–117)
ALT: 26 IU/L (ref 0–32)
AST: 49 IU/L — ABNORMAL HIGH (ref 0–40)
BILIRUBIN TOTAL: 0.2 mg/dL (ref 0.0–1.2)
BUN / CREAT RATIO: 16 (ref 9–23)
BUN: 7 mg/dL (ref 6–20)
CHLORIDE: 104 mmol/L (ref 96–106)
CO2: 18 mmol/L — ABNORMAL LOW (ref 20–29)
Calcium: 8.3 mg/dL — ABNORMAL LOW (ref 8.7–10.2)
Creatinine, Ser: 0.44 mg/dL — ABNORMAL LOW (ref 0.57–1.00)
GFR calc Af Amer: 150 mL/min/{1.73_m2} (ref >59)
GFR calc non Af Amer: 130 mL/min/{1.73_m2} (ref >59)
GLUCOSE: 74 mg/dL (ref 65–99)
Globulin, Total: 2.4 g/dL (ref 1.5–4.5)
POTASSIUM: 4.6 mmol/L (ref 3.5–5.2)
Sodium: 135 mmol/L (ref 134–144)
Total Protein: 6.1 g/dL (ref 6.0–8.5)

## 2017-01-30 LAB — PROTIME-INR
INR: 0.9 (ref 0.8–1.2)
Prothrombin Time: 9.8 s (ref 9.1–12.0)

## 2017-01-30 LAB — CBC
Hematocrit: 32 % — ABNORMAL LOW (ref 34.0–46.6)
Hemoglobin: 10.8 g/dL — ABNORMAL LOW (ref 11.1–15.9)
MCH: 31.8 pg (ref 26.6–33.0)
MCHC: 33.8 g/dL (ref 31.5–35.7)
MCV: 94 fL (ref 79–97)
Platelets: 206 10*3/uL (ref 150–379)
RBC: 3.4 x10E6/uL — AB (ref 3.77–5.28)
RDW: 14 % (ref 12.3–15.4)
WBC: 4.1 10*3/uL (ref 3.4–10.8)

## 2017-01-30 LAB — GLUCOSE TOLERANCE, 2 HOURS W/ 1HR
GLUCOSE, 2 HOUR: 65 mg/dL (ref 65–152)
Glucose, 1 hour: 89 mg/dL (ref 65–179)
Glucose, Fasting: 73 mg/dL (ref 65–91)

## 2017-01-30 LAB — FIBRINOGEN: FIBRINOGEN: 273 mg/dL (ref 193–507)

## 2017-01-30 LAB — HIV ANTIBODY (ROUTINE TESTING W REFLEX): HIV Screen 4th Generation wRfx: NONREACTIVE

## 2017-01-30 LAB — RPR: RPR: NONREACTIVE

## 2017-01-30 LAB — APTT: APTT: 26 s (ref 24–33)

## 2017-02-12 ENCOUNTER — Encounter (HOSPITAL_COMMUNITY): Payer: Self-pay

## 2017-02-12 ENCOUNTER — Ambulatory Visit (HOSPITAL_COMMUNITY)
Admission: RE | Admit: 2017-02-12 | Discharge: 2017-02-12 | Disposition: A | Discharge status: Home / Self Care | Payer: Medicaid Other | Source: Ambulatory Visit | Attending: Obstetrics and Gynecology | Admitting: Obstetrics and Gynecology

## 2017-02-12 DIAGNOSIS — Z283 Underimmunization status: Secondary | ICD-10-CM

## 2017-02-12 DIAGNOSIS — O0992 Supervision of high risk pregnancy, unspecified, second trimester: Secondary | ICD-10-CM

## 2017-02-12 DIAGNOSIS — Z362 Encounter for other antenatal screening follow-up: Secondary | ICD-10-CM | POA: Insufficient documentation

## 2017-02-12 DIAGNOSIS — O09522 Supervision of elderly multigravida, second trimester: Secondary | ICD-10-CM | POA: Diagnosis not present

## 2017-02-12 DIAGNOSIS — O99323 Drug use complicating pregnancy, third trimester: Secondary | ICD-10-CM | POA: Diagnosis present

## 2017-02-12 DIAGNOSIS — Z3A29 29 weeks gestation of pregnancy: Secondary | ICD-10-CM | POA: Insufficient documentation

## 2017-02-12 DIAGNOSIS — O98419 Viral hepatitis complicating pregnancy, unspecified trimester: Secondary | ICD-10-CM | POA: Diagnosis not present

## 2017-02-12 DIAGNOSIS — F1120 Opioid dependence, uncomplicated: Secondary | ICD-10-CM | POA: Insufficient documentation

## 2017-02-12 DIAGNOSIS — O9989 Other specified diseases and conditions complicating pregnancy, childbirth and the puerperium: Secondary | ICD-10-CM

## 2017-02-12 DIAGNOSIS — B182 Chronic viral hepatitis C: Secondary | ICD-10-CM | POA: Diagnosis not present

## 2017-02-12 DIAGNOSIS — O09899 Supervision of other high risk pregnancies, unspecified trimester: Secondary | ICD-10-CM

## 2017-02-12 DIAGNOSIS — O99322 Drug use complicating pregnancy, second trimester: Secondary | ICD-10-CM

## 2017-02-13 ENCOUNTER — Other Ambulatory Visit (HOSPITAL_COMMUNITY): Payer: Self-pay | Admitting: *Deleted

## 2017-02-13 DIAGNOSIS — F1190 Opioid use, unspecified, uncomplicated: Secondary | ICD-10-CM

## 2017-02-13 DIAGNOSIS — F1120 Opioid dependence, uncomplicated: Secondary | ICD-10-CM

## 2017-02-19 ENCOUNTER — Encounter: Payer: Self-pay | Admitting: Obstetrics & Gynecology

## 2017-02-20 ENCOUNTER — Other Ambulatory Visit: Payer: Self-pay | Admitting: General Practice

## 2017-02-20 DIAGNOSIS — R51 Headache: Principal | ICD-10-CM

## 2017-02-20 DIAGNOSIS — O26893 Other specified pregnancy related conditions, third trimester: Secondary | ICD-10-CM

## 2017-02-20 MED ORDER — BUTALBITAL-APAP-CAFFEINE 50-325-40 MG PO TABS: 1.0000 | ORAL_TABLET | Freq: Four times a day (QID) | ORAL | 0 refills | Status: DC | PRN

## 2017-02-26 ENCOUNTER — Telehealth: Payer: Self-pay | Admitting: *Deleted

## 2017-02-26 MED ORDER — PANTOPRAZOLE SODIUM 20 MG PO TBEC: 20.0000 mg | DELAYED_RELEASE_TABLET | Freq: Every day | ORAL | 2 refills | Status: DC

## 2017-02-26 NOTE — Telephone Encounter (Signed)
Patient called c/o bad acid reflux and vomiting, was taking zantac 150mg  bid but this has stopped working. Dr Alysia PennaErvin put her on prilosec but she stated this has never worked for her. Requests rx for protonix, which is what worked during her last pregnancy. Consulted with Dr Vergie LivingPickens who okayed rx for protonix 20mg  daily. Prescription to pharmacy.

## 2017-02-28 ENCOUNTER — Encounter: Payer: Self-pay | Admitting: Obstetrics and Gynecology

## 2017-03-10 ENCOUNTER — Ambulatory Visit (INDEPENDENT_AMBULATORY_CARE_PROVIDER_SITE_OTHER): Payer: Medicaid Other | Admitting: Obstetrics and Gynecology

## 2017-03-10 VITALS — BP 117/71 | HR 74 | Wt 195.0 lb

## 2017-03-10 DIAGNOSIS — O98419 Viral hepatitis complicating pregnancy, unspecified trimester: Secondary | ICD-10-CM

## 2017-03-10 DIAGNOSIS — B182 Chronic viral hepatitis C: Secondary | ICD-10-CM

## 2017-03-10 DIAGNOSIS — O99323 Drug use complicating pregnancy, third trimester: Secondary | ICD-10-CM

## 2017-03-10 DIAGNOSIS — O09899 Supervision of other high risk pregnancies, unspecified trimester: Secondary | ICD-10-CM

## 2017-03-10 DIAGNOSIS — O09523 Supervision of elderly multigravida, third trimester: Secondary | ICD-10-CM

## 2017-03-10 DIAGNOSIS — O0993 Supervision of high risk pregnancy, unspecified, third trimester: Secondary | ICD-10-CM

## 2017-03-10 DIAGNOSIS — Z283 Underimmunization status: Secondary | ICD-10-CM

## 2017-03-10 DIAGNOSIS — O9989 Other specified diseases and conditions complicating pregnancy, childbirth and the puerperium: Secondary | ICD-10-CM

## 2017-03-10 DIAGNOSIS — F1120 Opioid dependence, uncomplicated: Secondary | ICD-10-CM

## 2017-03-10 NOTE — Progress Notes (Signed)
   PRENATAL VISIT NOTE  Subjective:  [name] is a 36 y.o. [id number] at [redacted]w[redacted]d being seen today for ongoing prenatal care.  She is currently monitored for the following issues for this high-risk pregnancy and has Heroin dependence (HCC); Methadone maintenance treatment complicating pregnancy (HCC); Supervision of high-risk pregnancy; Hepatitis C, chronic, maternal, antepartum (HCC); AMA (advanced maternal age) multigravida 35+; Rubella non-immune status, antepartum; Low grade squamous intraepithelial lesion on cytologic smear of cervix (LGSIL); and GERD (gastroesophageal reflux disease) on her problem list.  Patient reports no complaints.  Contractions: Not present. Vag. Bleeding: None.  Movement: Present. Denies leaking of fluid.   The following portions of the patient's history were reviewed and updated as appropriate: allergies, current medications, past family history, past medical history, past social history, past surgical history and problem list. Problem list updated.  Objective:   Vitals:   03/10/17 1305  BP: 117/71  Pulse: 74  Weight: 195 lb (88.5 kg)    Fetal Status: Fetal Heart Rate (bpm): 143   Movement: Present     General:  Alert, oriented and cooperative. Patient is in no acute distress.  Skin: Skin is warm and dry. No rash noted.   Cardiovascular: Normal heart rate noted  Respiratory: Normal respiratory effort, no problems with respiration noted  Abdomen: Soft, gravid, appropriate for gestational age.  Pain/Pressure: Present     Pelvic: Cervical exam deferred        Extremities: Normal range of motion.  Edema: None  Mental Status:  Normal mood and affect. Normal behavior. Normal judgment and thought content.   Assessment and Plan:  Pregnancy: [name] at [redacted]w[redacted]d  1. Elderly multigravida in third trimester Declined testing  2. Supervision of high risk pregnancy in third trimester Patient is doing well Reviewed glucola results  3. Rubella non-immune status,  antepartum Will offer pp  4. Hepatitis C, chronic, maternal, antepartum (HCC) GI pp  5. Methadone maintenance treatment affecting pregnancy in third trimester [facility]) Currently on 96mg  daily Completed NICU tour Follow up growth ultrasound on 9/12  Preterm labor symptoms and general obstetric precautions including but not limited to vaginal bleeding, contractions, leaking of fluid and fetal movement were reviewed in detail with the patient. Please refer to After Visit Summary for other counseling recommendations.  Return in about 2 weeks (around 03/24/2017) for ROB.   Karen Antigua, MD

## 2017-03-24 ENCOUNTER — Ambulatory Visit (HOSPITAL_COMMUNITY)
Admission: RE | Admit: 2017-03-24 | Discharge: 2017-03-24 | Disposition: A | Discharge status: Home / Self Care | Payer: Medicaid Other | Source: Ambulatory Visit | Attending: Maternal and Fetal Medicine | Admitting: Maternal and Fetal Medicine

## 2017-03-24 ENCOUNTER — Ambulatory Visit (INDEPENDENT_AMBULATORY_CARE_PROVIDER_SITE_OTHER): Payer: Medicaid Other | Admitting: Obstetrics and Gynecology

## 2017-03-24 ENCOUNTER — Encounter (HOSPITAL_COMMUNITY): Payer: Self-pay

## 2017-03-24 ENCOUNTER — Other Ambulatory Visit: Payer: Self-pay | Admitting: Obstetrics & Gynecology

## 2017-03-24 VITALS — BP 111/63 | HR 68 | Wt 197.0 lb

## 2017-03-24 DIAGNOSIS — B182 Chronic viral hepatitis C: Secondary | ICD-10-CM

## 2017-03-24 DIAGNOSIS — F1120 Opioid dependence, uncomplicated: Secondary | ICD-10-CM

## 2017-03-24 DIAGNOSIS — G43009 Migraine without aura, not intractable, without status migrainosus: Secondary | ICD-10-CM | POA: Insufficient documentation

## 2017-03-24 DIAGNOSIS — Z283 Underimmunization status: Secondary | ICD-10-CM

## 2017-03-24 DIAGNOSIS — O98419 Viral hepatitis complicating pregnancy, unspecified trimester: Secondary | ICD-10-CM | POA: Diagnosis present

## 2017-03-24 DIAGNOSIS — O26893 Other specified pregnancy related conditions, third trimester: Secondary | ICD-10-CM

## 2017-03-24 DIAGNOSIS — O99323 Drug use complicating pregnancy, third trimester: Secondary | ICD-10-CM

## 2017-03-24 DIAGNOSIS — R51 Headache: Principal | ICD-10-CM

## 2017-03-24 DIAGNOSIS — O0993 Supervision of high risk pregnancy, unspecified, third trimester: Secondary | ICD-10-CM

## 2017-03-24 DIAGNOSIS — R519 Headache, unspecified: Secondary | ICD-10-CM

## 2017-03-24 DIAGNOSIS — Z3A35 35 weeks gestation of pregnancy: Secondary | ICD-10-CM | POA: Insufficient documentation

## 2017-03-24 DIAGNOSIS — O98413 Viral hepatitis complicating pregnancy, third trimester: Secondary | ICD-10-CM

## 2017-03-24 DIAGNOSIS — O09899 Supervision of other high risk pregnancies, unspecified trimester: Secondary | ICD-10-CM

## 2017-03-24 DIAGNOSIS — O09523 Supervision of elderly multigravida, third trimester: Secondary | ICD-10-CM

## 2017-03-24 DIAGNOSIS — O9989 Other specified diseases and conditions complicating pregnancy, childbirth and the puerperium: Secondary | ICD-10-CM

## 2017-03-24 DIAGNOSIS — F1190 Opioid use, unspecified, uncomplicated: Secondary | ICD-10-CM

## 2017-03-24 DIAGNOSIS — O99320 Drug use complicating pregnancy, unspecified trimester: Secondary | ICD-10-CM | POA: Insufficient documentation

## 2017-03-24 MED ORDER — CYCLOBENZAPRINE HCL 10 MG PO TABS: 10.0000 mg | ORAL_TABLET | Freq: Three times a day (TID) | ORAL | 1 refills | Status: DC | PRN

## 2017-03-24 NOTE — Progress Notes (Signed)
Subjective:  [name] is a 36 y.o. [id number] at 5640w0d being seen today for ongoing prenatal care.  She is currently monitored for the following issues for this high-risk pregnancy and has Heroin dependence (HCC); Methadone maintenance treatment complicating pregnancy (HCC); Supervision of high-risk pregnancy; Hepatitis C, chronic, maternal, antepartum (HCC); AMA (advanced maternal age) multigravida 35+; Rubella non-immune status, antepartum; Low grade squamous intraepithelial lesion on cytologic smear of cervix (LGSIL); and GERD (gastroesophageal reflux disease) on her problem list.  Patient reports headache.  Contractions: Not present. Vag. Bleeding: None.  Movement: Present. Denies leaking of fluid.   The following portions of the patient's history were reviewed and updated as appropriate: allergies, current medications, past family history, past medical history, past social history, past surgical history and problem list. Problem list updated.  Objective:   Vitals:   03/24/17 1442  BP: 111/63  Pulse: 68  Weight: 89.4 kg (197 lb)    Fetal Status: Fetal Heart Rate (bpm): 137   Movement: Present     General:  Alert, oriented and cooperative. Patient is in no acute distress.  Skin: Skin is warm and dry. No rash noted.   Cardiovascular: Normal heart rate noted  Respiratory: Normal respiratory effort, no problems with respiration noted  Abdomen: Soft, gravid, appropriate for gestational age. Pain/Pressure: Absent     Pelvic:  Cervical exam deferred        Extremities: Normal range of motion.  Edema: None  Mental Status: Normal mood and affect. Normal behavior. Normal judgment and thought content.   Urinalysis:      Assessment and Plan:  Pregnancy: [id number] at 10440w0d  1. Elderly multigravida in third trimester Declined testing  2. Supervision of high risk pregnancy in third trimester Stable H/O migraine Ha, requesting more Fioricet. Pt informed unable to prescribe anymore  Will  try Flexeril and refer to Nada MaclachlanKaren Clark-Teague  Decline flu vaccine GBS and cultures next visit Growth scan today 3. Methadone maintenance treatment affecting pregnancy in third trimester (HCC) Stable  4. Hepatitis C, chronic, maternal, antepartum (HCC) Stable GI PP  Preterm labor symptoms and general obstetric precautions including but not limited to vaginal bleeding, contractions, leaking of fluid and fetal movement were reviewed in detail with the patient. Please refer to After Visit Summary for other counseling recommendations.  Return in about 1 week (around 03/31/2017) for OB visit.   Hermina StaggersErvin, Aloha Bartok L, MD

## 2017-03-24 NOTE — Addendum Note (Signed)
Addended by: Hermina StaggersERVIN, Sussie Minor L on: 03/24/2017 03:15 PM   Modules accepted: Orders

## 2017-03-24 NOTE — Patient Instructions (Signed)
AREA PEDIATRIC/FAMILY PRACTICE PHYSICIANS  [facility] [address], [address] [place], [place]  [address] Phone - [phone]   Fax - [phone]  [facility] [address] [place], [place] [address] Phone - [phone]   Fax - [phone]  [facility] [address] [place], [place]  [address] Phone - [phone]   Fax - [phone]  [facility] [address] [place], [place]  [address] Phone - [phone]   Fax - [phone]  [facility] [address] [place], [place]  [address] Phone - [phone]   Fax - [phone]  [facility] [address] [place], [place]  [address] Phone - [phone]   Fax - [phone]  [facility] [place] [address] [place], [place]  [address] Phone - [phone]   Fax - [phone]  [facility] [address] [place], [place]  [address] Phone - [phone]   Fax - [phone]  [facility] [address] [place], [place]  [address] Phone - [phone]   Fax - [phone] [facility] [address] [place], [place]  [address] Phone - [phone]   Fax - [phone]  [facility] [address] [place], [place]  [address] Phone - [phone]   Fax - [phone]  [facility] [address] [place], [place]  [address] Phone - [phone]   Fax - [phone]  [facility] [address] [place], [place]  [address] Phone - [phone]   Fax - [phone]  [facility]ILPA GOSRANI [address] [place], [place]  [address] Phone - [phone]  [facility] [address] [place], [place]  [address] Phone - [phone]   Fax - [phone]  [facility] [address] [place], [place]  [address] Phone - [phone]   Fax - [phone]  [facility] [address] [place], [place]  [address] Phone - [phone]   Fax - [phone]  [facility] [address] [place], [place]  [address] Phone - [phone]   Fax - [phone]   [facility] [address] [place], [place]  [address] Phone - [phone]   Fax - [phone]  [facility] [address] [place], [place]  [address] Phone - [phone]   Fax - [phone]  [facility]ID RUBIN [address] [place], [place]  [address] Phone - [phone]   Fax - [phone]  [facility] [address] , [place]  [address] Phone - [phone]   Fax - [phone]  [facility] [address] , [place]  [address] Phone - [phone]   Fax - [phone] [facility] [address] [place], [place]  [address] Phone - [phone]   Fax - [phone]  [facility] [address] [place], [place]  [address] Phone - [phone]   Fax - [phone]   [facility] [place] [address] [place], [place]  [address] Phone - [phone]   Fax - [phone]  [facility] Adeline Charlene Flemming MD [address]  [place] [address] Phone [phone]  Fax [phone]   

## 2017-03-26 ENCOUNTER — Ambulatory Visit (HOSPITAL_COMMUNITY): Payer: Medicaid Other

## 2017-04-01 ENCOUNTER — Ambulatory Visit (INDEPENDENT_AMBULATORY_CARE_PROVIDER_SITE_OTHER): Payer: Medicaid Other | Admitting: Obstetrics & Gynecology

## 2017-04-01 ENCOUNTER — Other Ambulatory Visit (HOSPITAL_COMMUNITY)
Admission: RE | Admit: 2017-04-01 | Discharge: 2017-04-01 | Disposition: A | Discharge status: Home / Self Care | Payer: Medicaid Other | Source: Ambulatory Visit | Attending: Obstetrics & Gynecology | Admitting: Obstetrics & Gynecology

## 2017-04-01 VITALS — BP 126/80 | HR 80 | Wt 203.0 lb

## 2017-04-01 DIAGNOSIS — O0993 Supervision of high risk pregnancy, unspecified, third trimester: Secondary | ICD-10-CM | POA: Insufficient documentation

## 2017-04-01 DIAGNOSIS — O321XX0 Maternal care for breech presentation, not applicable or unspecified: Secondary | ICD-10-CM | POA: Insufficient documentation

## 2017-04-01 DIAGNOSIS — O99323 Drug use complicating pregnancy, third trimester: Secondary | ICD-10-CM | POA: Diagnosis present

## 2017-04-01 DIAGNOSIS — F1120 Opioid dependence, uncomplicated: Secondary | ICD-10-CM | POA: Diagnosis not present

## 2017-04-01 DIAGNOSIS — R51 Headache: Secondary | ICD-10-CM | POA: Diagnosis not present

## 2017-04-01 DIAGNOSIS — R399 Unspecified symptoms and signs involving the genitourinary system: Secondary | ICD-10-CM | POA: Diagnosis not present

## 2017-04-01 DIAGNOSIS — O26893 Other specified pregnancy related conditions, third trimester: Secondary | ICD-10-CM

## 2017-04-01 DIAGNOSIS — Z113 Encounter for screening for infections with a predominantly sexual mode of transmission: Secondary | ICD-10-CM | POA: Diagnosis not present

## 2017-04-01 MED ORDER — BUTALBITAL-APAP-CAFFEINE 50-325-40 MG PO TABS: 1.0000 | ORAL_TABLET | Freq: Four times a day (QID) | ORAL | 0 refills | Status: DC | PRN

## 2017-04-01 MED ORDER — CEPHALEXIN 500 MG PO CAPS: 500.0000 mg | ORAL_CAPSULE | Freq: Four times a day (QID) | ORAL | 2 refills | Status: DC

## 2017-04-01 NOTE — Progress Notes (Signed)
   PRENATAL VISIT NOTE  Subjective:  [name] is a 36 y.o. [id number] at [redacted]w[redacted]d being seen today for ongoing prenatal care.  She is currently monitored for the following issues for this high-risk pregnancy and has Heroin dependence (HCC); Methadone maintenance treatment complicating pregnancy (HCC); Supervision of high-risk pregnancy; Hepatitis C, chronic, maternal, antepartum (HCC); AMA (advanced maternal age) multigravida 35+; Rubella non-immune status, antepartum; Low grade squamous intraepithelial lesion on cytologic smear of cervix (LGSIL); GERD (gastroesophageal reflux disease); and Headache, common migraine on her problem list.  Patient reports and UTI symptoms  Contractions: Not present. Vag. Bleeding: None.  Movement: Present. Denies leaking of fluid.   The following portions of the patient's history were reviewed and updated as appropriate: allergies, current medications, past family history, past medical history, past social history, past surgical history and problem list. Problem list updated.  Objective:   Vitals:   04/01/17 1426  BP: 126/80  Pulse: 80  Weight: 203 lb (92.1 kg)    Fetal Status: Fetal Heart Rate (bpm): 140 Fundal Height: 36 cm Movement: Present  Presentation: Homero Fellers Breech  General:  Alert, oriented and cooperative. Patient is in no acute distress.  Skin: Skin is warm and dry. No rash noted.   Cardiovascular: Normal heart rate noted  Respiratory: Normal respiratory effort, no problems with respiration noted  Abdomen: Soft, gravid, appropriate for gestational age.  Pain/Pressure: Present     Pelvic: Cervical exam performed Dilation: Fingertip Effacement (%): Thick Station: Ballotable  Extremities: Normal range of motion.  Edema: Trace  Mental Status:  Normal mood and affect. Normal behavior. Normal judgment and thought content.   Assessment and Plan:  Pregnancy: [id number] at [redacted]w[redacted]d  1. Methadone maintenance treatment affecting pregnancy in third trimester  (HCC) Continue Methadone  2. UTI symptoms Will presumptively treat, has frequent UTIs. - Culture, OB Urine - cephALEXin (KEFLEX) 500 MG capsule; Take 1 capsule (500 mg total) by mouth 4 (four) times daily.  Dispense: 28 capsule; Refill: 2  3. Headache in pregnancy, antepartum, third trimester Fioricet refill given - butalbital-acetaminophen-caffeine (FIORICET, ESGIC) 50-325-40 MG tablet; Take 1 tablet by mouth every 6 (six) hours as needed for headache.  Dispense: 30 tablet; Refill: 0  4. Breech presentation, single or unspecified fetus Discussed ECV vs cesarean section; moxibustion also recommended. She will think about her options. Wants to avoid cesarean section. Recheck presentation next week  5. Supervision of high risk pregnancy in third trimester Pelvic cultures done today. - GC/Chlamydia probe amp ([place])not at [facility] - Strep Gp B NAA  Preterm labor symptoms and general obstetric precautions including but not limited to vaginal bleeding, contractions, leaking of fluid and fetal movement were reviewed in detail with the patient. Please refer to After Visit Summary for other counseling recommendations.  Return in about 1 week (around 04/08/2017) for OB Visit (HOB).   Jaynie Collins, MD

## 2017-04-01 NOTE — Patient Instructions (Addendum)
Return to clinic for any scheduled appointments or obstetric concerns, or go to MAU for evaluation  [facility] [place] [address], [address] [place], [place]  [address] Phone - [phone]   Fax - [phone]  [facility] [address] [place], [place] [address] Phone - [phone]   Fax - [place] [address] [place], [place]  [address] Phone - [phone]   Fax - [phone]  [place] [address] [place], [place]  [address] Phone - [phone]   Fax - [phone]  [facility] [address] [place], [place]  [address] Phone - [phone]   Fax - [phone]  [facility] [address] [place], [place]  [address] Phone - [phone]   Fax - [place] [address] [place], [place]  [address] Phone - [phone]   Fax - [phone]  [place] [address] [place], [address] [place], [place]  [address] Phone - [phone]   Fax - [place] [address] [place], [place]  [address] Phone - [phone]   Fax - [phone] [facility] Erick [place]. [address] [place], [place]  [address] Phone - [phone]   Fax - [phone]  [facility] [address] [place], [place]  [address] Phone - [phone]   Fax - [phone]  [facility] [place], [place], [place]  [address] Phone - [phone]   Fax - [place] [address], [place], [place]  [address] Phone - [phone]   Fax - [phone]  [facility] [address], [place], [place]  [address] Phone - [place], [place]  [address] Phone - [phone]   Fax - [place] [address] [place], [place]  [address] Phone - [phone]   Fax - [phone]  [place] [address] [place], [place]  [address] Phone - [phone]   Fax - [place]. [place], [place]  [address] Phone - [phone]   Fax - [place], [place], [place]  [address] Phone - [phone]   Fax - [place] [address] [place], [place]  [address] Phone - [phone]   Fax - [phone]  [facility] RUB[facility] [address], [place], [place]  [address] Phone - [phone]   Fax - [place]. [address] [place], [place]  [address] Phone - [phone]   Fax - [phone]  [place] [address] [place], [place]  [address] Phone - [phone]   Fax - [phone] [facility]nal[facility] Natal [address] [place], [place]  [address] Phone - [phone]   Fax - [place] [address] [place],   [address] Phone - [phone]   Fax - [place] [address], [place],   [address] Phone - [phone]   Fax - [phone]  [place] MD [address] [place] [address] Phone [phone]  Fax  [phone]  Breech Birth What is a breech birth? A breech birth is when a baby is born with the buttocks or the feet first. Most babies are in a head down (vertex) position when they are born. There are three types of breech babies:  When the baby's buttocks are showing first in the birth canal (vagina) with the legs straight up and the feet at the baby's head (frank breech).  When  the baby's buttocks shows first with the legs bent at the knees and the feet down near the buttocks (complete breech).  When one or both of the baby's feet are down below the buttocks (footling breech).  What are the risks of a breech birth? Having a breech birth increases the risk to your baby. A breech birth may cause the following:  Umbilical cord prolapse. This is when the umbilical cord is in front of the baby before or during labor. This can cause the cord to become pinched or compressed. This can reduce the flow of blood and oxygen to the baby.  The baby getting stuck in the birth canal, which can cause injury or, rarely, death.  Injury to the nerves in the shoulder, arm, and hand (brachial plexus injury) when delivered.  Your baby being born too early (prematurely).  An increased need for a cesarean delivery.  What increases the risk of having a breech baby? It is not known what causes your baby to be breech. However, risk factors that may increase your chances of having a breech baby include the following:  The mother having had several babies already.  The mother having twins or more.  The mother having a baby with certain congenital disabilities.  The mother going into labor early.  The mother having problems with her uterus, such as a tumor.  The mother having placenta problems (placenta previa) or too much or not enough fluid surrounding the baby (amniotic fluid).  How do I know if my baby is breech? There are no symptoms for you to know that your baby is breech. When you are close to your due date, your health care provider can tell if your baby is breech by:  An abdominal or vaginal (pelvic) exam.  An ultrasound.  Your health care provider may also be able to tell that your baby is breech if your baby's heartbeat is heard above your belly button. What can be done if my baby is breech?  Your health care provider may try to turn the baby in your uterus. This is  a procedure called external cephalic version (ECV). This is done by your health care provider. He or she will place both hands on your abdomen and gently and slowly turn the baby around. It is important to know that ECV can increase your chances of suddenly going into labor. If an ECV is done, it is done toward the end of a healthy pregnancy. The baby may remain in this position or he or she may turn back to the breech position. You and your health care provider will discuss if an ECV is recommended for you and your baby. How will I delivery my baby if my baby is breech? You and your health care provider will discuss the best way to deliver your baby. If your baby is breech, it is less likely that a vaginal delivery will be recommended due to the risks. Some breech babies may be delivered safely without a cesarean, while in other cases health care providers will recommend a  cesarean delivery. This information is not intended to replace advice given to you by your health care provider. Make sure you discuss any questions you have with your health care provider. Document Released: 08/22/2006 Document Revised: 06/17/2016 Document Reviewed: 05/05/2014 Elsevier Interactive Patient Education  2017 [employer].

## 2017-04-02 ENCOUNTER — Encounter: Payer: Self-pay | Admitting: Obstetrics & Gynecology

## 2017-04-02 LAB — GC/CHLAMYDIA PROBE AMP (~~LOC~~) NOT AT ARMC
Chlamydia: NEGATIVE
Neisseria Gonorrhea: NEGATIVE

## 2017-04-03 LAB — STREP GP B NAA: STREP GROUP B AG: NEGATIVE

## 2017-04-04 LAB — CULTURE, OB URINE

## 2017-04-04 LAB — URINE CULTURE, OB REFLEX

## 2017-04-07 ENCOUNTER — Telehealth: Payer: Self-pay | Admitting: General Practice

## 2017-04-07 ENCOUNTER — Other Ambulatory Visit: Payer: Self-pay | Admitting: Obstetrics & Gynecology

## 2017-04-07 DIAGNOSIS — O219 Vomiting of pregnancy, unspecified: Secondary | ICD-10-CM

## 2017-04-07 MED ORDER — ONDANSETRON HCL 4 MG PO TABS: 4.0000 mg | ORAL_TABLET | Freq: Two times a day (BID) | ORAL | 0 refills | Status: DC

## 2017-04-07 NOTE — Telephone Encounter (Signed)
Patient called into front office and requested call back. Called patient and she is requesting urine culture results. Informed patient of UTI and need to start keflex prescribed. Patient verbalized understanding & states she needs a refill on zofran. Informed patient of refill per Dr Adrian Black[place]. Patient verbalized understanding and states every time she takes an antibiotic she gets a yeast infection. Patient reports still having some terazol left over from last time and wants to know if she should use that. Told patient she can go ahead and start using that with the antibiotics and recommended probiotic as well. Patient verbalized understanding & had no questions

## 2017-04-10 ENCOUNTER — Ambulatory Visit: Payer: Self-pay

## 2017-04-10 ENCOUNTER — Telehealth (HOSPITAL_COMMUNITY): Payer: Self-pay | Admitting: *Deleted

## 2017-04-10 ENCOUNTER — Telehealth: Payer: Self-pay | Admitting: General Practice

## 2017-04-10 ENCOUNTER — Ambulatory Visit (INDEPENDENT_AMBULATORY_CARE_PROVIDER_SITE_OTHER): Payer: Medicaid Other | Admitting: Obstetrics and Gynecology

## 2017-04-10 VITALS — BP 118/80 | HR 83 | Wt 201.2 lb

## 2017-04-10 DIAGNOSIS — F1120 Opioid dependence, uncomplicated: Secondary | ICD-10-CM

## 2017-04-10 DIAGNOSIS — O0993 Supervision of high risk pregnancy, unspecified, third trimester: Secondary | ICD-10-CM

## 2017-04-10 DIAGNOSIS — Z283 Underimmunization status: Secondary | ICD-10-CM

## 2017-04-10 DIAGNOSIS — O09523 Supervision of elderly multigravida, third trimester: Secondary | ICD-10-CM | POA: Diagnosis present

## 2017-04-10 DIAGNOSIS — O99323 Drug use complicating pregnancy, third trimester: Secondary | ICD-10-CM

## 2017-04-10 DIAGNOSIS — O9989 Other specified diseases and conditions complicating pregnancy, childbirth and the puerperium: Secondary | ICD-10-CM | POA: Diagnosis not present

## 2017-04-10 DIAGNOSIS — Z2839 Other underimmunization status: Secondary | ICD-10-CM

## 2017-04-10 NOTE — Telephone Encounter (Signed)
Preadmission screen  

## 2017-04-10 NOTE — Telephone Encounter (Signed)
Called and informed patient of ECV appt. Patient verbalized understanding & had no questions

## 2017-04-10 NOTE — Progress Notes (Signed)
Schedule ECV for 9/28@ 9am- will call patient

## 2017-04-10 NOTE — Progress Notes (Signed)
Pt informed that the ultrasound is considered a limited OB ultrasound and is not intended to be a complete ultrasound exam.  Patient also informed that the ultrasound is not being completed with the intent of assessing for fetal or placental anomalies or any pelvic abnormalities.  Explained that the purpose of today's ultrasound is to assess for  presentation.  Patient acknowledges the purpose of the exam and the limitations of the study.    

## 2017-04-10 NOTE — Patient Instructions (Signed)
Breech Birth What is a breech birth? A breech birth is when a baby is born with the buttocks or the feet first. Most babies are in a head down (vertex) position when they are born. There are three types of breech babies:  When the baby's buttocks are showing first in the birth canal (vagina) with the legs straight up and the feet at the baby's head (frank breech).  When the baby's buttocks shows first with the legs bent at the knees and the feet down near the buttocks (complete breech).  When one or both of the baby's feet are down below the buttocks (footling breech).  What are the risks of a breech birth? Having a breech birth increases the risk to your baby. A breech birth may cause the following:  Umbilical cord prolapse. This is when the umbilical cord is in front of the baby before or during labor. This can cause the cord to become pinched or compressed. This can reduce the flow of blood and oxygen to the baby.  The baby getting stuck in the birth canal, which can cause injury or, rarely, death.  Injury to the nerves in the shoulder, arm, and hand (brachial plexus injury) when delivered.  Your baby being born too early (prematurely).  An increased need for a cesarean delivery.  What increases the risk of having a breech baby? It is not known what causes your baby to be breech. However, risk factors that may increase your chances of having a breech baby include the following:  The mother having had several babies already.  The mother having twins or more.  The mother having a baby with certain congenital disabilities.  The mother going into labor early.  The mother having problems with her uterus, such as a tumor.  The mother having placenta problems (placenta previa) or too much or not enough fluid surrounding the baby (amniotic fluid).  How do I know if my baby is breech? There are no symptoms for you to know that your baby is breech. When you are close to your due date,  your health care provider can tell if your baby is breech by:  An abdominal or vaginal (pelvic) exam.  An ultrasound.  Your health care provider may also be able to tell that your baby is breech if your baby's heartbeat is heard above your belly button. What can be done if my baby is breech?  Your health care provider may try to turn the baby in your uterus. This is a procedure called external cephalic version (ECV). This is done by your health care provider. He or she will place both hands on your abdomen and gently and slowly turn the baby around. It is important to know that ECV can increase your chances of suddenly going into labor. If an ECV is done, it is done toward the end of a healthy pregnancy. The baby may remain in this position or he or she may turn back to the breech position. You and your health care provider will discuss if an ECV is recommended for you and your baby. How will I delivery my baby if my baby is breech? You and your health care provider will discuss the best way to deliver your baby. If your baby is breech, it is less likely that a vaginal delivery will be recommended due to the risks. Some breech babies may be delivered safely without a cesarean, while in other cases health care providers will recommend a cesarean delivery. This   information is not intended to replace advice given to you by your health care provider. Make sure you discuss any questions you have with your health care provider. Document Released: 08/22/2006 Document Revised: 06/17/2016 Document Reviewed: 05/05/2014 Elsevier Interactive Patient Education  2017 Elsevier Inc.  

## 2017-04-10 NOTE — Progress Notes (Signed)
Subjective:  [name] is a 36 y.o. [id number] at 76w3dbeing seen today for ongoing prenatal care.  She is currently monitored for the following issues for this high-risk pregnancy and has Heroin dependence ([place]; Methadone maintenance treatment complicating pregnancy (HBrid[place]; Supervision of high-risk pregnancy; Hepatitis C, chronic, maternal, antepartum (HE[place]; AMA (advanced maternal age) multigravida 35+; Rubella non-immune status, antepartum; Low grade squamous intraepithelial lesion on cytologic smear of cervix (LGSIL); GERD (gastroesophageal reflux disease); Headache, common migraine; and Breech presentation, no version on her problem list.  Patient reports no complaints.  Contractions: Not present. Vag. Bleeding: None.  Movement: Present. Denies leaking of fluid.   The following portions of the patient's history were reviewed and updated as appropriate: allergies, current medications, past family history, past medical history, past social history, past surgical history and problem list. Problem list updated.  Objective:   Vitals:   04/10/17 0958  BP: 118/80  Pulse: 83  Weight: 201 lb 3.2 oz (91.3 kg)    Fetal Status: Fetal Heart Rate (bpm): 141   Movement: Present     General:  Alert, oriented and cooperative. Patient is in no acute distress.  Skin: Skin is warm and dry. No rash noted.   Cardiovascular: Normal heart rate noted  Respiratory: Normal respiratory effort, no problems with respiration noted  Abdomen: Soft, gravid, appropriate for gestational age. Pain/Pressure: Present     Pelvic:  Cervical exam performed        Extremities: Normal range of motion.  Edema: Trace  Mental Status: Normal mood and affect. Normal behavior. Normal judgment and thought content.   Urinalysis:      Assessment and Plan:  Pregnancy: [id number] 376w3d1. Elderly multigravida in third trimester   2. Methadone maintenance treatment affecting pregnancy in third trimester (HCCleoneContinue with  Methadone  3. Rubella non-immune status, antepartum MMR vaccine postpartum  4. Supervision of high risk pregnancy in third trimester Stable Remains in frank breech. ECV reviewed. Pt desires. Will schedule. - [employer]; Future  Term labor symptoms and general obstetric precautions including but not limited to vaginal bleeding, contractions, leaking of fluid and fetal movement were reviewed in detail with the patient. Please refer to After Visit Summary for other counseling recommendations.  Return in about 1 week (around 04/17/2017) for OB visit.   ErChancy MilroyMD

## 2017-04-11 ENCOUNTER — Encounter (HOSPITAL_COMMUNITY): Payer: Self-pay

## 2017-04-11 ENCOUNTER — Observation Stay (HOSPITAL_COMMUNITY)
Admission: RE | Admit: 2017-04-11 | Discharge: 2017-04-11 | Disposition: A | Discharge status: Home / Self Care | Payer: Medicaid Other | Source: Ambulatory Visit | Attending: Obstetrics & Gynecology | Admitting: Obstetrics & Gynecology

## 2017-04-11 DIAGNOSIS — O321XX0 Maternal care for breech presentation, not applicable or unspecified: Principal | ICD-10-CM | POA: Diagnosis present

## 2017-04-11 DIAGNOSIS — O9989 Other specified diseases and conditions complicating pregnancy, childbirth and the puerperium: Secondary | ICD-10-CM

## 2017-04-11 DIAGNOSIS — O98419 Viral hepatitis complicating pregnancy, unspecified trimester: Secondary | ICD-10-CM

## 2017-04-11 DIAGNOSIS — Z3A37 37 weeks gestation of pregnancy: Secondary | ICD-10-CM | POA: Diagnosis not present

## 2017-04-11 DIAGNOSIS — Z79891 Long term (current) use of opiate analgesic: Secondary | ICD-10-CM | POA: Insufficient documentation

## 2017-04-11 DIAGNOSIS — Z79899 Other long term (current) drug therapy: Secondary | ICD-10-CM | POA: Diagnosis not present

## 2017-04-11 DIAGNOSIS — Z283 Underimmunization status: Secondary | ICD-10-CM

## 2017-04-11 DIAGNOSIS — F1120 Opioid dependence, uncomplicated: Secondary | ICD-10-CM

## 2017-04-11 DIAGNOSIS — O0993 Supervision of high risk pregnancy, unspecified, third trimester: Secondary | ICD-10-CM

## 2017-04-11 DIAGNOSIS — Z8619 Personal history of other infectious and parasitic diseases: Secondary | ICD-10-CM | POA: Insufficient documentation

## 2017-04-11 DIAGNOSIS — O09899 Supervision of other high risk pregnancies, unspecified trimester: Secondary | ICD-10-CM

## 2017-04-11 DIAGNOSIS — O99323 Drug use complicating pregnancy, third trimester: Secondary | ICD-10-CM

## 2017-04-11 DIAGNOSIS — B182 Chronic viral hepatitis C: Secondary | ICD-10-CM

## 2017-04-11 DIAGNOSIS — O09523 Supervision of elderly multigravida, third trimester: Secondary | ICD-10-CM

## 2017-04-11 MED ORDER — LACTATED RINGERS IV SOLN: INTRAVENOUS | Status: DC

## 2017-04-11 MED ORDER — TERBUTALINE SULFATE 1 MG/ML IJ SOLN
0.2500 mg | Freq: Once | INTRAMUSCULAR | Status: AC
  Administered 2017-04-11: 0.25 mg via SUBCUTANEOUS
  Filled 2017-04-11: qty 1

## 2017-04-11 MED ORDER — LACTATED RINGERS IV SOLN: 500.0000 mL | INTRAVENOUS | Status: DC | PRN

## 2017-04-11 NOTE — H&P (Signed)
[name] is a 36 y.o. female presenting for external cephalic version . OB History    Gravida Para Term Preterm AB Living   0 3 2   SAB TAB Ectopic Multiple Live Births   Past Medical History:  Diagnosis Date  . 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 Hepatitis C    2014  . Herniated disc   . 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 Shoulder dislocation, recurrent    Past Surgical History:  Procedure Laterality Date  . NO PAST SURGERIES     Family History: family history includes Alcohol abuse in her father and mother; Arthritis in her father; COPD in her mother; Diabetes in her father; Drug abuse in her father and mother; Hypertension in her father and mother. Social History:  reports that she has quit smoking. She has never used smokeless tobacco. She reports that she uses drugs, including Heroin, IV, and Other-see comments. She reports that she does not drink alcohol.     Review of Systems  Constitutional: Negative.   Respiratory: Negative.   Cardiovascular: Negative.   Gastrointestinal: Negative.    Maternal Medical History:  Reason for admission: Breech presentation  Fetal activity: Perceived fetal activity is normal.    Prenatal Complications - Diabetes: none.      Blood pressure 125/75, pulse 71, height  (1.778 m), weight 201 lb (91.2 kg), last menstrual period 07/22/2016. Maternal Exam:  Abdomen: Patient reports no abdominal tenderness. Fundal height is 35 cm.   Fetal presentation: breech  Introitus: not evaluated.   Cervix: not evaluated.   Physical Exam  Vitals reviewed. Constitutional: She appears well-developed. No distress.  HENT:  Head: Normocephalic.  Respiratory: Effort normal. No respiratory distress.  Musculoskeletal: Normal range of motion.  Skin: Skin is warm and dry.  Psychiatric: She has a normal mood and affect. Her behavior is normal.   breech presentation by bedside  US Prenatal labs: ABO, Rh: O/Positive/-- (03/21 1427) Antibody: Negative (03/21 1427) Rubella: <0.90 (03/21 1427) RPR: Non Reactive (07/18 0948)  HBsAg: Negative (03/21 1427)  HIV:    GBS: Negative (09/18 1501)   Assessment/Plan: Breech presentation, she consents to ECV. Risk of pain, fetal distress, ROM, labor , bleeding, emergency cesarean section were discussed and questions were answered, consent signed   Scheryl Darter 04/11/2017, 9:42 AM

## 2017-04-11 NOTE — Discharge Summary (Signed)
Physician Discharge Summary  Patient ID: [name] MRN: [id number] DOB/AGE: Jan 25, 1981 36 y.o.  Admit date: 04/11/2017 Discharge date: 04/11/2017  Admission Diagnoses:[redacted]w[redacted]d Breech presentation  Discharge Diagnoses:  Active Problems:   Breech presentation, no version   Breech presentation   Discharged Condition: good  Hospital Course: [id number] [redacted]w[redacted]d Patient came for ECV due to breech presentation confirmed yesterday. Attempted ECV was unsuccessful. She tolerated the procedure well and was discharged after reassuring fetal monitoring was done.  Consults: None  Significant Diagnostic Studies: Bedside US  Treatments: IV hydration and terbutaline   Discharge Exam: Blood pressure 125/75, pulse 71, height  (1.778 m), weight 201 lb (91.2 kg), last menstrual period 07/22/2016. General appearance: alert, cooperative and no distress Resp: normal effort Pelvic: external genitalia normal and vagina normal without discharge, Cervix posterior and not effaced, 0.5 cm  Disposition: 01-Home or Self Care   Allergies as of 04/11/2017   No Known Allergies     Medication List    TAKE these medications   acetaminophen 325 MG tablet Commonly known as:  TYLENOL Take 650 mg by mouth every 6 (six) hours as needed.   cephALEXin 500 MG capsule Commonly known as:  KEFLEX Take 1 capsule (500 mg total) by mouth 4 (four) times daily.   COLACE PO Take by mouth.   cyclobenzaprine 10 MG tablet Commonly known as:  FLEXERIL Take 1 tablet (10 mg total) by mouth 3 (three) times daily as needed for muscle spasms.   FIBER ADULT GUMMIES PO Take by mouth.   methadone 10 MG/ML solution Commonly known as:  DOLOPHINE Take 107 mg by mouth daily.   ondansetron 4 MG tablet Commonly known as:  ZOFRAN Take 1 tablet (4 mg total) by mouth 2 (two) times daily.   pantoprazole 20 MG tablet Commonly known as:  PROTONIX Take 1 tablet (20 mg total) by mouth daily.   Prenatal Vitamins 0.8 MG  tablet Take 1 tablet by mouth daily.      Follow-up Information    [facility]. Go on 04/17/2017.   Why:  0920  Contact information: [address] [place] [address] [phone]          Signed: Scheryl Darter 04/11/2017, 11:05 AM

## 2017-04-11 NOTE — Discharge Instructions (Signed)
Fetal Movement Counts Patient Name: ________________________________________________ Patient Due Date: ____________________ What is a fetal movement count? A fetal movement count is the number of times that you feel your baby move during a certain amount of time. This may also be called a fetal kick count. A fetal movement count is recommended for every pregnant woman. You may be asked to start counting fetal movements as early as week 28 of your pregnancy. Pay attention to when your baby is most active. You may notice your baby's sleep and wake cycles. You may also notice things that make your baby move more. You should do a fetal movement count:  When your baby is normally most active.  At the same time each day.  A good time to count movements is while you are resting, after having something to eat and drink. How do I count fetal movements? 1. Find a quiet, comfortable area. Sit, or lie down on your side. 2. Write down the date, the start time and stop time, and the number of movements that you felt between those two times. Take this information with you to your health care visits. 3. For 2 hours, count kicks, flutters, swishes, rolls, and jabs. You should feel at least 10 movements during 2 hours. 4. You may stop counting after you have felt 10 movements. 5. If you do not feel 10 movements in 2 hours, have something to eat and drink. Then, keep resting and counting for 1 hour. If you feel at least 4 movements during that hour, you may stop counting. Contact a health care provider if:  You feel fewer than 4 movements in 2 hours.  Your baby is not moving like he or she usually does. Date: ____________ Start time: ____________ Stop time: ____________ Movements: ____________ Date: ____________ Start time: ____________ Stop time: ____________ Movements: ____________ Date: ____________ Start time: ____________ Stop time: ____________ Movements: ____________ Date: ____________ Start time:  ____________ Stop time: ____________ Movements: ____________ Date: ____________ Start time: ____________ Stop time: ____________ Movements: ____________ Date: ____________ Start time: ____________ Stop time: ____________ Movements: ____________ Date: ____________ Start time: ____________ Stop time: ____________ Movements: ____________ Date: ____________ Start time: ____________ Stop time: ____________ Movements: ____________ Date: ____________ Start time: ____________ Stop time: ____________ Movements: ____________ This information is not intended to replace advice given to you by your health care provider. Make sure you discuss any questions you have with your health care provider. Document Released: 07/31/2006 Document Revised: 02/28/2016 Document Reviewed: 08/10/2015 Elsevier Interactive Patient Education  [employer]. Cesarean Delivery Cesarean birth, or cesarean delivery, is the surgical delivery of a baby through an incision in the abdomen and the uterus. This may be referred to as a C-section. This procedure may be scheduled ahead of time, or it may be done in an emergency situation. Tell a health care provider about:  Any allergies you have.  All medicines you are taking, including vitamins, herbs, eye drops, creams, and over-the-counter medicines.  Any problems you or family members have had with anesthetic medicines.  Any blood disorders you have.  Any surgeries you have had.  Any medical conditions you have.  Whether you or any members of your family have a history of deep vein thrombosis (DVT) or pulmonary embolism (PE). What are the risks? Generally, this is a safe procedure. However, problems may occur, including:  Infection.  Bleeding.  Allergic reactions to medicines.  Damage to other structures or organs.  Blood clots.  Injury to your baby.  What happens before the  procedure?  Follow instructions from your health care provider about eating or  drinking restrictions.  Follow instructions from your health care provider about bathing before your procedure to help reduce your risk of infection.  If you know that you are going to have a cesarean delivery, do not shave your pubic area. Shaving before the procedure may increase your risk of infection.  Ask your health care provider about: ? Changing or stopping your regular medicines. This is especially important if you are taking diabetes medicines or blood thinners. ? Your pain management plan. This is especially important if you plan to breastfeed your baby. ? How long you will be in the hospital after the procedure. ? Any concerns you may have about receiving blood products if you need them during the procedure. ? Cord blood banking, if you plan to collect your babys umbilical cord blood.  You may also want to ask your health care provider: ? Whether you will be able to hold or breastfeed your baby while you are still in the operating room. ? Whether your baby can stay with you immediately after the procedure and during your recovery. ? Whether a family member or a person of your choice can go with you into the operating room and stay with you during the procedure, immediately after the procedure, and during your recovery.  Plan to have someone drive you home when you are discharged from the hospital. What happens during the procedure?  Fetal monitors will be placed on your abdomen to monitor your heart rate and your baby's heart rate.  Depending on the reason for your cesarean delivery, you may have a physical exam or additional testing, such as an ultrasound.  An IV tube will be inserted into one of your veins.  You may have your blood or urine tested.  You will be given antibiotic medicine to help prevent infection.  You may be given a special warming gown to wear to keep your temperature stable.  Hair may be removed from your pubic area.  The skin of your pubic area  and lower abdomen will be cleaned with a germ-killing solution (antiseptic).  A catheter may be inserted into your bladder through your urethra. This drains your urine during the procedure.  You may be given one or more of the following: ? A medicine to numb the area (local anesthetic). ? A medicine to make you fall asleep (general anesthetic). ? A medicine (regional anesthetic) that is injected into your back or through a small thin tube placed in your back (spinal anesthetic or epidural anesthetic). This numbs everything below the injection site and allows you to stay awake during your procedure. If this makes you feel nauseous, tell your health care provider. Medicines will be available to help reduce any nausea you may feel.  An incision will be made in your abdomen, and then in your uterus.  If you are awake during your procedure, you may feel tugging and pulling in your abdomen, but you should not feel pain. If you feel pain, tell your health care provider immediately.  Your baby will be removed from your uterus. You may feel more pressure or pushing while this happens.  Immediately after birth, your baby will be dried and kept warm. You may be able to hold and breastfeed your baby. The umbilical cord may be clamped and cut during this time.  Your placenta will be removed from your uterus.  Your incisions will be closed with stitches (sutures). Staples,  skin glue, or adhesive strips may also be applied to the incision in your abdomen.  Bandages (dressings) will be placed over the incision in your abdomen. The procedure may vary among health care providers and hospitals. What happens after the procedure?  Your blood pressure, heart rate, breathing rate, and blood oxygen level will be monitored often until the medicines you were given have worn off.  You may continue to receive fluids and medicines through an IV tube.  You will have some pain. Medicines will be available to help  control your pain.  To help prevent blood clots: ? You may be given medicines. ? You may have to wear compression stockings or devices. ? You will be encouraged to walk around when you are able.  Hospital staff will encourage and support bonding with your baby. Your hospital may allow you and your baby to stay in the same room (rooming in) during your hospital stay to encourage successful breastfeeding.  You may be encouraged to cough and breathe deeply often. This helps to prevent lung problems.  If you have a catheter draining your urine, it will be removed as soon as possible after your procedure. This information is not intended to replace advice given to you by your health care provider. Make sure you discuss any questions you have with your health care provider. Document Released: 07/01/2005 Document Revised: 12/07/2015 Document Reviewed: 04/11/2015 Elsevier Interactive Patient Education  2017 Elsevier Inc[employer] CorporationBraxton Hicks Contractions Contractions of the uterus can occur throughout pregnancy, but they are not always a sign that you are in labor. You may have practice contractions called Braxton Hicks contractions. These false labor contractions are sometimes confused with true labor. What are Deberah PeltonBraxton Hicks contractions? Braxton Hicks contractions are tightening movements that occur in the muscles of the uterus before labor. Unlike true labor contractions, these contractions do not result in opening (dilation) and thinning of the cervix. Toward the end of pregnancy (32-34 weeks), Braxton Hicks contractions can happen more often and may become stronger. These contractions are sometimes difficult to tell apart from true labor because they can be very uncomfortable. You should not feel embarrassed if you go to the hospital with false labor. Sometimes, the only way to tell if you are in true labor is for your health care provider to look for changes in the cervix. The health care provider will do  a physical exam and may monitor your contractions. If you are not in true labor, the exam should show that your cervix is not dilating and your water has not broken. If there are no prenatal problems or other health problems associated with your pregnancy, it is completely safe for you to be sent home with false labor. You may continue to have Braxton Hicks contractions until you go into true labor. How can I tell the difference between true labor and false labor?  Differences ? False labor ? Contractions last 30-70 seconds.: Contractions are usually shorter and not as strong as true labor contractions. ? Contractions become very regular.: Contractions are usually irregular. ? Discomfort is usually felt in the top of the uterus, and it spreads to the lower abdomen and low back.: Contractions are often felt in the front of the lower abdomen and in the groin. ? Contractions do not go away with walking.: Contractions may go away when you walk around or change positions while lying down. ? Contractions usually become more intense and increase in frequency.: Contractions get weaker and are shorter-lasting as time  goes on. ? The cervix dilates and gets thinner.: The cervix usually does not dilate or become thin. Follow these instructions at home:  Take over-the-counter and prescription medicines only as told by your health care provider.  Keep up with your usual exercises and follow other instructions from your health care provider.  Eat and drink lightly if you think you are going into labor.  If Braxton Hicks contractions are making you uncomfortable: ? Change your position from lying down or resting to walking, or change from walking to resting. ? Sit and rest in a tub of warm water. ? Drink enough fluid to keep your urine clear or pale yellow. Dehydration may cause these contractions. ? Do slow and deep breathing several times an hour.  Keep all follow-up prenatal visits as told by your  health care provider. This is important. Contact a health care provider if:  You have a fever.  You have continuous pain in your abdomen. Get help right away if:  Your contractions become stronger, more regular, and closer together.  You have fluid leaking or gushing from your vagina.  You pass blood-tinged mucus (bloody show).  You have bleeding from your vagina.  You have low back pain that you never had before.  You feel your babys head pushing down and causing pelvic pressure.  Your baby is not moving inside you as much as it used to. Summary  Contractions that occur before labor are called Braxton Hicks contractions, false labor, or practice contractions.  Braxton Hicks contractions are usually shorter, weaker, farther apart, and less regular than true labor contractions. True labor contractions usually become progressively stronger and regular and they become more frequent.  Manage discomfort from [facility] contractions by changing position, resting in a warm bath, drinking plenty of water, or practicing deep breathing. This information is not intended to replace advice given to you by your health care provider. Make sure you discuss any questions you have with your health care provider. Document Released: 07/01/2005 Document Revised: 05/20/2016 Document Reviewed: 05/20/2016 Elsevier Interactive Patient Education  2017 [employer].

## 2017-04-11 NOTE — Progress Notes (Signed)
External Cephalic Version  Preprocedure Diagnosis:  36 y.o. [id number]   at [redacted]w[redacted]d weeks gestational age with breech presentation  Post-procedure Diagnosis: 36 y.o.  Breech presentation  Procedure:  Attempted version  Procedure in detail:   The patient was brought to Labor and Delivery where a reactive fetal heart tracing was obtained.Marland Kitchen She was given 1 dose of subcutaneous terbutaline . A bedside ultrasound was performed which revealed single intrauterine pregnancy and breech presentation. There was noted to be adequate fluid. Using manual pressure, the breech was manipulated in a forward roll fashion and reverse but a vertex presentation was not obtained. Fetal heart tones were checked intermittently during the procedure and were noted to be reassuring. Following unsuccessful external cephalic version, the patient was placed on continuous external fetal monitoring. She was noted to have a reassuring and reactive tracing for 1 hour following the external cephalic version attempt. She did not have regular contractions and therefore she was felt to be stable for discharge to home. She was given appropriate labor instructions.   Adam Phenix, MD 04/11/2017

## 2017-04-17 ENCOUNTER — Ambulatory Visit (INDEPENDENT_AMBULATORY_CARE_PROVIDER_SITE_OTHER): Payer: Medicaid Other | Admitting: Student

## 2017-04-17 ENCOUNTER — Telehealth (HOSPITAL_COMMUNITY): Payer: Self-pay

## 2017-04-17 ENCOUNTER — Other Ambulatory Visit (HOSPITAL_COMMUNITY)
Admission: RE | Admit: 2017-04-17 | Discharge: 2017-04-17 | Disposition: A | Discharge status: Home / Self Care | Payer: Medicaid Other | Source: Ambulatory Visit | Attending: Student | Admitting: Student

## 2017-04-17 VITALS — BP 127/83 | HR 99 | Wt 202.9 lb

## 2017-04-17 DIAGNOSIS — O321XX0 Maternal care for breech presentation, not applicable or unspecified: Secondary | ICD-10-CM | POA: Diagnosis not present

## 2017-04-17 DIAGNOSIS — N898 Other specified noninflammatory disorders of vagina: Secondary | ICD-10-CM | POA: Insufficient documentation

## 2017-04-17 MED ORDER — TERCONAZOLE 0.4 % VA CREA: 1.0000 | TOPICAL_CREAM | Freq: Every day | VAGINAL | 0 refills | Status: DC

## 2017-04-17 NOTE — Progress Notes (Signed)
   PRENATAL VISIT NOTE  Subjective:  [name] is a 36 y.o. [id number] at [redacted]w[redacted]d being seen today for ongoing prenatal care.  She is currently monitored for the following issues for this high-risk pregnancy and has Heroin dependence (HCC); Methadone maintenance treatment complicating pregnancy (HCC); Supervision of high-risk pregnancy; Hepatitis C, chronic, maternal, antepartum (HCC); AMA (advanced maternal age) multigravida 35+; Rubella non-immune status, antepartum; Low grade squamous intraepithelial lesion on cytologic smear of cervix (LGSIL); GERD (gastroesophageal reflux disease); Headache, common migraine; Breech presentation, no version; and Breech presentation on her problem list.  Patient reports occasional contractions.  Contractions: Irregular. Vag. Bleeding: None.  Movement: Present. Denies leaking of fluid.   The following portions of the patient's history were reviewed and updated as appropriate: allergies, current medications, past family history, past medical history, past social history, past surgical history and problem list. Problem list updated.  Objective:   Vitals:   04/17/17 0952  BP: 127/83  Pulse: 99  Weight: 92 kg (202 lb 14.4 oz)    Fetal Status: Fetal Heart Rate (bpm): 140 Fundal Height: 37 cm Movement: Present     General:  Alert, oriented and cooperative. Patient is in no acute distress.  Skin: Skin is warm and dry. No rash noted.   Cardiovascular: Normal heart rate noted  Respiratory: Normal respiratory effort, no problems with respiration noted  Abdomen: Soft, gravid, appropriate for gestational age.  Pain/Pressure: Present     Pelvic: Cervical exam performed 0.5        Extremities: Normal range of motion.  Edema: Trace  Mental Status:  Normal mood and affect. Normal behavior. Normal judgment and thought content.   Assessment and Plan:  Pregnancy: [id number] at [redacted]w[redacted]d  1. Breech presentation, single or unspecified fetus -Msg sent for scheduling  c-section at 39 weeks. Patient very upset at having to wait because she does not have childcare during the week. Explained to patient that we cannot schedule her earlier than 39 weeks, but we strategized about who could help during the surgery and hospital stay.   2. Vaginal irritation  - Cervicovaginal ancillary only RX for terazol sent.   Term labor symptoms and general obstetric precautions including but not limited to vaginal bleeding, contractions, leaking of fluid and fetal movement were reviewed in detail with the patient. Please refer to After Visit Summary for other counseling recommendations.  Return in about 1 week (around 04/24/2017).   Marylene Land, CNM

## 2017-04-17 NOTE — Telephone Encounter (Signed)
Called patient to give her surgery date and time, no answer, unable to leave voicemail (mailbox is is full)

## 2017-04-17 NOTE — Telephone Encounter (Signed)
-----   Message from Marylene Land, CNM sent at 04/17/2017 11:21 AM EDT ----- Regarding: please schedule for breech c-section on Monday Hi Jacinda! This patient should be scheduled for a c-section for breech on Monday, October 8.  Thank you!!  Luna Kitchens

## 2017-04-17 NOTE — Patient Instructions (Signed)
Cesarean Delivery °Cesarean birth, or cesarean delivery, is the surgical delivery of a baby through an incision in the abdomen and the uterus. This may be referred to as a C-section. This procedure may be scheduled ahead of time, or it may be done in an emergency situation. °Tell a health care provider about: °· Any allergies you have. °· All medicines you are taking, including vitamins, herbs, eye drops, creams, and over-the-counter medicines. °· Any problems you or family members have had with anesthetic medicines. °· Any blood disorders you have. °· Any surgeries you have had. °· Any medical conditions you have. °· Whether you or any members of your family have a history of deep vein thrombosis (DVT) or pulmonary embolism (PE). °What are the risks? °Generally, this is a safe procedure. However, problems may occur, including: °· Infection. °· Bleeding. °· Allergic reactions to medicines. °· Damage to other structures or organs. °· Blood clots. °· Injury to your baby. ° °What happens before the procedure? °· Follow instructions from your health care provider about eating or drinking restrictions. °· Follow instructions from your health care provider about bathing before your procedure to help reduce your risk of infection. °· If you know that you are going to have a cesarean delivery, do not shave your pubic area. Shaving before the procedure may increase your risk of infection. °· Ask your health care provider about: °? Changing or stopping your regular medicines. This is especially important if you are taking diabetes medicines or blood thinners. °? Your pain management plan. This is especially important if you plan to breastfeed your baby. °? How long you will be in the hospital after the procedure. °? Any concerns you may have about receiving blood products if you need them during the procedure. °? Cord blood banking, if you plan to collect your baby’s umbilical cord blood. °· You may also want to ask your  health care provider: °? Whether you will be able to hold or breastfeed your baby while you are still in the operating room. °? Whether your baby can stay with you immediately after the procedure and during your recovery. °? Whether a family member or a person of your choice can go with you into the operating room and stay with you during the procedure, immediately after the procedure, and during your recovery. °· Plan to have someone drive you home when you are discharged from the hospital. °What happens during the procedure? °· Fetal monitors will be placed on your abdomen to monitor your heart rate and your baby's heart rate. °· Depending on the reason for your cesarean delivery, you may have a physical exam or additional testing, such as an ultrasound. °· An IV tube will be inserted into one of your veins. °· You may have your blood or urine tested. °· You will be given antibiotic medicine to help prevent infection. °· You may be given a special warming gown to wear to keep your temperature stable. °· Hair may be removed from your pubic area. °· The skin of your pubic area and lower abdomen will be cleaned with a germ-killing solution (antiseptic). °· A catheter may be inserted into your bladder through your urethra. This drains your urine during the procedure. °· You may be given one or more of the following: °? A medicine to numb the area (local anesthetic). °? A medicine to make you fall asleep (general anesthetic). °? A medicine (regional anesthetic) that is injected into your back or through a small   thin tube placed in your back (spinal anesthetic or epidural anesthetic). This numbs everything below the injection site and allows you to stay awake during your procedure. If this makes you feel nauseous, tell your health care provider. Medicines will be available to help reduce any nausea you may feel. °· An incision will be made in your abdomen, and then in your uterus. °· If you are awake during your  procedure, you may feel tugging and pulling in your abdomen, but you should not feel pain. If you feel pain, tell your health care provider immediately. °· Your baby will be removed from your uterus. You may feel more pressure or pushing while this happens. °· Immediately after birth, your baby will be dried and kept warm. You may be able to hold and breastfeed your baby. The umbilical cord may be clamped and cut during this time. °· Your placenta will be removed from your uterus. °· Your incisions will be closed with stitches (sutures). Staples, skin glue, or adhesive strips may also be applied to the incision in your abdomen. °· Bandages (dressings) will be placed over the incision in your abdomen. °The procedure may vary among health care providers and hospitals. °What happens after the procedure? °· Your blood pressure, heart rate, breathing rate, and blood oxygen level will be monitored often until the medicines you were given have worn off. °· You may continue to receive fluids and medicines through an IV tube. °· You will have some pain. Medicines will be available to help control your pain. °· To help prevent blood clots: °? You may be given medicines. °? You may have to wear compression stockings or devices. °? You will be encouraged to walk around when you are able. °· Hospital staff will encourage and support bonding with your baby. Your hospital may allow you and your baby to stay in the same room (rooming in) during your hospital stay to encourage successful breastfeeding. °· You may be encouraged to cough and breathe deeply often. This helps to prevent lung problems. °· If you have a catheter draining your urine, it will be removed as soon as possible after your procedure. °This information is not intended to replace advice given to you by your health care provider. Make sure you discuss any questions you have with your health care provider. °Document Released: 07/01/2005 Document Revised: 12/07/2015  Document Reviewed: 04/11/2015 °Elsevier Interactive Patient Education © 2017 [employer] ° °

## 2017-04-18 ENCOUNTER — Other Ambulatory Visit: Payer: Self-pay | Admitting: Obstetrics & Gynecology

## 2017-04-18 LAB — CERVICOVAGINAL ANCILLARY ONLY
Bacterial vaginitis: NEGATIVE
Candida vaginitis: NEGATIVE

## 2017-04-20 ENCOUNTER — Inpatient Hospital Stay (HOSPITAL_COMMUNITY)
Admission: AD | Admit: 2017-04-20 | Discharge: 2017-04-23 | DRG: 787 | Disposition: A | Discharge status: Home / Self Care | Payer: Medicaid Other | Source: Ambulatory Visit | Attending: Obstetrics & Gynecology | Admitting: Obstetrics & Gynecology

## 2017-04-20 ENCOUNTER — Encounter (HOSPITAL_COMMUNITY): Payer: Self-pay

## 2017-04-20 ENCOUNTER — Inpatient Hospital Stay (HOSPITAL_COMMUNITY): Payer: Medicaid Other | Admitting: Anesthesiology

## 2017-04-20 ENCOUNTER — Encounter (HOSPITAL_COMMUNITY)
Admission: AD | Disposition: A | Discharge status: Home / Self Care | Payer: Self-pay | Source: Ambulatory Visit | Attending: Obstetrics & Gynecology

## 2017-04-20 DIAGNOSIS — O99324 Drug use complicating childbirth: Secondary | ICD-10-CM | POA: Diagnosis present

## 2017-04-20 DIAGNOSIS — B182 Chronic viral hepatitis C: Secondary | ICD-10-CM | POA: Diagnosis present

## 2017-04-20 DIAGNOSIS — O09529 Supervision of elderly multigravida, unspecified trimester: Secondary | ICD-10-CM

## 2017-04-20 DIAGNOSIS — O9842 Viral hepatitis complicating childbirth: Secondary | ICD-10-CM | POA: Diagnosis present

## 2017-04-20 DIAGNOSIS — O99320 Drug use complicating pregnancy, unspecified trimester: Secondary | ICD-10-CM

## 2017-04-20 DIAGNOSIS — O321XX0 Maternal care for breech presentation, not applicable or unspecified: Principal | ICD-10-CM | POA: Diagnosis present

## 2017-04-20 DIAGNOSIS — F1120 Opioid dependence, uncomplicated: Secondary | ICD-10-CM | POA: Diagnosis present

## 2017-04-20 DIAGNOSIS — Z2839 Other underimmunization status: Secondary | ICD-10-CM

## 2017-04-20 DIAGNOSIS — Z87891 Personal history of nicotine dependence: Secondary | ICD-10-CM | POA: Diagnosis not present

## 2017-04-20 DIAGNOSIS — Z283 Underimmunization status: Secondary | ICD-10-CM

## 2017-04-20 DIAGNOSIS — Z3A38 38 weeks gestation of pregnancy: Secondary | ICD-10-CM | POA: Diagnosis not present

## 2017-04-20 DIAGNOSIS — Z98891 History of uterine scar from previous surgery: Secondary | ICD-10-CM

## 2017-04-20 DIAGNOSIS — O9989 Other specified diseases and conditions complicating pregnancy, childbirth and the puerperium: Secondary | ICD-10-CM

## 2017-04-20 DIAGNOSIS — O26893 Other specified pregnancy related conditions, third trimester: Secondary | ICD-10-CM | POA: Diagnosis present

## 2017-04-20 LAB — TYPE AND SCREEN
ABO/RH(D): O POS
Antibody Screen: NEGATIVE

## 2017-04-20 LAB — CBC
HEMATOCRIT: 32.4 % — AB (ref 36.0–46.0)
Hemoglobin: 11.4 g/dL — ABNORMAL LOW (ref 12.0–15.0)
MCH: 31.4 pg (ref 26.0–34.0)
MCHC: 35.2 g/dL (ref 30.0–36.0)
MCV: 89.3 fL (ref 78.0–100.0)
PLATELETS: 228 10*3/uL (ref 150–400)
RBC: 3.63 MIL/uL — AB (ref 3.87–5.11)
RDW: 13.2 % (ref 11.5–15.5)
WBC: 6.6 10*3/uL (ref 4.0–10.5)

## 2017-04-20 LAB — ABO/RH: ABO/RH(D): O POS

## 2017-04-20 SURGERY — Surgical Case
Anesthesia: Spinal

## 2017-04-20 MED ORDER — OXYCODONE-ACETAMINOPHEN 5-325 MG PO TABS
2.0000 | ORAL_TABLET | ORAL | Status: DC | PRN
  Administered 2017-04-21: 2 via ORAL
  Filled 2017-04-20: qty 2

## 2017-04-20 MED ORDER — FAMOTIDINE IN NACL 20-0.9 MG/50ML-% IV SOLN: 20.0000 mg | Freq: Once | INTRAVENOUS | Status: DC

## 2017-04-20 MED ORDER — OXYTOCIN 40 UNITS IN LACTATED RINGERS INFUSION - SIMPLE MED: 2.5000 [IU]/h | INTRAVENOUS | Status: AC

## 2017-04-20 MED ORDER — LACTATED RINGERS IV SOLN
INTRAVENOUS | Status: DC
  Administered 2017-04-20: 18:00:00 via INTRAVENOUS

## 2017-04-20 MED ORDER — METHYLERGONOVINE MALEATE 0.2 MG PO TABS: 0.2000 mg | ORAL_TABLET | ORAL | Status: DC | PRN

## 2017-04-20 MED ORDER — OXYTOCIN 10 UNIT/ML IJ SOLN
INTRAVENOUS | Status: DC | PRN
  Administered 2017-04-20: 40 [IU] via INTRAVENOUS

## 2017-04-20 MED ORDER — BUPIVACAINE IN DEXTROSE 0.75-8.25 % IT SOLN
INTRATHECAL | Status: DC | PRN
  Administered 2017-04-20: 1.7 mL via INTRATHECAL

## 2017-04-20 MED ORDER — DIPHENHYDRAMINE HCL 50 MG/ML IJ SOLN: 12.5000 mg | INTRAMUSCULAR | Status: DC | PRN

## 2017-04-20 MED ORDER — DIPHENHYDRAMINE HCL 25 MG PO CAPS
25.0000 mg | ORAL_CAPSULE | ORAL | Status: DC | PRN
  Filled 2017-04-20: qty 1

## 2017-04-20 MED ORDER — METHADONE HCL 10 MG/ML PO CONC
107.0000 mg | Freq: Every day | ORAL | Status: DC
  Administered 2017-04-21 – 2017-04-23 (×3): 107 mg via ORAL
  Filled 2017-04-20 (×3): qty 10.7

## 2017-04-20 MED ORDER — LACTATED RINGERS IV SOLN
INTRAVENOUS | Status: DC | PRN
  Administered 2017-04-20: 11:00:00 via INTRAVENOUS

## 2017-04-20 MED ORDER — ONDANSETRON HCL 4 MG/2ML IJ SOLN
INTRAMUSCULAR | Status: AC
  Filled 2017-04-20: qty 2

## 2017-04-20 MED ORDER — SOD CITRATE-CITRIC ACID 500-334 MG/5ML PO SOLN
ORAL | Status: AC
Start: 2017-04-20 — End: 2017-04-20
  Filled 2017-04-20: qty 15

## 2017-04-20 MED ORDER — LACTATED RINGERS IV SOLN
INTRAVENOUS | Status: DC | PRN
  Administered 2017-04-20 (×2): via INTRAVENOUS

## 2017-04-20 MED ORDER — KETOROLAC TROMETHAMINE 30 MG/ML IJ SOLN: 30.0000 mg | Freq: Four times a day (QID) | INTRAMUSCULAR | Status: DC | PRN

## 2017-04-20 MED ORDER — MORPHINE SULFATE (PF) 0.5 MG/ML IJ SOLN
INTRAMUSCULAR | Status: DC | PRN
  Administered 2017-04-20: .2 mg via EPIDURAL

## 2017-04-20 MED ORDER — SIMETHICONE 80 MG PO CHEW
80.0000 mg | CHEWABLE_TABLET | ORAL | Status: DC
  Administered 2017-04-21 – 2017-04-23 (×3): 80 mg via ORAL
  Filled 2017-04-20 (×3): qty 1

## 2017-04-20 MED ORDER — MEPERIDINE HCL 25 MG/ML IJ SOLN
6.2500 mg | INTRAMUSCULAR | Status: DC | PRN
Start: 2017-04-20 — End: 2017-04-20

## 2017-04-20 MED ORDER — DEXAMETHASONE SODIUM PHOSPHATE 10 MG/ML IJ SOLN
INTRAMUSCULAR | Status: AC
  Filled 2017-04-20: qty 1

## 2017-04-20 MED ORDER — METHYLERGONOVINE MALEATE 0.2 MG/ML IJ SOLN: 0.2000 mg | INTRAMUSCULAR | Status: DC | PRN

## 2017-04-20 MED ORDER — COCONUT OIL OIL
1.0000 "application " | TOPICAL_OIL | Status: DC | PRN
  Administered 2017-04-22: 1 via TOPICAL
  Filled 2017-04-20: qty 120

## 2017-04-20 MED ORDER — PRENATAL MULTIVITAMIN CH
ORAL_TABLET | Freq: Every day | ORAL | Status: DC
  Administered 2017-04-23: 1 via ORAL
  Filled 2017-04-20 (×3): qty 1

## 2017-04-20 MED ORDER — GABAPENTIN 300 MG PO CAPS
300.0000 mg | ORAL_CAPSULE | Freq: Three times a day (TID) | ORAL | Status: DC
  Administered 2017-04-20 – 2017-04-23 (×8): 300 mg via ORAL
  Filled 2017-04-20 (×9): qty 1

## 2017-04-20 MED ORDER — ONDANSETRON HCL 4 MG/2ML IJ SOLN
INTRAMUSCULAR | Status: DC | PRN
  Administered 2017-04-20: 4 mg via INTRAVENOUS

## 2017-04-20 MED ORDER — TETANUS-DIPHTH-ACELL PERTUSSIS 5-2.5-18.5 LF-MCG/0.5 IM SUSP: 0.5000 mL | Freq: Once | INTRAMUSCULAR | Status: DC

## 2017-04-20 MED ORDER — OXYTOCIN 10 UNIT/ML IJ SOLN
INTRAMUSCULAR | Status: AC
  Filled 2017-04-20: qty 4

## 2017-04-20 MED ORDER — SIMETHICONE 80 MG PO CHEW: 80.0000 mg | CHEWABLE_TABLET | ORAL | Status: DC | PRN

## 2017-04-20 MED ORDER — DIPHENHYDRAMINE HCL 25 MG PO CAPS: 25.0000 mg | ORAL_CAPSULE | Freq: Four times a day (QID) | ORAL | Status: DC | PRN

## 2017-04-20 MED ORDER — BUPIVACAINE LIPOSOME 1.3 % IJ SUSP
20.0000 mL | Freq: Once | INTRAMUSCULAR | Status: AC
  Administered 2017-04-20: 266 mg
  Filled 2017-04-20: qty 20

## 2017-04-20 MED ORDER — BUPIVACAINE IN DEXTROSE 0.75-8.25 % IT SOLN
INTRATHECAL | Status: AC
  Filled 2017-04-20: qty 2

## 2017-04-20 MED ORDER — ZOLPIDEM TARTRATE 5 MG PO TABS: 5.0000 mg | ORAL_TABLET | Freq: Every evening | ORAL | Status: DC | PRN

## 2017-04-20 MED ORDER — ACETAMINOPHEN 325 MG PO TABS: 650.0000 mg | ORAL_TABLET | ORAL | Status: DC | PRN

## 2017-04-20 MED ORDER — SCOPOLAMINE 1 MG/3DAYS TD PT72: 1.0000 | MEDICATED_PATCH | Freq: Once | TRANSDERMAL | Status: DC

## 2017-04-20 MED ORDER — CEFAZOLIN SODIUM-DEXTROSE 2-4 GM/100ML-% IV SOLN
2.0000 g | INTRAVENOUS | Status: AC
  Administered 2017-04-20: 2 g via INTRAVENOUS
  Filled 2017-04-20: qty 100

## 2017-04-20 MED ORDER — NALOXONE HCL 0.4 MG/ML IJ SOLN: 0.4000 mg | INTRAMUSCULAR | Status: DC | PRN

## 2017-04-20 MED ORDER — FENTANYL CITRATE (PF) 100 MCG/2ML IJ SOLN: 25.0000 ug | INTRAMUSCULAR | Status: DC | PRN

## 2017-04-20 MED ORDER — CYCLOBENZAPRINE HCL 10 MG PO TABS
10.0000 mg | ORAL_TABLET | Freq: Three times a day (TID) | ORAL | Status: DC | PRN
  Filled 2017-04-20: qty 1

## 2017-04-20 MED ORDER — DIBUCAINE 1 % RE OINT: 1.0000 "application " | TOPICAL_OINTMENT | RECTAL | Status: DC | PRN

## 2017-04-20 MED ORDER — DOCUSATE SODIUM 100 MG PO CAPS
100.0000 mg | ORAL_CAPSULE | Freq: Two times a day (BID) | ORAL | Status: DC
  Administered 2017-04-21 – 2017-04-23 (×6): 100 mg via ORAL
  Filled 2017-04-20 (×6): qty 1

## 2017-04-20 MED ORDER — SOD CITRATE-CITRIC ACID 500-334 MG/5ML PO SOLN
30.0000 mL | Freq: Once | ORAL | Status: AC
  Administered 2017-04-20: 30 mL via ORAL

## 2017-04-20 MED ORDER — LACTATED RINGERS IV SOLN
INTRAVENOUS | Status: DC
  Administered 2017-04-20: 09:00:00 via INTRAVENOUS

## 2017-04-20 MED ORDER — KETOROLAC TROMETHAMINE 30 MG/ML IJ SOLN
30.0000 mg | Freq: Four times a day (QID) | INTRAMUSCULAR | Status: DC | PRN
  Administered 2017-04-20: 30 mg via INTRAMUSCULAR

## 2017-04-20 MED ORDER — PRENATAL MULTIVITAMIN CH
1.0000 | ORAL_TABLET | Freq: Every day | ORAL | Status: DC
  Administered 2017-04-21 – 2017-04-22 (×2): 1 via ORAL

## 2017-04-20 MED ORDER — FENTANYL CITRATE (PF) 100 MCG/2ML IJ SOLN
INTRAMUSCULAR | Status: DC | PRN
  Administered 2017-04-20: 50 ug via INTRAVENOUS
  Administered 2017-04-20: 20 ug via INTRAVENOUS

## 2017-04-20 MED ORDER — TERBUTALINE SULFATE 1 MG/ML IJ SOLN
0.2500 mg | Freq: Once | INTRAMUSCULAR | Status: AC
  Administered 2017-04-20: 0.25 mg via SUBCUTANEOUS
  Filled 2017-04-20: qty 1

## 2017-04-20 MED ORDER — PANTOPRAZOLE SODIUM 20 MG PO TBEC
20.0000 mg | DELAYED_RELEASE_TABLET | Freq: Every day | ORAL | Status: DC
  Administered 2017-04-20 – 2017-04-23 (×3): 20 mg via ORAL
  Filled 2017-04-20 (×4): qty 1

## 2017-04-20 MED ORDER — MEPERIDINE HCL 25 MG/ML IJ SOLN: 6.2500 mg | INTRAMUSCULAR | Status: DC | PRN

## 2017-04-20 MED ORDER — SOD CITRATE-CITRIC ACID 500-334 MG/5ML PO SOLN: 30.0000 mL | Freq: Once | ORAL | Status: DC

## 2017-04-20 MED ORDER — SODIUM CHLORIDE 0.9 % IR SOLN
Status: DC | PRN
  Administered 2017-04-20: 1

## 2017-04-20 MED ORDER — DEXAMETHASONE SODIUM PHOSPHATE 4 MG/ML IJ SOLN
INTRAMUSCULAR | Status: DC | PRN
  Administered 2017-04-20: 4 mg via INTRAVENOUS

## 2017-04-20 MED ORDER — FENTANYL CITRATE (PF) 100 MCG/2ML IJ SOLN
INTRAMUSCULAR | Status: AC
  Filled 2017-04-20: qty 2

## 2017-04-20 MED ORDER — KETOROLAC TROMETHAMINE 30 MG/ML IJ SOLN
INTRAMUSCULAR | Status: AC
  Administered 2017-04-21: 30 mg via INTRAMUSCULAR
  Filled 2017-04-20: qty 1

## 2017-04-20 MED ORDER — SODIUM CHLORIDE 0.9 % IJ SOLN
INTRAMUSCULAR | Status: AC
  Filled 2017-04-20: qty 10

## 2017-04-20 MED ORDER — FAMOTIDINE IN NACL 20-0.9 MG/50ML-% IV SOLN
INTRAVENOUS | Status: AC
  Administered 2017-04-20: 20 mg
  Filled 2017-04-20: qty 50

## 2017-04-20 MED ORDER — ONDANSETRON HCL 4 MG/2ML IJ SOLN: 4.0000 mg | Freq: Three times a day (TID) | INTRAMUSCULAR | Status: DC | PRN

## 2017-04-20 MED ORDER — OXYCODONE-ACETAMINOPHEN 5-325 MG PO TABS: 1.0000 | ORAL_TABLET | ORAL | Status: DC | PRN

## 2017-04-20 MED ORDER — SENNOSIDES-DOCUSATE SODIUM 8.6-50 MG PO TABS
2.0000 | ORAL_TABLET | ORAL | Status: DC
  Administered 2017-04-21 – 2017-04-23 (×3): 2 via ORAL
  Filled 2017-04-20 (×3): qty 2

## 2017-04-20 MED ORDER — KETOROLAC TROMETHAMINE 30 MG/ML IJ SOLN: 30.0000 mg | Freq: Three times a day (TID) | INTRAMUSCULAR | Status: DC

## 2017-04-20 MED ORDER — SIMETHICONE 80 MG PO CHEW
80.0000 mg | CHEWABLE_TABLET | Freq: Three times a day (TID) | ORAL | Status: DC
  Administered 2017-04-20 – 2017-04-23 (×7): 80 mg via ORAL
  Filled 2017-04-20 (×7): qty 1

## 2017-04-20 MED ORDER — IBUPROFEN 600 MG PO TABS
600.0000 mg | ORAL_TABLET | Freq: Four times a day (QID) | ORAL | Status: DC
Start: 2017-04-20 — End: 2017-04-21
  Administered 2017-04-20 – 2017-04-21 (×3): 600 mg via ORAL
  Filled 2017-04-20 (×3): qty 1

## 2017-04-20 MED ORDER — PHENYLEPHRINE 8 MG IN D5W 100 ML (0.08MG/ML) PREMIX OPTIME
INJECTION | INTRAVENOUS | Status: DC | PRN
  Administered 2017-04-20: 60 ug/min via INTRAVENOUS

## 2017-04-20 MED ORDER — ONDANSETRON HCL 4 MG/2ML IJ SOLN: 4.0000 mg | Freq: Once | INTRAMUSCULAR | Status: DC | PRN

## 2017-04-20 MED ORDER — MORPHINE SULFATE (PF) 0.5 MG/ML IJ SOLN
INTRAMUSCULAR | Status: AC
  Filled 2017-04-20: qty 10

## 2017-04-20 MED ORDER — SODIUM CHLORIDE 0.9% FLUSH: 3.0000 mL | INTRAVENOUS | Status: DC | PRN

## 2017-04-20 MED ORDER — WITCH HAZEL-GLYCERIN EX PADS: 1.0000 "application " | MEDICATED_PAD | CUTANEOUS | Status: DC | PRN

## 2017-04-20 MED ORDER — MENTHOL 3 MG MT LOZG: 1.0000 | LOZENGE | OROMUCOSAL | Status: DC | PRN

## 2017-04-20 SURGICAL SUPPLY — 45 items
BENZOIN TINCTURE PRP APPL 2/3 (GAUZE/BANDAGES/DRESSINGS) ×3 IMPLANT
CHLORAPREP W/TINT 26ML (MISCELLANEOUS) ×3 IMPLANT
CLAMP CORD UMBIL (MISCELLANEOUS) IMPLANT
CLOSURE WOUND 1/2 X4 (GAUZE/BANDAGES/DRESSINGS) ×1
CLOTH BEACON ORANGE TIMEOUT ST (SAFETY) ×3 IMPLANT
DECANTER SPIKE VIAL GLASS SM (MISCELLANEOUS) ×3 IMPLANT
DERMABOND ADVANCED (GAUZE/BANDAGES/DRESSINGS) ×2
DERMABOND ADVANCED .7 DNX12 (GAUZE/BANDAGES/DRESSINGS) ×1 IMPLANT
DRAPE C SECTION CLR SCREEN (DRAPES) IMPLANT
DRSG OPSITE POSTOP 4X10 (GAUZE/BANDAGES/DRESSINGS) ×3 IMPLANT
ELECT REM PT RETURN 9FT ADLT (ELECTROSURGICAL) ×3
ELECTRODE REM PT RTRN 9FT ADLT (ELECTROSURGICAL) ×1 IMPLANT
EXTRACTOR VACUUM M CUP 4 TUBE (SUCTIONS) IMPLANT
EXTRACTOR VACUUM M CUP 4' TUBE (SUCTIONS)
GLOVE BIO SURGEON STRL SZ7.5 (GLOVE) ×3 IMPLANT
GLOVE BIOGEL PI IND STRL 7.0 (GLOVE) ×1 IMPLANT
GLOVE BIOGEL PI INDICATOR 7.0 (GLOVE) ×2
GOWN STRL REUS W/TWL 2XL LVL3 (GOWN DISPOSABLE) ×3 IMPLANT
GOWN STRL REUS W/TWL LRG LVL3 (GOWN DISPOSABLE) ×6 IMPLANT
KIT ABG SYR 3ML LUER SLIP (SYRINGE) IMPLANT
NEEDLE HYPO 22GX1.5 SAFETY (NEEDLE) ×3 IMPLANT
NEEDLE HYPO 25X5/8 SAFETYGLIDE (NEEDLE) IMPLANT
NS IRRIG 1000ML POUR BTL (IV SOLUTION) ×3 IMPLANT
PACK C SECTION WH (CUSTOM PROCEDURE TRAY) ×3 IMPLANT
PAD OB MATERNITY 4.3X12.25 (PERSONAL CARE ITEMS) ×3 IMPLANT
PENCIL SMOKE EVAC W/HOLSTER (ELECTROSURGICAL) ×3 IMPLANT
RTRCTR C-SECT PINK 25CM LRG (MISCELLANEOUS) ×3 IMPLANT
STRIP CLOSURE SKIN 1/2X4 (GAUZE/BANDAGES/DRESSINGS) ×2 IMPLANT
SUT CHROMIC 1 CTX 36 (SUTURE) ×6 IMPLANT
SUT MNCRL 0 VIOLET CTX 36 (SUTURE) ×3 IMPLANT
SUT MONOCRYL 0 CTX 36 (SUTURE) ×6
SUT VIC AB 1 CT1 27 (SUTURE) ×4
SUT VIC AB 1 CT1 27XBRD ANTBC (SUTURE) ×2 IMPLANT
SUT VIC AB 2-0 CT1 (SUTURE) ×3 IMPLANT
SUT VIC AB 2-0 CT1 27 (SUTURE) ×2
SUT VIC AB 2-0 CT1 TAPERPNT 27 (SUTURE) ×1 IMPLANT
SUT VIC AB 3-0 CT1 27 (SUTURE) ×4
SUT VIC AB 3-0 CT1 TAPERPNT 27 (SUTURE) ×2 IMPLANT
SUT VIC AB 3-0 SH 27 (SUTURE)
SUT VIC AB 3-0 SH 27X BRD (SUTURE) IMPLANT
SUT VIC AB 4-0 KS 27 (SUTURE) ×3 IMPLANT
SYR 20CC LL (SYRINGE) ×6 IMPLANT
SYR BULB IRRIGATION 50ML (SYRINGE) IMPLANT
TOWEL OR 17X24 6PK STRL BLUE (TOWEL DISPOSABLE) ×3 IMPLANT
TRAY FOLEY BAG SILVER LF 14FR (SET/KITS/TRAYS/PACK) ×3 IMPLANT

## 2017-04-20 NOTE — Anesthesia Preprocedure Evaluation (Signed)
Anesthesia Evaluation  Patient identified by MRN, date of birth, ID band Patient awake    Reviewed: Allergy & Precautions, H&P , NPO status , Patient's Chart, lab work & pertinent test results, reviewed documented beta blocker date and time   Airway Mallampati: I  TM Distance: >3 FB Neck ROM: full    Dental no notable dental hx.    Pulmonary neg pulmonary ROS, former smoker,    Pulmonary exam normal breath sounds clear to auscultation       Cardiovascular negative cardio ROS Normal cardiovascular exam(-) dysrhythmias  Rhythm:regular Rate:Normal     Neuro/Psych  Headaches, PSYCHIATRIC DISORDERS Anxiety negative neurological ROS  negative psych ROS   GI/Hepatic negative GI ROS, Neg liver ROS, GERD  Medicated,(+) Hepatitis -, C  Endo/Other  negative endocrine ROS  Renal/GU negative Renal ROS  negative genitourinary   Musculoskeletal   Abdominal   Peds  Hematology negative hematology ROS (+)   Anesthesia Other Findings Methadone Dependence   Reproductive/Obstetrics (+) Pregnancy                             Anesthesia Physical Anesthesia Plan  ASA: III and emergent  Anesthesia Plan: Spinal   Post-op Pain Management:    Induction:   PONV Risk Score and Plan: 2 and Ondansetron, Dexamethasone, Treatment may vary due to age or medical condition and Treatment may vary due to age  Airway Management Planned: Nasal Cannula  Additional Equipment:   Intra-op Plan:   Post-operative Plan:   Informed Consent: I have reviewed the patients History and Physical, chart, labs and discussed the procedure including the risks, benefits and alternatives for the proposed anesthesia with the patient or authorized representative who has indicated his/her understanding and acceptance.   Dental advisory given  Plan Discussed with:   Anesthesia Plan Comments:         Anesthesia Quick Evaluation

## 2017-04-20 NOTE — MAU Note (Signed)
Ctx since yesterday. Got worse last night. States she is having some dark brown thick blood, and some bright red. No LOF. + fetal movement

## 2017-04-20 NOTE — MAU Provider Note (Signed)
Called in to room to confirm presentation of fetal head Informal BS U/S performed: breech presentation confirmed with fetal in maternal RUQ.  NST - FHR: 130 bpm / moderate variability / accels present / decels absent / TOCO: regular every 3-4 mins  Raelyn Mora, CNM 04/20/2017 8:38 AM

## 2017-04-20 NOTE — Anesthesia Procedure Notes (Signed)
Spinal

## 2017-04-20 NOTE — Op Note (Signed)
Preoperative diagnosis:  1.  Intrauterine pregnancy at [redacted]w[redacted]d  weeks gestation                                         2.  Breech presentation, S/P failed ECV and declines trial of labor                                         3.  Active Labor                                         4.  Methadone maintenance therapy                                         5.  +Hepatitis C virus    Postoperative diagnosis:  Same as above   Procedure:  Primary cesarean section  Surgeon:  Lazaro Arms MD  Assistant:    Anesthesia: Spinal  Findings:  Patient presented to the maternity admission unit today with complaint of regular uterine contractions for the past couple of hours.  External fetal monitoring confirmed reactive NST and regular uterine contractions.  Fetal presentation known to be frank breech status post a failed external cephalic version.  Patient declined trial of labor.  Patient aware of postoperative pain management challenges  with patient's on methadone maintenance therapy.  Patient opts to proceed with a primary low transverse cesarean section.  Over a low transverse incision was delivered a viable female with Apgars of 9 and 9 weighing pending lbs.  oz. Uterus, tubes and ovaries were all normal.  There were no other significant findings  Description of operation:  Patient was taken to the operating room and placed in the sitting position where she underwent a spinal anesthetic. She was then placed in the supine position with tilt to the left side. When adequate anesthetic level was obtained she was prepped and draped in usual sterile fashion and a Foley catheter was placed. A Pfannenstiel skin incision was made and carried down sharply to the rectus fascia which was scored in the midline extended laterally. The fascia was taken off the muscles both superiorly and without difficulty. The muscles were divided.  The peritoneal cavity was entered.  Bladder blade was placed, no bladder flap was  created.  A low transverse hysterotomy incision was made and delivered a viable female  infant at 59 with Apgars of 9 and 9 weighing pending lbs  oz.  Cord pH was obtained and was pending. The uterus was exteriorized. It was closed in 2 layers, the first being a running interlocking layer and the second being an imbricating layer using 0 monocryl on a CTX needle.  Additional interrupted 0 Monocryl sutures were placed for hemostasis. There was good resulting hemostasis. The uterus tubes and ovaries were all normal. Peritoneal cavity was irrigated vigorously. The muscles and peritoneum were reapproximated loosely. The fascia was closed using 0 Vicryl in running fashion. Subcutaneous tissue was made hemostatic and irrigated.  266 mg of exparel total volume of 40 cc was injected into the subcutaneous tissue for postoperative pain management.  The skin was closed using 4-0 Vicryl on a Keith needle in a subcuticular fashion.  Dermabond was placed for additional wound integrity and to serve as a barrier. Blood loss for the procedure was 750 cc. The patient received 2 grams of Ancef prophylactically. The patient was taken to the recovery room in good stable condition with all counts being correct x3.  EBL 750 cc  [name] H 04/20/2017 11:12 AM

## 2017-04-20 NOTE — H&P (Signed)
[name] is a 36 y.o. female [redacted]w[redacted]d Estimated Date of Delivery: 04/28/17 presenting for labor Cervix is 4-5 cm amd having regular contractions Known to be breech presentation s/p failed ECV attempt by Dr Debroah Loop, who was counseled and desires primary Caesarean section. Patient aware of indications risk and benefits and will proceed OB History    Gravida Para Term Preterm AB Living   0 3 2   SAB TAB Ectopic Multiple Live Births   Past Medical History:  Diagnosis Date  . 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 Hepatitis C    2014  . Herniated disc   . 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 Shoulder dislocation, recurrent    Past Surgical History:  Procedure Laterality Date  . NO PAST SURGERIES     Family History: family history includes Alcohol abuse in her father and mother; Arthritis in her father; COPD in her mother; Diabetes in her father; Drug abuse in her father and mother; Hypertension in her father and mother. Social History:  reports that she has quit smoking. She has never used smokeless tobacco. She reports that she uses drugs, including Heroin, IV, and Other-see comments. She reports that she does not drink alcohol.     Maternal Diabetes: No Genetic Screening: not done Maternal Ultrasounds/Referrals: Normal Fetal Ultrasounds or other Referrals:  None Maternal Substance Use:  Yes:  Type: Methadone Significant Maternal Medications:  None Significant Maternal Lab Results:  None Other Comments:  +Hepatitis C  ROS   Review of Systems  Constitutional: Negative for fever, chills, weight loss, malaise/fatigue and diaphoresis.  HENT: Negative for hearing loss, ear pain, nosebleeds, congestion, sore throat, neck pain, tinnitus and ear discharge.   Eyes: Negative for blurred vision, double vision, photophobia, pain, discharge and redness.  Respiratory: Negative for cough, hemoptysis, sputum production, shortness of  breath, wheezing and stridor.   Cardiovascular: Negative for chest pain, palpitations, orthopnea, claudication, leg swelling and PND.  Gastrointestinal: positive for abdominal pain. Negative for heartburn, nausea, vomiting, diarrhea, constipation, blood in stool and melena.  Genitourinary: Negative for dysuria, urgency, frequency, hematuria and flank pain.  Musculoskeletal: Negative for myalgias, back pain, joint pain and falls.  Skin: Negative for itching and rash.  Neurological: Negative for dizziness, tingling, tremors, sensory change, speech change, focal weakness, seizures, loss of consciousness, weakness and headaches.  Endo/Heme/Allergies: Negative for environmental allergies and polydipsia. Does not bruise/bleed easily.  Psychiatric/Behavioral: Negative for depression, suicidal ideas, hallucinations, memory loss and substance abuse. The patient is not nervous/anxious and does not have insomnia.      History  Past Medical History:  Diagnosis Date  . 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 Hepatitis C    2014  . Herniated disc   . 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 Shoulder dislocation, recurrent     Past Surgical History:  Procedure Laterality Date  . NO PAST SURGERIES      OB History    Gravida Para Term Preterm AB Living   0 3 2   SAB TAB Ectopic Multiple Live Births   No Known Allergies  Social History   Social History  . Marital status: Single    Spouse name: N/A  . Number of children: N/A  . Years of education: N/A   Social History  Main Topics  . Smoking status: Former Games developer  . Smokeless tobacco: Never Used  . Alcohol use No  . Drug use: Yes    Types: Heroin, IV, Other-see comments     Comment:  mg of methadone  . Sexual activity: Yes    Birth control/ protection: None, Coitus interruptus   Other Topics Concern  . None   Social History Narrative  . None    Family History  Problem  Relation Age of Onset  . COPD Mother   . Drug abuse Mother   . Alcohol abuse Mother   . Hypertension Mother   . Arthritis Father   . Diabetes Father   . Alcohol abuse Father   . Drug abuse Father   . Hypertension Father     Dilation: 4.5 Effacement (%): 70 Exam by:: n druebbisch rn Blood pressure (!) 150/94, pulse 80, temperature 97.8 F (36.6 C), temperature source Oral, resp. rate 18, height  (1.778 m), weight 202 lb (91.6 kg), last menstrual period 07/22/2016. Exam Physical Exam  Physical Exam  Vitals reviewed. Constitutional: She is oriented to person, place, and time. She appears well-developed and well-nourished.  HENT:  Head: Normocephalic and atraumatic.  Right Ear: External ear normal.  Left Ear: External ear normal.  Nose: Nose normal.  Mouth/Throat: Oropharynx is clear and moist.  Eyes: Conjunctivae and EOM are normal. Pupils are equal, round, and reactive to light. Right eye exhibits no discharge. Left eye exhibits no discharge. No scleral icterus.  Neck: Normal range of motion. Neck supple. No tracheal deviation present. No thyromegaly present.  Cardiovascular: Normal rate, regular rhythm, normal heart sounds and intact distal pulses.  Exam reveals no gallop and no friction rub.   No murmur heard. Respiratory: Effort normal and breath sounds normal. No respiratory distress. She has no wheezes. She has no rales. She exhibits no tenderness.  GI: Soft. Bowel sounds are normal. She exhibits no distension and no mass. There is tenderness. There is no rebound and no guarding.  Genitourinary:       Vulva is normal without lesions Vagina is pink moist without discharge Cervix normal in appearance and pap is normal Uterus is size equals dates Adnexa is negative with normal sized ovaries by sonogram  Musculoskeletal: Normal range of motion. She exhibits no edema and no tenderness.  Neurological: She is alert and oriented to person, place, and time. She has normal  reflexes. She displays normal reflexes. No cranial nerve deficit. She exhibits normal muscle tone. Coordination normal.  Skin: Skin is warm and dry. No rash noted. No erythema. No pallor.  Psychiatric: She has a normal mood and affect. Her behavior is normal. Judgment and thought content normal.   \ Prenatal labs: ABO, Rh: O/Positive/-- (03/21 1427) Antibody: Negative (03/21 1427) Rubella: @ RPR: Non Reactive (07/18 0948)  HBsAg: Negative (03/21 1427)  ZOX:[id number]    GBS: Negative (09/18 1501)   Assessment/Plan: [redacted]w[redacted]d Estimated Date of Delivery: 04/28/17  Breech presentation, S/P failed ECV attempt, declines trial of labor Methadone maintainence therapy  +Hepatitis C virus with significant viral load and mini ally elevated LFT, will check in am along with post op CBC  EURE,LUTHER H 04/20/2017, 9:20 AM

## 2017-04-20 NOTE — Anesthesia Procedure Notes (Signed)
Spinal  Patient location during procedure: OR Start time: 04/20/2017 10:06 AM End time: 04/20/2017 10:12 AM Staffing Anesthesiologist: Petrita Blunck Preanesthetic Checklist Completed: patient identified, site marked, surgical consent, pre-op evaluation, timeout performed, IV checked, risks and benefits discussed and monitors and equipment checked Spinal Block Patient position: sitting Prep: DuraPrep Patient monitoring: heart rate, cardiac monitor, continuous pulse ox and blood pressure Approach: midline Location: L3-4 Injection technique: single-shot Needle Needle type: Sprotte  Needle gauge: 24 G Needle length: 9 cm Assessment Sensory level: T4

## 2017-04-20 NOTE — Transfer of Care (Signed)
Immediate Anesthesia Transfer of Care Note  Patient: [name]  Procedure(s) Performed: CESAREAN SECTION (N/A )  Patient Location: PACU  Anesthesia Type:Spinal  Level of Consciousness: awake and alert   Airway & Oxygen Therapy: Patient Spontanous Breathing  Post-op Assessment: Report given to RN and Post -op Vital signs reviewed and stable  Post vital signs: Reviewed  Last Vitals:  Vitals:   04/20/17 0824 04/20/17 0905  BP: 135/79 (!) 150/94  Pulse: 92 80  Resp: 18   Temp: 36.6 C     Last Pain:  Vitals:   04/20/17 0905  TempSrc:   PainSc: 0-No pain         Complications: No apparent anesthesia complications

## 2017-04-20 NOTE — MAU Note (Signed)
Pt contracting, scheduled for c/s tomorrow.  Baby is breech

## 2017-04-21 LAB — COMPREHENSIVE METABOLIC PANEL
ALT: 24 U/L (ref 14–54)
ANION GAP: 6 (ref 5–15)
AST: 59 U/L — ABNORMAL HIGH (ref 15–41)
Albumin: 2.4 g/dL — ABNORMAL LOW (ref 3.5–5.0)
Alkaline Phosphatase: 84 U/L (ref 38–126)
BUN: 8 mg/dL (ref 6–20)
CHLORIDE: 107 mmol/L (ref 101–111)
CO2: 24 mmol/L (ref 22–32)
CREATININE: 0.58 mg/dL (ref 0.44–1.00)
Calcium: 8.4 mg/dL — ABNORMAL LOW (ref 8.9–10.3)
Glucose, Bld: 95 mg/dL (ref 65–99)
Potassium: 4.1 mmol/L (ref 3.5–5.1)
Sodium: 137 mmol/L (ref 135–145)
Total Bilirubin: 0.4 mg/dL (ref 0.3–1.2)
Total Protein: 5.3 g/dL — ABNORMAL LOW (ref 6.5–8.1)

## 2017-04-21 LAB — RPR: RPR: NONREACTIVE

## 2017-04-21 LAB — CBC
HCT: 26.1 % — ABNORMAL LOW (ref 36.0–46.0)
Hemoglobin: 9.3 g/dL — ABNORMAL LOW (ref 12.0–15.0)
MCH: 31.8 pg (ref 26.0–34.0)
MCHC: 35.6 g/dL (ref 30.0–36.0)
MCV: 89.4 fL (ref 78.0–100.0)
PLATELETS: 186 10*3/uL (ref 150–400)
RBC: 2.92 MIL/uL — ABNORMAL LOW (ref 3.87–5.11)
RDW: 13.5 % (ref 11.5–15.5)
WBC: 8.9 10*3/uL (ref 4.0–10.5)

## 2017-04-21 MED ORDER — KETOROLAC TROMETHAMINE 30 MG/ML IJ SOLN
30.0000 mg | Freq: Four times a day (QID) | INTRAMUSCULAR | Status: DC
  Administered 2017-04-21 (×3): 30 mg via INTRAMUSCULAR
  Filled 2017-04-21 (×2): qty 1

## 2017-04-21 MED ORDER — KETOROLAC TROMETHAMINE 30 MG/ML IJ SOLN
30.0000 mg | Freq: Four times a day (QID) | INTRAMUSCULAR | Status: DC
  Filled 2017-04-21: qty 1

## 2017-04-21 MED ORDER — IBUPROFEN 600 MG PO TABS
600.0000 mg | ORAL_TABLET | Freq: Four times a day (QID) | ORAL | Status: DC | PRN
  Administered 2017-04-22 (×3): 600 mg via ORAL
  Filled 2017-04-21 (×3): qty 1

## 2017-04-21 NOTE — Progress Notes (Signed)
Post Partum Day 1  Subjective:  [name] is a 36 y.o. [id number] [redacted]w[redacted]d s/p C-section.  No acute events overnight.  Pt denies problems with ambulating, voiding or po intake.  She denies nausea or vomiting.  Pain is moderately controlled.  She has had flatus.  Lochia Minimal.  Plan for birth control is oral contraceptives (estrogen/progesterone).  Method of Feeding: Breast  Objective: BP 121/68 (BP Location: Left Arm)   Pulse 82   Temp 98.1 F (36.7 C) (Oral)   Resp 18   Ht  (1.778 m)   Wt 91.6 kg (202 lb)   LMP 07/22/2016   SpO2 98%   BMI 28.98 kg/m   Physical Exam:  General: alert, cooperative and no distress Chest: normal respiratory effort Abdomen: C-section incision DVT Evaluation: No evidence of DVT seen on physical exam. Extremities: no lower extremity edema   Recent Labs  04/20/17 0900 04/21/17 0546  HGB 11.4* 9.3*  HCT 32.4* 26.1*    Assessment/Plan:  ASSESSMENT: [name] is a 36 y.o. [id number] [redacted]w[redacted]d ppd #1 s/p C-section doing well.   Breastfeeding, discharge pending appropriate post-operative recovery.    LOS: 1 day   Josephine Igo 04/21/2017, 10:51 AM   I confirm that I have verified the information documented in the student's note and that I have also personally reperformed the physical exam and all medical decision making activities.   Luna Kitchens CNM

## 2017-04-21 NOTE — Anesthesia Postprocedure Evaluation (Signed)
Anesthesia Post Note  Patient: MALEEKA SABATINO  Procedure(s) Performed: CESAREAN SECTION (N/A )     Patient location during evaluation: PACU Anesthesia Type: Spinal Level of consciousness: oriented and awake and alert Pain management: pain level controlled Vital Signs Assessment: post-procedure vital signs reviewed and stable Respiratory status: spontaneous breathing, respiratory function stable and patient connected to nasal cannula oxygen Cardiovascular status: blood pressure returned to baseline and stable Postop Assessment: no headache, no backache and no apparent nausea or vomiting Anesthetic complications: no    Last Vitals:  Vitals:   04/20/17 1930 04/21/17 0030  BP: 128/70 119/66  Pulse: 91 84  Resp: 18 18  Temp: 36.8 C 36.8 C  SpO2: 97% 96%    Last Pain:  Vitals:   04/21/17 0245  TempSrc:   PainSc: 6    Pain Goal: Patients Stated Pain Goal: 3 (04/20/17 1518)               Shawon Denzer

## 2017-04-21 NOTE — Anesthesia Postprocedure Evaluation (Signed)
Anesthesia Post Note  Patient: [name]  Procedure(s) Performed: CESAREAN SECTION (N/A )     Patient location during evaluation: Mother Baby Anesthesia Type: Spinal Level of consciousness: awake and alert and oriented Pain management: pain level controlled Vital Signs Assessment: post-procedure vital signs reviewed and stable Respiratory status: spontaneous breathing and nonlabored ventilation Cardiovascular status: stable Postop Assessment: no headache, patient able to bend at knees, no backache, no apparent nausea or vomiting, spinal receding and adequate PO intake Anesthetic complications: no    Last Vitals:  Vitals:   04/21/17 0030 04/21/17 0520  BP: 119/66 121/68  Pulse: 84 82  Resp: 18 18  Temp: 36.8 C 36.7 C  SpO2: 96% 98%    Last Pain:  Vitals:   04/21/17 0615  TempSrc:   PainSc: 7    Pain Goal: Patients Stated Pain Goal: 3 (04/20/17 1518)               Laban Emperor

## 2017-04-21 NOTE — Addendum Note (Signed)
Addendum  created 04/21/17 0751 by Elgie Congo, CRNA   Sign clinical note

## 2017-04-21 NOTE — Progress Notes (Signed)
This morning, this RN called attending faculty practice this am to clarify if MD wanted Toradol or motrin given as the scheduled pain medication ATC.  MD would like Toradol and Neurontin given ATC - not motrin.  See MD orders.

## 2017-04-22 MED ORDER — FERROUS SULFATE 325 (65 FE) MG PO TABS
325.0000 mg | ORAL_TABLET | Freq: Two times a day (BID) | ORAL | Status: DC
  Administered 2017-04-22 – 2017-04-23 (×3): 325 mg via ORAL
  Filled 2017-04-22 (×3): qty 1

## 2017-04-22 NOTE — Clinical Social Work Maternal (Signed)
CLINICAL SOCIAL WORK MATERNAL/CHILD NOTE  Patient Details  Name: [name] MRN: [id number] Date of Birth: 1980/10/04  Date:  04/22/2017  Clinical Social Worker Initiating Note:  Laurey Arrow Date/Time: Initiated:  04/22/17/1254     Child's Name:  [name]   Biological Parents:  Mother, Father   Need for Interpreter:  None   Reason for Referral:  Current Substance Use/Substance Use During Pregnancy  (MOB currently taking Methadone.)   Address:  [address] [address]    Phone number:  [phone] (home)     Additional phone number:  Household Members/Support Persons (HM/SP):   Household Member/Support Person 1, Household Member/Support Person 2   HM/SP Name Relationship DOB or Age  HM/SP -1 Arletta Bale FOB 09/16/1952  HM/SP -2 G[place] daughter 03/13/2006  HM/SP -3        HM/SP -4        HM/SP -5        HM/SP -6        HM/SP -7        HM/SP -8          Natural Supports (not living in the home):  Immediate Family, Friends, Extended Family, Parent (MOB's second child Raymondo Saunders Glance. 03/30/19(lives with bio father)does not live in the home but CPS was not involved)   Professional Supports: Therapist, Case Manager/Social Worker (MOB goes to [employer] [facility] for medication management and counseling. )   Employment: Unemployed   Type of Work:     Education:  [place] arranged:    Museum/gallery curator Resources:  [employer]   Other Resources:  [employer], Physicist, medical    Cultural/Religious Considerations Which May Impact Care:  Per [employer], MOB is [employer].  Strengths:  Ability to meet basic needs , Psychotropic Medications, Home prepared for child , Pediatrician chosen   Psychotropic Medications:  Methadone      Pediatrician:    [facility] area  Pediatrician List:   [facility] [place]       Pediatrician Fax Number:    Risk Factors/Current Problems:  Substance Use    Cognitive State:  Able to Concentrate , Alert , Linear Thinking , Insightful , Goal Oriented    Mood/Affect:  Happy , Relaxed , Comfortable , Interested    CSW Assessment: CSW met with to complete an assessment for hx of SA.  When CSW arrived, MOB was resting in bed engaging in skin to skin with infant.  FOB was relaxing on the couch.  MOB gave CSW permission to meet with MOB while FOB was present. MOB and FOB was polite, easy to engage and was receptive to meeting with CSW.    CSW inquired about MOB's SA hx and MOB reported being prescribed Methadone for the past 2 years.  MOB proudly reported her sobriety (MOB has been clean for 2 years). CSW praised MOB and encouraged MOB to continue her sobriety journey. CSW explained hospital SA policy and MOB and FOB were understanding.  CSW made MOB aware that infant's UDS was negative and CSW will continue to monitor infant's CDS.  MOB was informed that CSW will make a report to CPS if CDS is positive without an explanation.   CSW provided education regarding the baby blues period vs. perinatal mood disorders, discussed treatment and gave resources for mental health  follow up if concerns arise.  CSW recommends self-evaluation during the postpartum time period using the New Mom Checklist from Postpartum Progress and encouraged MOB to contact a medical professional if symptoms are noted at any time.    CSW provided review of Sudden Infant Death Syndrome (SIDS) precautions.    CSW identifies no further need for intervention and no barriers to discharge at this time.   CSW Plan/Description:  No Further Intervention Required/No Barriers to Discharge, Sudden Infant Death Syndrome (SIDS) Education, Neonatal Abstinence Syndrome (NAS) Education, Other Information/Referral to [employer], Perinatal Mood and Anxiety Disorder (PMADs) Education, [place], MSW, LCSW Clinical Social Work [phone]  Dimple Nanas, LCSW 04/22/2017, 3:59 PM

## 2017-04-22 NOTE — Plan of Care (Signed)
Problem: Pain Management: Goal: General experience of comfort will improve and pain level will decrease Outcome: Progressing Switched from IM Toradol to po Motrin  Problem: Bowel/Gastric: Goal: Gastrointestinal status will improve Outcome: Completed/Met Date Met: 04/22/17 Normal BM this am

## 2017-04-22 NOTE — Lactation Note (Addendum)
This note was copied from a baby's chart. Lactation Consultation Note  Patient Name: [name] ZOXWR'U Date: 04/22/2017    P3,  Mother on methadone.  Really wants to breastfeed. Mother had wide spaced breasts that per mom have been leaking since 18 weeks. Mother states she did have minimal increase in size during pregnancy. Mother has been spoon/yringe feeding and baby has been sleepy. Assisted with latching baby in cross cradle and she latched for 5 min and fell back asleep. Set up DEBP.  Instructed mother to pump q 3hours for 10-20 min unless baby is latching.  If baby is latching, post pump 4-6 times per day. Give volume pumped back at next feeding. Reviewed cleaning and milk storage, syringe and spoon feeding. Reviewed waking techniques and feeding STS.   Maternal Data    Feeding Feeding Type: Breast Fed (formula syringe at breast) Length of feed: 15 min  LATCH Score Latch: Repeated attempts needed to sustain latch, nipple held in mouth throughout feeding, stimulation needed to elicit sucking reflex. (much stimulation)  Audible Swallowing: A few with stimulation  Type of Nipple: Everted at rest and after stimulation  Comfort (Breast/Nipple): Soft / non-tender  Hold (Positioning): Full assist, staff holds infant at breast  LATCH Score: 6  Interventions    Lactation Tools Discussed/Used Tools: Nipple Dorris Carnes (see porgressnotes) Nipple shield size: 24   Consult Status      Dahlia Byes Boschen 04/22/2017, 7:34 AM

## 2017-04-22 NOTE — Progress Notes (Signed)
CLINICAL SOCIAL WORK MATERNAL/CHILD NOTE  Patient Details  Name: [name] MRN: [id number] Date of Birth: 1980/10/04  Date:  04/22/2017  Clinical Social Worker Initiating Note:  Laurey Arrow Date/Time: Initiated:  04/22/17/1254     Child's Name:  [name]   Biological Parents:  Mother, Father   Need for Interpreter:  None   Reason for Referral:  Current Substance Use/Substance Use During Pregnancy  (MOB currently taking Methadone.)   Address:  [place] [address]    Phone number:  [phone] (home)     Additional phone number:  Household Members/Support Persons (HM/SP):   Household Member/Support Person 1, Household Member/Support Person 2   HM/SP Name Relationship DOB or Age  HM/SP -1 Arletta Bale FOB 09/16/1952  HM/SP -2 [place] daughter 03/13/2006  HM/SP -3        HM/SP -4        HM/SP -5        HM/SP -6        HM/SP -7        HM/SP -8          Natural Supports (not living in the home):  Immediate Family, Friends, Extended Family, Parent (MOB's second child Raymondo Saunders Glance. 03/30/19(lives with bio father)does not live in the home but CPS was not involved)   Professional Supports: Therapist, Case Manager/Social Worker (MOB goes to [employer] for medication management and counseling. )   Employment: Unemployed   Type of Work:     Education:  [place] arranged:    Museum/gallery curator Resources:  [employer]   Other Resources:  [employer], Physicist, medical    Cultural/Religious Considerations Which May Impact Care:  Per [employer], MOB is [employer].  Strengths:  Ability to meet basic needs , Psychotropic Medications, Home prepared for child , Pediatrician chosen   Psychotropic Medications:  Methadone      Pediatrician:    [facility] area  Pediatrician List:   [facility] [place]       Pediatrician Fax Number:    Risk Factors/Current Problems:  Substance Use    Cognitive State:  Able to Concentrate , Alert , Linear Thinking , Insightful , Goal Oriented    Mood/Affect:  Happy , Relaxed , Comfortable , Interested    CSW Assessment: CSW met with to complete an assessment for hx of SA.  When CSW arrived, MOB was resting in bed engaging in skin to skin with infant.  FOB was relaxing on the couch.  MOB gave CSW permission to meet with MOB while FOB was present. MOB and FOB was polite, easy to engage and was receptive to meeting with CSW.    CSW inquired about MOB's SA hx and MOB reported being prescribed Methadone for the past 2 years.  MOB proudly reported her sobriety (MOB has been clean for 2 years). CSW praised MOB and encouraged MOB to continue her sobriety journey. CSW explained hospital SA policy and MOB and FOB were understanding.  CSW made MOB aware that infant's UDS was negative and CSW will continue to monitor infant's CDS.  MOB was informed that CSW will make a report to CPS if CDS is positive without an explanation.   CSW provided education regarding the baby blues period vs. perinatal mood disorders, discussed treatment and gave resources for mental health  follow up if concerns arise.  CSW recommends self-evaluation during the postpartum time period using the New Mom Checklist from Postpartum Progress and encouraged MOB to contact a medical professional if symptoms are noted at any time.    CSW provided review of Sudden Infant Death Syndrome (SIDS) precautions.    CSW identifies no further need for intervention and no barriers to discharge at this time.   CSW Plan/Description:  No Further Intervention Required/No Barriers to Discharge, Sudden Infant Death Syndrome (SIDS) Education, Neonatal Abstinence Syndrome (NAS) Education, Other Information/Referral to [employer], Perinatal Mood and Anxiety Disorder (PMADs) Education, [place], MSW, LCSW Clinical Social Work [phone]  Dimple Nanas, LCSW 04/22/2017, 3:59 PM

## 2017-04-22 NOTE — Progress Notes (Signed)
POSTPARTUM PROGRESS NOTE  Post Partum Day 2  Subjective:  [name] is a 36 y.o. [phone] s/p LTCS for breech at [redacted]w[redacted]d.  No acute events overnight.  Pt denies problems with ambulating, voiding or po intake.  She denies nausea or vomiting.  Pain is well controlled.  She has had flatus.   Lochia like menses  Objective: Blood pressure 130/73, pulse 85, temperature 98.4 F (36.9 C), temperature source Oral, resp. rate 18, height  (1.778 m), weight 202 lb (91.6 kg), last menstrual period 07/22/2016, SpO2 98 %.  Physical Exam:  General: alert, cooperative and no distress Chest: no respiratory distress Heart:regular rate, distal pulses intact Abdomen: soft, nontender,  Uterine Fundus: firm, appropriately tender DVT Evaluation: No calf swelling or tenderness Extremities: no edema Skin: warm, dry; incision with honeycomb in place   Recent Labs  04/20/17 0900 04/21/17 0546  HGB 11.4* 9.3*  HCT 32.4* 26.1*    Assessment/Plan: [name] is a 36 y.o. [id number] s/p LTCS for breech presentaion at [redacted]w[redacted]d. 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  POD#2 - Doing well Contraception: OCPs Feeding: breast Dispo: Plan for discharge tomorrow.   LOS: 2 days   Kandra Nicolas DegeleMD 04/22/2017, 7:12 AM

## 2017-04-22 NOTE — Lactation Note (Addendum)
This note was copied from a baby's chart. Lactation Consultation Note  Patient Name: [name] Date: 04/22/2017 Reason for consult: Follow-up assessment   Baby 46 hours old and breastfeeding upon entering the room with a #20NS. Mother trying to forcefully keep baby at breast.  Parents holding both of infant's arms back and baby seems frustrated. Encouraged mother to hold infant's head at the base and gently move infant's hand out of the way. Noted baby to be a thumb sucker. Oral assessment indicated baby has good suck, no biting or pinching. Mother has wide spaced breasts.  Mother has not been pumping regularly. Mother's nipples are erect.  Did not notice a difference with sucking pattern by using NS and NS seems more cumbersome for mother. Applied 5 french tubing to side of infant's mouth while she is at the breast. Suggest taping tubing after inserting into infant's mouth.  Baby received 12 ml. Reviewed volume guidelines increasing per day of life. Mother falling asleep during consult. Suggest mother rest after feeding and pump with DEBP when she wakes. Give volume back to baby at next feeding with the difference with formula. Discussed breastfeeding and bottle feeding after with formula since mother seems frustrated but mother declines.     Maternal Data    Feeding Feeding Type: Breast Fed Length of feed: 20 min  LATCH Score Latch: Repeated attempts needed to sustain latch, nipple held in mouth throughout feeding, stimulation needed to elicit sucking reflex.  Audible Swallowing: A few with stimulation  Type of Nipple: Everted at rest and after stimulation  Comfort (Breast/Nipple): Soft / non-tender  Hold (Positioning): Assistance needed to correctly position infant at breast and maintain latch.  LATCH Score: 7  Interventions Interventions: Breast feeding basics reviewed;Assisted with latch;Breast compression;Support pillows;Position  options;DEBP  Lactation Tools Discussed/Used Tools: Supplemental Nutrition System   Consult Status Consult Status: Follow-up Date: 04/23/17 Follow-up type: In-patient    Dahlia Byes [facility] 04/22/2017, 9:15 AM

## 2017-04-23 ENCOUNTER — Encounter (HOSPITAL_COMMUNITY): Payer: Self-pay | Admitting: *Deleted

## 2017-04-23 ENCOUNTER — Ambulatory Visit: Payer: Self-pay

## 2017-04-23 MED ORDER — FERROUS SULFATE 325 (65 FE) MG PO TABS: 325.0000 mg | ORAL_TABLET | Freq: Two times a day (BID) | ORAL | 3 refills | Status: DC

## 2017-04-23 MED ORDER — MEASLES, MUMPS & RUBELLA VAC ~~LOC~~ INJ
0.5000 mL | INJECTION | Freq: Once | SUBCUTANEOUS | Status: AC
  Administered 2017-04-23: 0.5 mL via SUBCUTANEOUS
  Filled 2017-04-23: qty 0.5

## 2017-04-23 MED ORDER — IBUPROFEN 800 MG PO TABS: 600.0000 mg | ORAL_TABLET | Freq: Three times a day (TID) | ORAL | 3 refills | Status: DC | PRN

## 2017-04-23 NOTE — Discharge Instructions (Signed)

## 2017-04-23 NOTE — Lactation Note (Signed)
This note was copied from a baby's chart. Lactation Consultation Note  Patient Name: [name] UEAVW'U Date: 04/23/2017 Baby at 77 hr of life. Upon entry mom and baby were sleeping. Mom woke, looked at lactation, and asked for a consult "later".    Rulon Eisenmenger 04/23/2017, 3:55 PM

## 2017-04-23 NOTE — Lactation Note (Addendum)
This note was copied from a baby's chart. Lactation Consultation Note CN RN asked LC to see mom. Mom requested LC as well. Baby isn't very active at breast. Will not open wide for deep latch. Mom has cone shaped breast w/everted nipples. Mom has wide spaced breast w/tubular breast w/limited breast tissue around approx. Size of lemon. Hand expressed colostrum. Appears to be transitional milk. Appears white and thin coming out of breast, yellow in container.  Mom pumped 4 ml during the night. Baby needed much stimulation to feed. Mom states she has a hard time getting baby to feed. Mom leaking to Lt. Breast while baby BF on Rt. Breast. Mom pumped Lt. Breast. Obtaining about 3ml before LC left rm.  Fed baby formula 5 fr tubing at the breast. Baby appears satisfied after feeding.  Mom has lots of questions. Stresses importance of wanting to give baby BM d/t taking Methadone and wanting to give baby that to help w/withdrawls.  LC doesn't feel that mom needs NS at this time d/t everted nipple and shape of breast.  Baby does appear to may have posterior frenulum. Encouraged mom to massage breast at intervals during feedings.  Patient Name: [name] UEAVW'U Date: 04/23/2017 Reason for consult: Follow-up assessment   Maternal Data    Feeding Feeding Type: Formula Length of feed: 18 min  LATCH Score Latch: Repeated attempts needed to sustain latch, nipple held in mouth throughout feeding, stimulation needed to elicit sucking reflex.  Audible Swallowing: A few with stimulation  Type of Nipple: Everted at rest and after stimulation  Comfort (Breast/Nipple): Soft / non-tender  Hold (Positioning): Full assist, staff holds infant at breast  LATCH Score: 6  Interventions Interventions: Breast feeding basics reviewed;Breast compression;Assisted with latch;Adjust position;DEBP;Skin to skin;Support pillows;Breast massage;Position options;Hand express;Expressed milk  Lactation Tools  Discussed/Used Tools: Pump;22F feeding tube / Syringe Breast pump type: Double-Electric Breast Pump   Consult Status Consult Status: Follow-up Date: 04/24/17 Follow-up type: In-patient    Charyl Dancer 04/23/2017, 6:54 AM

## 2017-04-23 NOTE — Lactation Note (Signed)
This note was copied from a baby's chart. Lactation Consultation Note  Patient Name: [name] WJXBJ'Y Date: 04/23/2017   Baby at 80 hr of life. Mom told RN that she does not want to see lactation.    Rulon Eisenmenger 04/23/2017, 7:06 PM

## 2017-04-23 NOTE — Discharge Summary (Signed)
OB Discharge Summary     Patient Name: [name] DOB: 05/14/1981 MRN: [id number]  Date of admission: 04/20/2017 Delivering MD: Duane Lope H   Date of discharge: 04/23/2017  Admitting diagnosis: 39 WKS LABOR Intrauterine pregnancy: [redacted]w[redacted]d     Secondary diagnosis:  Principal Problem:   S/P cesarean section Active Problems:   Methadone maintenance treatment complicating pregnancy (HCC)   Hepatitis C, chronic, maternal, antepartum (HCC)   AMA (advanced maternal age) multigravida 35+   Rubella non-immune status, antepartum   Breech presentation  Additional problems: none     Discharge diagnosis: same                                                                                                Post partum procedures:none  Complications: None  Hospital course:  Sceduled C/S   36 y.o. yo [id number] at [redacted]w[redacted]d was admitted to the hospital 04/20/2017 for scheduled cesarean section with the following indication:Malpresentation.  Membrane Rupture Time/Date: 10:29 AM ,04/20/2017   Patient delivered a Viable infant.04/20/2017  Details of operation can be found in separate operative note.  Pateint had an uncomplicated postpartum course.  She is ambulating, tolerating a regular diet, passing flatus, and urinating well. Patient is discharged home in stable condition on  04/23/17         Physical exam  Vitals:   04/21/17 1835 04/22/17 0816 04/22/17 1734 04/23/17 0629  BP: 130/73 124/76 126/77 125/74  Pulse: 85 78 85 67  Resp: Temp: 98.4 F (36.9 C) 98 F (36.7 C) 98.3 F (36.8 C) 98.1 F (36.7 C)  TempSrc: Oral  Oral   SpO2:      Weight:      Height:       General: alert, cooperative and no distress Lochia: appropriate Uterine Fundus: firm Incision: Healing well with no significant drainage, No significant erythema, Dressing is clean, dry, and intact DVT Evaluation: No evidence of DVT seen on physical exam. Calf/Ankle edema is present Labs: Lab Results   Component Value Date   WBC 8.9 04/21/2017   HGB 9.3 (L) 04/21/2017   HCT 26.1 (L) 04/21/2017   MCV 89.4 04/21/2017   PLT 186 04/21/2017   CMP Latest Ref Rng & Units 04/21/2017  Glucose 65 - 99 mg/dL 95  BUN 6 - 20 mg/dL 8  Creatinine 1.47 - 8.29 mg/dL 5.62  Sodium 130 - 865 mmol/L 137  Potassium 3.5 - 5.1 mmol/L 4.1  Chloride 101 - 111 mmol/L 107  CO2 22 - 32 mmol/L 24  Calcium 8.9 - 10.3 mg/dL 7.8(I)  Total Protein 6.5 - 8.1 g/dL 5.3(L)  Total Bilirubin 0.3 - 1.2 mg/dL 0.4  Alkaline Phos 38 - 126 U/L 84  AST 15 - 41 U/L 59(H)  ALT 14 - 54 U/L 24    Discharge instruction: per After Visit Summary and "Baby and Me Booklet".  After visit meds:  Allergies as of 04/23/2017   No Known Allergies     Medication List    STOP taking these medications   cephALEXin 500 MG capsule Commonly known  as:  KEFLEX   ondansetron 4 MG tablet Commonly known as:  ZOFRAN   PNV PRENATAL PLUS MULTIVITAMIN 27-1 MG Tabs   terconazole 0.4 % vaginal cream Commonly known as:  TERAZOL 7     TAKE these medications   acetaminophen 325 MG tablet Commonly known as:  TYLENOL Take 650 mg by mouth every 6 (six) hours as needed (for pain.).   COLACE PO Take 3 tablets by mouth daily.   cyclobenzaprine 10 MG tablet Commonly known as:  FLEXERIL Take 1 tablet (10 mg total) by mouth 3 (three) times daily as needed for muscle spasms. What changed:  reasons to take this   ferrous sulfate 325 (65 FE) MG tablet Take 1 tablet (325 mg total) by mouth 2 (two) times daily with a meal.   FIBER ADULT GUMMIES PO Take 2 tablets by mouth daily.   ibuprofen 800 MG tablet Commonly known as:  ADVIL,MOTRIN Take 1 tablet (800 mg total) by mouth every 8 (eight) hours as needed for mild pain or moderate pain.   methadone 10 MG/ML solution Commonly known as:  DOLOPHINE Take 107 mg by mouth daily.   pantoprazole 20 MG tablet Commonly known as:  PROTONIX Take 1 tablet (20 mg total) by mouth daily. What  changed:  additional instructions       Diet: routine diet  Activity: Advance as tolerated. Pelvic rest for 6 weeks.   Outpatient follow up:2 weeks Follow up Appt:Future Appointments Date Time Provider Department Center  05/09/2017 8:45 AM Teague Edwena Blow, PA-C [facility]   Follow up Visit:No Follow-up on file.  Postpartum contraception: Combination OCPs  Newborn Data: Live born female  Birth Weight: 7 lb 2 oz (3231 g) APGAR: 9, 9  Newborn Delivery   Birth date/time:  04/20/2017 10:30:00 Delivery type:  C-Section, Low Transverse  C-section categorization:  Primary     Baby Feeding: Breast Disposition:rooming in   04/23/2017 Levie Heritage, DO

## 2017-04-24 ENCOUNTER — Encounter: Payer: Self-pay | Admitting: Obstetrics & Gynecology

## 2017-04-28 ENCOUNTER — Telehealth: Payer: Self-pay | Admitting: General Practice

## 2017-04-28 DIAGNOSIS — G8918 Other acute postprocedural pain: Secondary | ICD-10-CM

## 2017-04-28 MED ORDER — GABAPENTIN 300 MG PO CAPS: 300.0000 mg | ORAL_CAPSULE | Freq: Three times a day (TID) | ORAL | 1 refills | Status: DC | PRN

## 2017-04-28 NOTE — Telephone Encounter (Signed)
Spoke with Dr Adrian Blackwater who ordered gabapentin  TID prn disp 30 1 refill. Called patient back and informed her. Patient verbalized understanding & asked if she had appt set up for postpartum visit yet. Told patient she doesn't but I would let the front office staff know & they will contact her with an appt. Reminded patient of appt on 10/26. Patient verbalized understanding & had no questions

## 2017-04-28 NOTE — Telephone Encounter (Signed)
Patient called and left message stating she needs to talk to someone about a prescription not getting called in. Patient states Dr Adrian Blackwater said he was going to call a Rx in but it isn't at her pharmacy. Patient states she also has questions about her honeycomb dressing. Called patient, no answer- unable to leave message as voicemail box was full

## 2017-05-01 ENCOUNTER — Ambulatory Visit: Payer: Self-pay

## 2017-05-01 ENCOUNTER — Encounter: Payer: Self-pay | Admitting: Obstetrics & Gynecology

## 2017-05-01 NOTE — Lactation Note (Signed)
This note was copied from a baby's chart. Lactation Consultation Note  Patient Name: [name] WUJWJ'XToday's Date: 05/01/2017 Reason for consult: Follow-up assessment;NICU baby  NICU baby 4811 days old. Called to NICU to give mom #21 flanges. [facility] and enc her to ask for #21. Mom states that when she uses #24 flanges, most of her breast tissue is pulled into the neck of the flange. Enc mom to call for assistance if has questions about size of flange while pumping. Mom reports that she is collecting about an ounce each time that she pumps. Mom states that she is pumping about 6 times a day. Enc mom to pump every 2-3 hours for a total of at least 8 times/24 hours followed by hand expression. Mom reports that she just is not able to keep this schedule, so discussed with mom that this will impact her supply. However, enc mom to do the best she can with pumping, and that any amount of EBM baby gets is of benefit. Enc mom to hand express as well.   Maternal Data    Feeding Feeding Type: Formula Nipple Type: Slow - flow Length of feed: 30 min  LATCH Score                   Interventions    Lactation Tools Discussed/Used     Consult Status Consult Status: PRN    Sherlyn HayJennifer D Linlee Cromie 05/01/2017, 2:56 PM

## 2017-05-05 ENCOUNTER — Ambulatory Visit: Payer: Self-pay

## 2017-05-05 NOTE — Lactation Note (Signed)
This note was copied from a baby's chart. Lactation Consultation Note  Patient Name: Girl Lottie RaterSarah Matich ZOXWR'UToday's Date: 05/05/2017   Baby 112 weeks old and NICU requested LC come to NICU to answer mother's questions. [facility] mother stated she has to leave to pick up her other child. Did speak with mother briefly.  Mother is concerned about the volume she is pumping. Asked mother how often he is pumping and she states q 4-5 hours.  Recommend pumping q 2-3 hours. [facility] and suggest she call tonight to answer questions further.      Maternal Data    Feeding Feeding Type: Breast Milk with Formula added Nipple Type: Regular Length of feed: 30 min  LATCH Score                   Interventions    Lactation Tools Discussed/Used     Consult Status      Hardie PulleyBerkelhammer, Annette Liotta Boschen 05/05/2017, 3:42 PM

## 2017-05-08 ENCOUNTER — Encounter: Payer: Self-pay | Admitting: Obstetrics & Gynecology

## 2017-05-09 ENCOUNTER — Institutional Professional Consult (permissible substitution): Payer: Self-pay | Admitting: Physician Assistant

## 2017-05-26 ENCOUNTER — Ambulatory Visit: Payer: Self-pay | Admitting: Advanced Practice Midwife

## 2017-06-19 ENCOUNTER — Ambulatory Visit: Payer: Self-pay | Admitting: Obstetrics & Gynecology

## 2017-06-23 ENCOUNTER — Ambulatory Visit: Payer: Self-pay | Admitting: Obstetrics & Gynecology

## 2017-06-30 ENCOUNTER — Other Ambulatory Visit: Payer: Self-pay | Admitting: *Deleted

## 2017-06-30 MED ORDER — PANTOPRAZOLE SODIUM 20 MG PO TBEC: 20.0000 mg | DELAYED_RELEASE_TABLET | Freq: Every day | ORAL | 0 refills | Status: DC

## 2017-06-30 NOTE — Progress Notes (Signed)
Fax refill request for Protonix received.  Pt has no showed to several postpartum appts however is scheduled on 07/16/17.  Refill approved by Dr. Earlene Plateravis - e-prescribed.

## 2017-07-16 ENCOUNTER — Ambulatory Visit: Payer: Self-pay | Admitting: Student

## 2017-08-01 ENCOUNTER — Telehealth: Payer: Self-pay | Admitting: *Deleted

## 2017-08-01 NOTE — Telephone Encounter (Signed)
Pt left message yesterday stating that she had her baby on 04/20/17. She requested a call back from [place] ASAP regarding Hep C referral. Per chart review, pt has a scheduled appt in office on 2/1 to discuss birth control.

## 2017-08-06 ENCOUNTER — Other Ambulatory Visit: Payer: Self-pay | Admitting: General Practice

## 2017-08-06 DIAGNOSIS — B182 Chronic viral hepatitis C: Secondary | ICD-10-CM

## 2017-08-06 NOTE — Telephone Encounter (Signed)
Scheduled appt with [facility] for 1/31 @ 11:15am. Called and informed patient. Patient verbalized understanding and confirmed appt in our office for 2/1. Patient had no questions

## 2017-08-07 IMAGING — US US MFM OB DETAIL+14 WK
1 series · 13 of 28 positions shown · non-contrast
Comparison: none

OB/Gyn Clinic
Paulus N DO

1  TAT TIGER            [id number]      [id number]     [id number]
Indications
19 weeks gestation of pregnancy
Encounter for fetal anatomic survey
Heroin/Methadone use
Chronic Hepatitis C complicating pregnancy,
antepartum
Advanced maternal age multigravida 35+,
second trimester
OB History
Blood Type:            Height:  5'10"  Weight (lb):  178      BMI:
Gravidity:    6         Term:   2        Prem:   0        SAB:   1
TOP:          2       Ectopic:  0        Living: 2
Fetal Evaluation
Num Of Fetuses:     1
Fetal Heart         154
Rate(bpm):
Cardiac Activity:   Observed
Presentation:       Variable
Placenta:           Posterior, above cervical os
P. Cord Insertion:  Visualized, central
Amniotic Fluid
AFI FV:      Subjectively within normal limits
Largest Pocket(cm)
5.65
Biometry
BPD:      43.1  mm     G. Age:  19w 0d         40  %    CI:        76.52   %   70 - 86
FL/HC:      18.8   %   16.1 -
HC:      156.1  mm     G. Age:  18w 4d         13  %    HC/AC:      1.07       1.09 -
AC:      146.2  mm     G. Age:  19w 6d         66  %    FL/BPD:     68.0   %
FL:       29.3  mm     G. Age:  19w 0d         34  %    FL/AC:      20.0   %   20 - 24
HUM:      28.7  mm     G. Age:  19w 2d         51  %
CER:      18.5  mm     G. Age:  18w 2d         20  %
NFT:         2  mm
Est. FW:     290  gm    0 lb 10 oz      48  %
Gestational Age
LMP:           19w 2d       Date:   07/22/16                 EDD:   04/28/17
U/S Today:     19w 1d                                        EDD:   04/29/17
Best:          19w 2d    Det. By:   LMP  (07/22/16)          EDD:   04/28/17
Anatomy
Cranium:               Appears normal         LVOT:                   Appears normal
Cavum:                 Appears normal         Aortic Arch:            Appears normal
Ventricles:            Appears normal         Ductal Arch:            Appears normal
Choroid Plexus:        Appears normal         Diaphragm:              Appears normal
Cerebellum:            Appears normal         Stomach:                Appears normal, left
sided
Posterior Fossa:       Appears normal         Abdomen:                Appears normal
Nuchal Fold:           Appears normal         Abdominal Wall:         Appears nml (cord
insert, abd wall)
Face:                  Appears normal         Cord Vessels:           Appears normal (3
(orbits and profile)                           vessel cord)
Lips:                  Appears normal         Kidneys:                Appear normal
Palate:                Appears normal         Bladder:                Appears normal
Thoracic:              Appears normal         Spine:                  Not well visualized
Heart:                 Not well visualized    Upper Extremities:      Appears normal
RVOT:                  Not well visualized    Lower Extremities:      Appears normal
Other:  Fetus appears to be a female. PARENTS DO NOT WANT TO KNOW
GENDER.  Heels and 5th digit appears normal. Technically difficult
due to fetal position.
Cervix Uterus Adnexa
Cervix
Length:           3.79  cm.
Normal appearance by transabdominal scan.
Uterus
No abnormality visualized.
Left Ovary
Within normal limits.
Right Ovary
Cul De Sac:   No free fluid seen.
Adnexa:       No abnormality visualized.
Impression
INDICATION: 35 yr old 5C2C4QC at 39w1d with history of drug
abuse now on methadone maintenance and hepatitis C for
fetal anatomic survey.

[Series 1: us mfm ob detail+14 wk · 119 acquisitions, 13 frames shown]
[im 5/119]
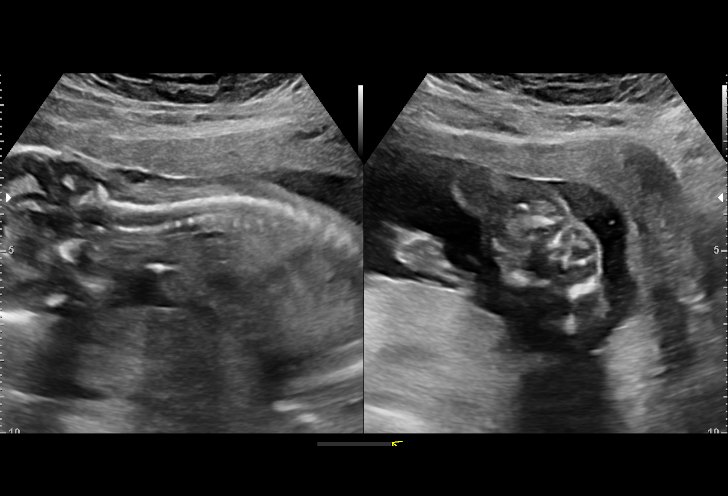
[im 14/119]
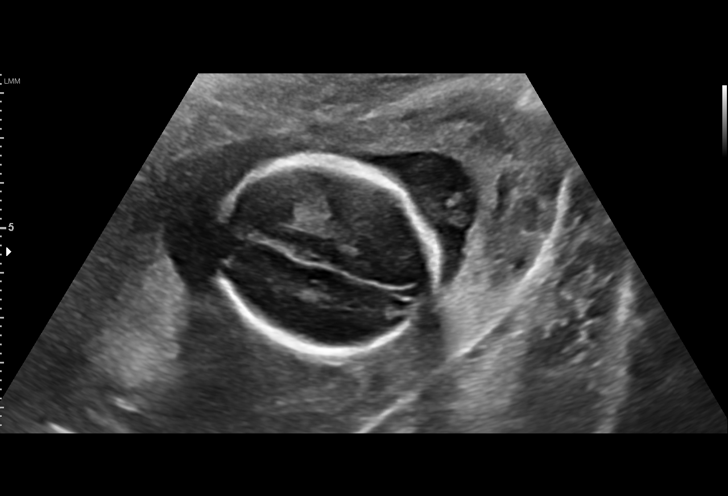
[im 22/119]
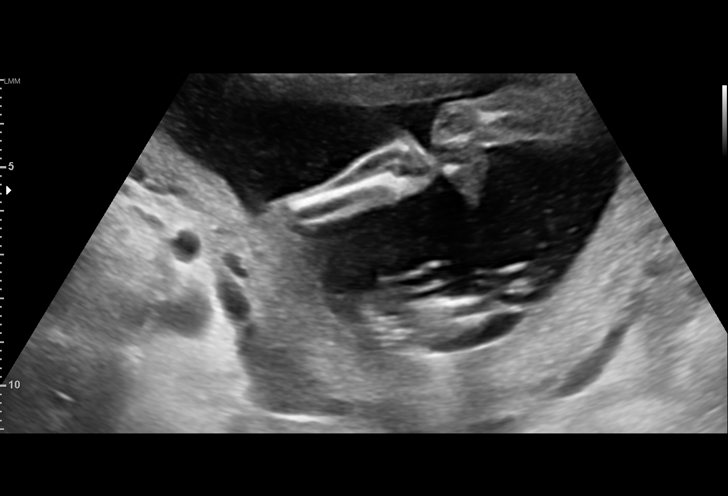
[im 31/119]
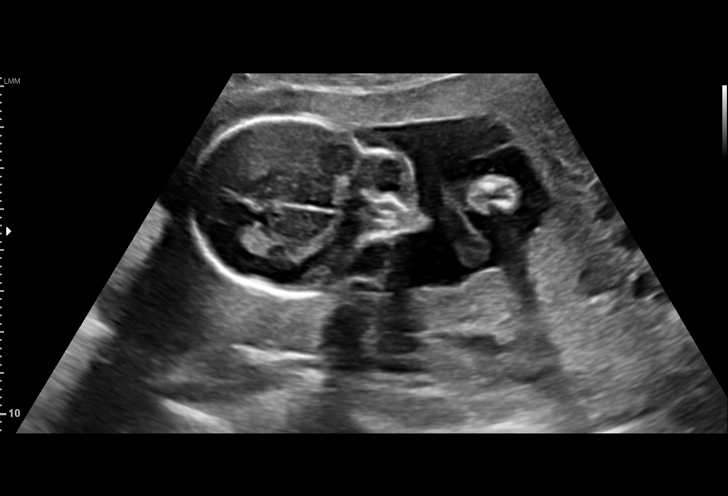
[im 40/119]
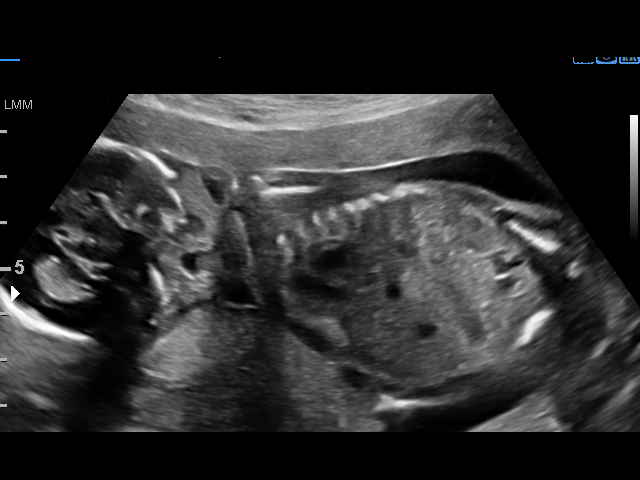
[im 49/119]
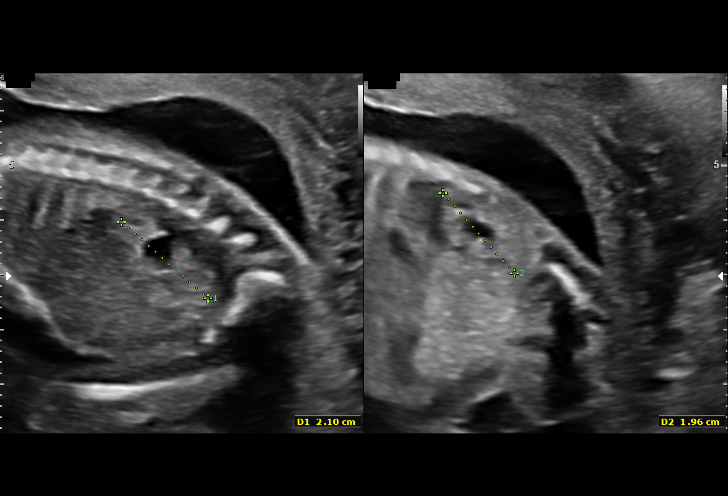
[im 62/119]
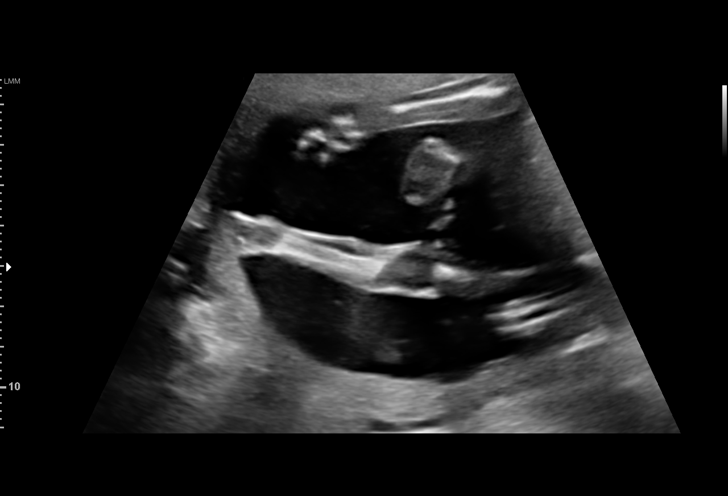
[im 70/119]
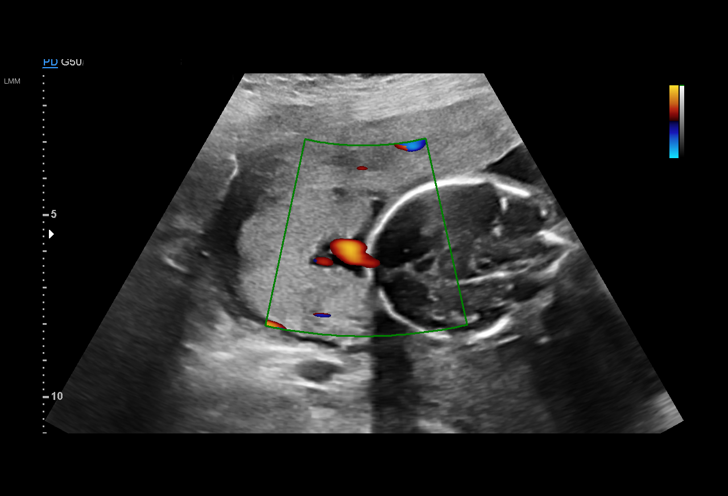
[im 79/119]
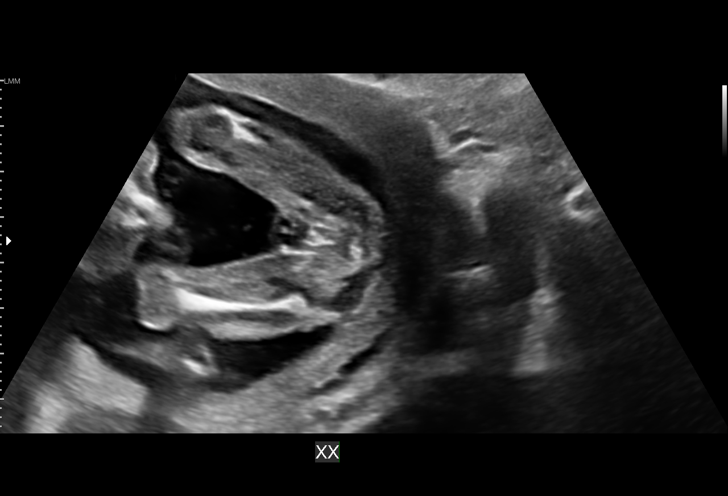
[im 88/119]
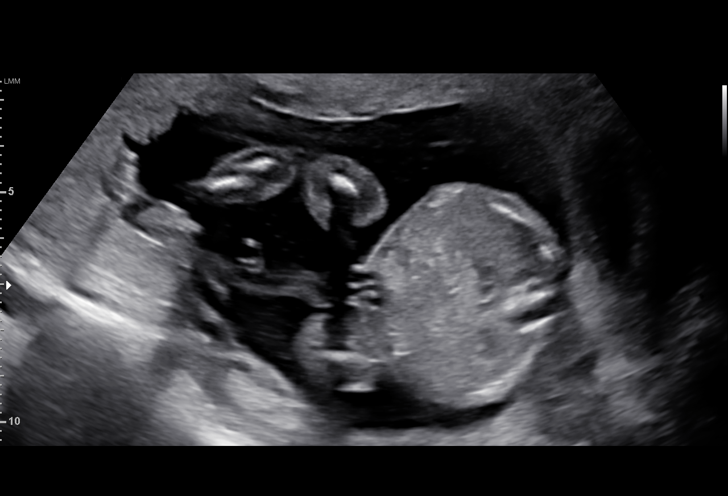
[im 97/119]
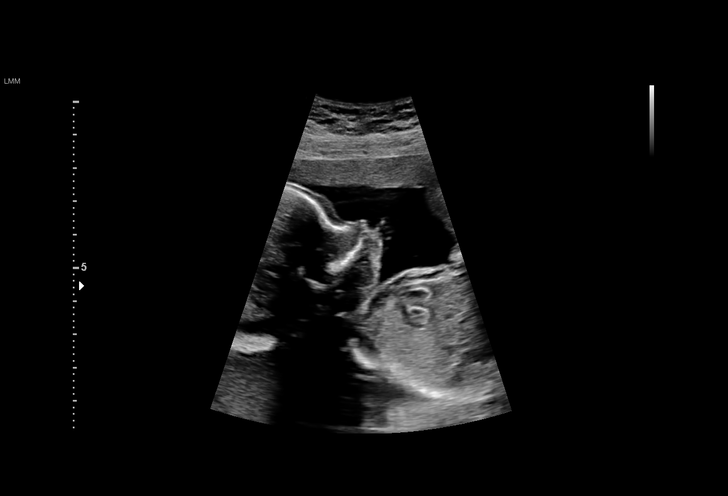
[im 105/119]
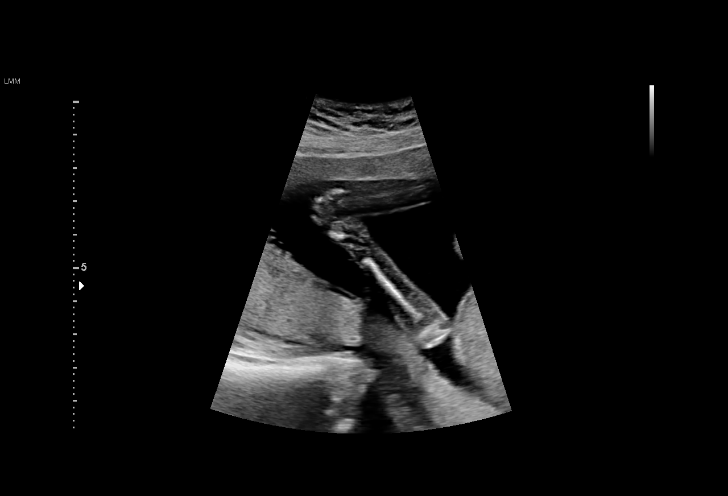
[im 114/119]
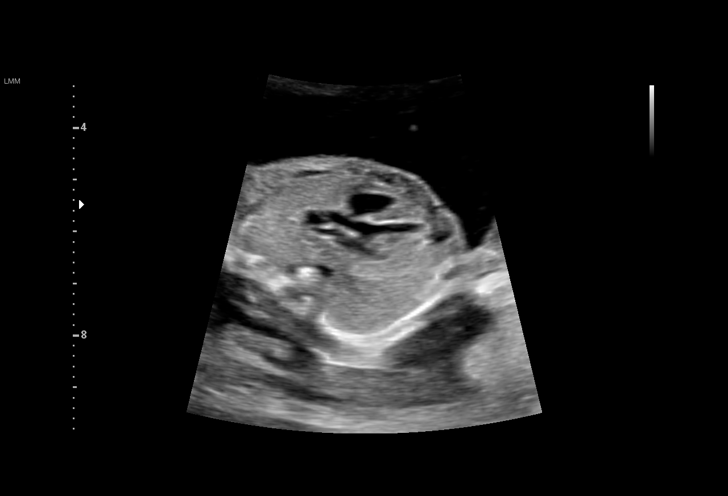

[13 of 28 positions shown; findings below may reference images not displayed]

FINDINGS: 1. Single intrauterine pregnancy.
2. Fetal biometry is consistent with dating.
3. Posterior placenta without evidence of previa.
4. Normal amniotic fluid volume.
5. Normal transabdominal cervical length.
6. The view of the heart and spine are limited.
7. The remainder of the limited anatomy survey is normal.
Recommendations

1. Appropriate fetal growth.
2. Limited anatomy survey:
- recommend follow up in 4 weeks to reevaluate fetal anatomy
3. Advanced maternal age:
- previously counseled
- declined aneuploidy screening
4. Methadone maintenance:
- previously counseled
- recommend fetal growth every 4 weeks
- recommend inform Pediatrics at delivery
5. Hepatitis C:
- has been referred to GI

## 2017-08-14 ENCOUNTER — Encounter: Payer: Self-pay | Admitting: Internal Medicine

## 2017-08-15 ENCOUNTER — Encounter: Payer: Self-pay | Admitting: Obstetrics and Gynecology

## 2017-08-15 ENCOUNTER — Ambulatory Visit (INDEPENDENT_AMBULATORY_CARE_PROVIDER_SITE_OTHER): Payer: Medicaid Other | Admitting: Obstetrics and Gynecology

## 2017-08-15 VITALS — BP 123/78 | HR 101 | Ht 70.0 in | Wt 202.1 lb

## 2017-08-15 DIAGNOSIS — G8918 Other acute postprocedural pain: Secondary | ICD-10-CM

## 2017-08-15 DIAGNOSIS — Z30011 Encounter for initial prescription of contraceptive pills: Secondary | ICD-10-CM

## 2017-08-15 DIAGNOSIS — Z98891 History of uterine scar from previous surgery: Secondary | ICD-10-CM

## 2017-08-15 DIAGNOSIS — B182 Chronic viral hepatitis C: Secondary | ICD-10-CM

## 2017-08-15 LAB — POCT PREGNANCY, URINE: Preg Test, Ur: NEGATIVE

## 2017-08-15 MED ORDER — GABAPENTIN 300 MG PO CAPS: 300.0000 mg | ORAL_CAPSULE | Freq: Three times a day (TID) | ORAL | 0 refills | Status: DC | PRN

## 2017-08-15 MED ORDER — NORGESTIM-ETH ESTRAD TRIPHASIC 0.18/0.215/0.25 MG-35 MCG PO TABS: 1.0000 | ORAL_TABLET | Freq: Every day | ORAL | 11 refills | Status: DC

## 2017-08-15 NOTE — Patient Instructions (Signed)
Follow-up for colposcopy due to history of abnormal pap smears

## 2017-08-15 NOTE — Progress Notes (Signed)
Pt states incision still burns,Pt wants to consider B/C Orthotricycline.

## 2017-08-15 NOTE — Progress Notes (Signed)
     Subjective: Chief Complaint  Patient presents with  . Contraception     HPI: [name] is a 37 y.o. presenting to clinic today to discuss the following:  #Birth Control -did not follow-up for her postpartum visit so did not get placed on birth cotnrol -wanting OCPs -sexually active  #Incision Pain -s/p pLTCS -has since been having incisional pain -will take an occasional gabapentin for pain relief -requesting refill  #Hep C -has not followed with ID -missed her appointment yesterday -not been treated   ROS noted in HPI.   Past Medical, Surgical, Social, and Family History Reviewed & Updated per EMR.     Social History   Tobacco Use  Smoking Status Former Smoker  Smokeless Tobacco Never Used    Objective: BP 123/78   Pulse (!) 101   Ht 5\' 10"  (1.778 m)   Wt 91.7 kg (202 lb 1.6 oz)   LMP 08/08/2017 (Exact Date)   Breastfeeding? No   BMI 29.00 kg/m  Vitals and nursing notes reviewed  Physical Exam  Constitutional: She is well-developed, well-nourished, and in no distress.  HENT:  Head: Normocephalic and atraumatic.  Eyes: Conjunctivae and EOM are normal.  Cardiovascular: Normal rate and regular rhythm.  Pulmonary/Chest: Effort normal.  Abdominal: Soft. She exhibits no distension. There is no tenderness. There is no guarding.  Incision healed well.  Musculoskeletal: Normal range of motion.  Skin: Skin is warm and dry.  Psychiatric: Mood and affect normal.    Results for orders placed or performed in visit on 08/15/17 (from the past 72 hour(s))  Pregnancy, urine POC     Status: None   Collection Time: 08/15/17 11:49 AM  Result Value Ref Range   Preg Test, Ur NEGATIVE NEGATIVE    Comment:        THE SENSITIVITY OF THIS METHODOLOGY IS >24 mIU/mL     Assessment/Plan: 1. Post-op pain S/p pLTCS for breech. Patient continues to indorse incisional pain. Refilled gabapentin. Incision is well-healed. - gabapentin (NEURONTIN) 300 MG  capsule; Take 1 capsule (300 mg total) by mouth 3 (three) times daily as needed.  Dispense: 30 capsule; Refill: 0  2. Encounter for initial prescription of contraceptive pills Rx for ortho tri cyclen OCP given as this has worked well for patient before. She is no longer breastfeeding.   3. S/P cesarean section Doing well post-op. Missed initial postpartum visit.    4. Chronic hepatitis C without hepatic coma (HCC) Has to call to rescheduled appointment with ID since she missed her appointment. Encouraged patient to follow-up so she can be treated properly.   PATIENT EDUCATION PROVIDED: See AVS    Diagnosis and plan along with any newly prescribed medication(s) were discussed in detail with this patient today. The patient verbalized understanding and agreed with the plan.   Orders Placed This Encounter  Procedures  . Pregnancy, urine POC    Meds ordered this encounter  Medications  . Norgestimate-Ethinyl Estradiol Triphasic 0.18/0.215/0.25 MG-35 MCG tablet    Sig: Take 1 tablet by mouth daily.    Dispense:  1 Package    Refill:  11  . gabapentin (NEURONTIN) 300 MG capsule    Sig: Take 1 capsule (300 mg total) by mouth 3 (three) times daily as needed.    Dispense:  30 capsule    Refill:  0    Caryl AdaJazma Riccardo Holeman, DO 08/15/2017, 3:36 PM [facility], [facility]

## 2017-08-16 ENCOUNTER — Encounter: Payer: Self-pay | Admitting: Obstetrics and Gynecology

## 2017-08-18 LAB — POCT PREGNANCY, URINE: Preg Test, Ur: NEGATIVE

## 2017-08-28 ENCOUNTER — Encounter: Payer: Self-pay | Admitting: Internal Medicine

## 2017-09-05 NOTE — Progress Notes (Deleted)
   Subjective:    Patient ID: Karen JoSarah E Nardozzi, female    DOB: 06/23/1981, 37 y.o.   MRN: [id number]  No chief complaint on file.    HPI:  [name] is a 37 y.o. female who presents today for initial evaluation and management of hepatitis C.  Ms. Adah SalvageLassiter has chronic Hepatitis C that was initially noted in 2014. Her most recent blood work from 10/02/16 shows a viral load of 1,050,000 IU/ml. She has never been treated for this. Her risk factors include. She denies any personal or family history of liver disease. Currently asymptomatic. Hepatitis B surface antigen was negative on 10/02/16. Hepatitis A antibody status is unknown.    No Known Allergies    Outpatient Medications Prior to Visit  Medication Sig Dispense Refill  . cyclobenzaprine (FLEXERIL) 10 MG tablet Take 1 tablet (10 mg total) by mouth 3 (three) times daily as needed for muscle spasms. (Patient taking differently: Take 10 mg by mouth 3 (three) times daily as needed (for migraine headaches.). ) 30 tablet 1  . ferrous sulfate 325 (65 FE) MG tablet Take 1 tablet (325 mg total) by mouth 2 (two) times daily with a meal. 60 tablet 3  . FIBER ADULT GUMMIES PO Take 2 tablets by mouth daily.     Marland Kitchen. gabapentin (NEURONTIN) 300 MG capsule Take 1 capsule (300 mg total) by mouth 3 (three) times daily as needed. 30 capsule 0  . ibuprofen (ADVIL,MOTRIN) 800 MG tablet Take 1 tablet (800 mg total) by mouth every 8 (eight) hours as needed for mild pain or moderate pain. 45 tablet 3  . methadone (DOLOPHINE) 10 MG/ML solution Take 107 mg by mouth daily.     . Norgestimate-Ethinyl Estradiol Triphasic 0.18/0.215/0.25 MG-35 MCG tablet Take 1 tablet by mouth daily. 1 Package 11   No facility-administered medications prior to visit.      Past Medical History:  Diagnosis Date  . 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 Hepatitis C    2014  . Herniated disc   . 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 Shoulder  dislocation, recurrent      Past Surgical History:  Procedure Laterality Date  . CESAREAN SECTION N/A 04/20/2017   Procedure: CESAREAN SECTION;  Surgeon: Hermina StaggersErvin, Michael L, MD;  [facility];  Service: Obstetrics;  Laterality: N/A;  . NO PAST SURGERIES         Review of Systems    Objective:    There were no vitals taken for this visit. Nursing note and vital signs reviewed.  Physical Exam     Assessment & Plan:   Problem List Items Addressed This Visit    None       I am having Karen JoSarah E. Avellino maintain her methadone, FIBER ADULT GUMMIES PO, cyclobenzaprine, ferrous sulfate, ibuprofen, Norgestimate-Ethinyl Estradiol Triphasic, and gabapentin.   No orders of the defined types were placed in this encounter.    Follow-up: No Follow-up on file.   Marcos EkeGreg Margeaux Swantek, MSN, [facility]   .

## 2017-09-08 ENCOUNTER — Encounter: Payer: Self-pay | Admitting: Family

## 2017-10-16 IMAGING — US US MFM OB FOLLOW-UP
1 series · 14 of 28 positions shown · non-contrast
Comparison: none

[Series 1: us mfm ob follow-up · 14 of 36 slices shown]
[im 2/36]
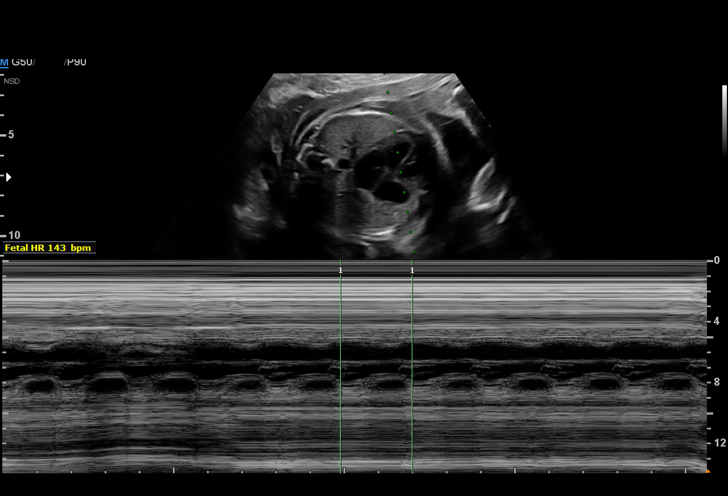
[im 4/36]
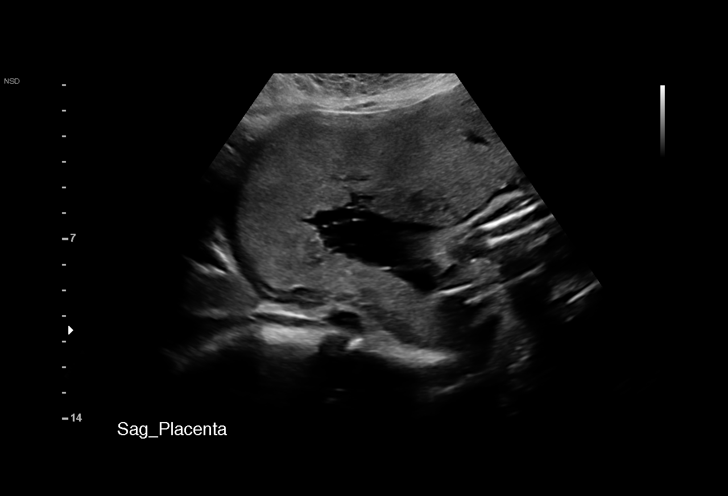
[im 7/36]
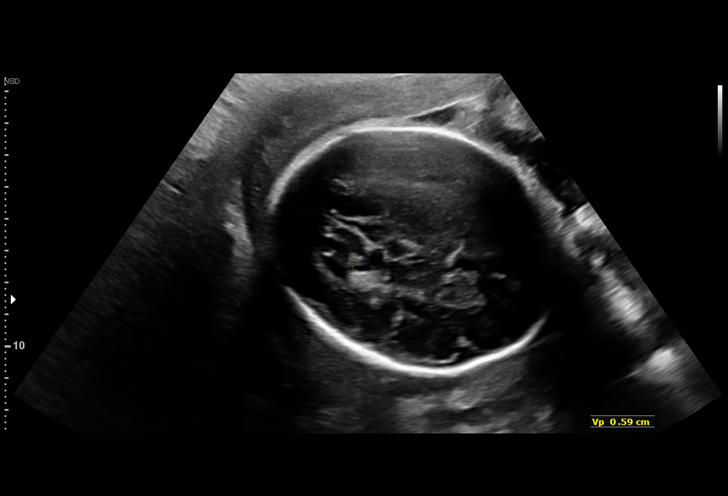
[im 10/36]
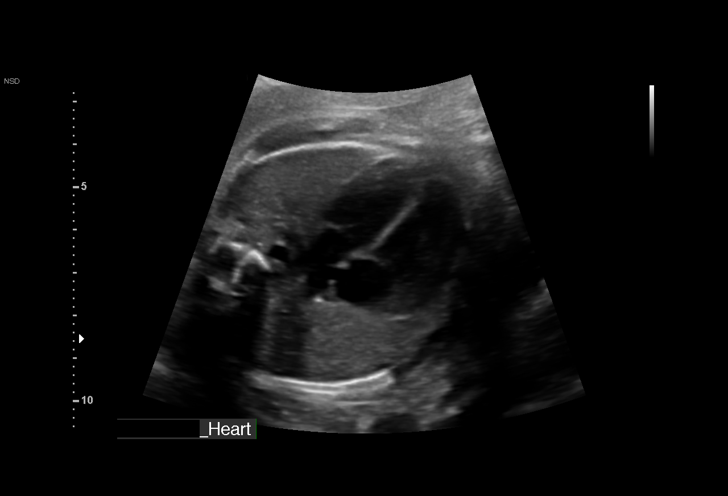
[im 12/36]
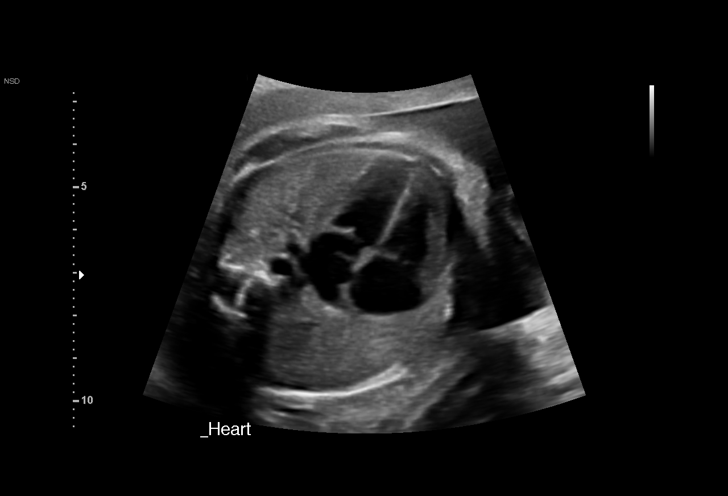
[im 15/36]
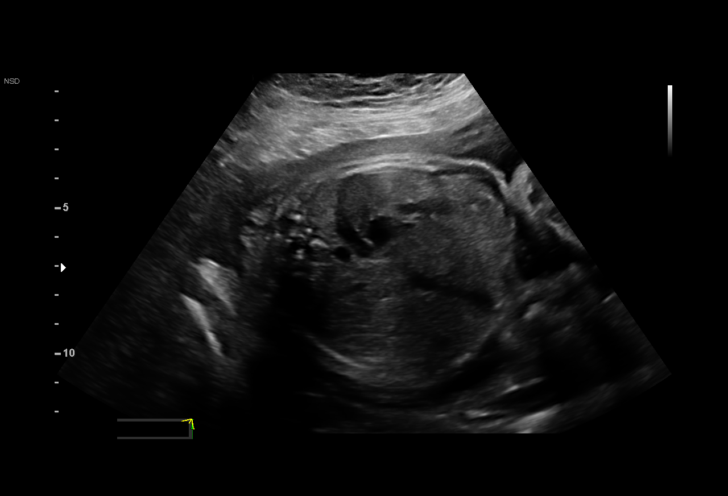
[im 17/36]
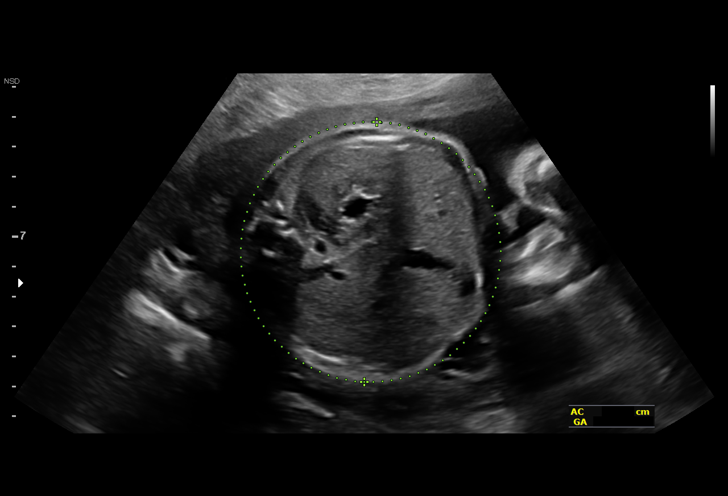
[im 20/36]
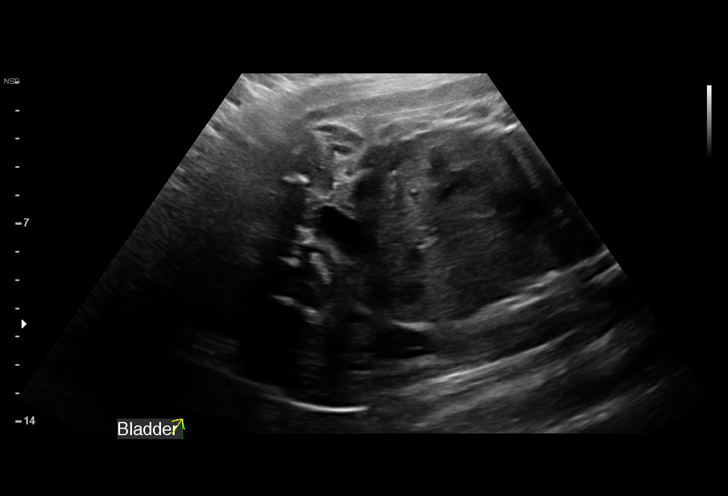
[im 23/36]
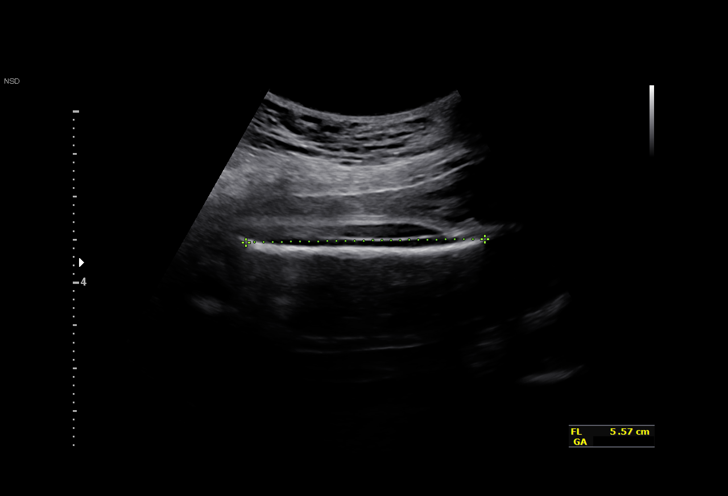
[im 25/36]
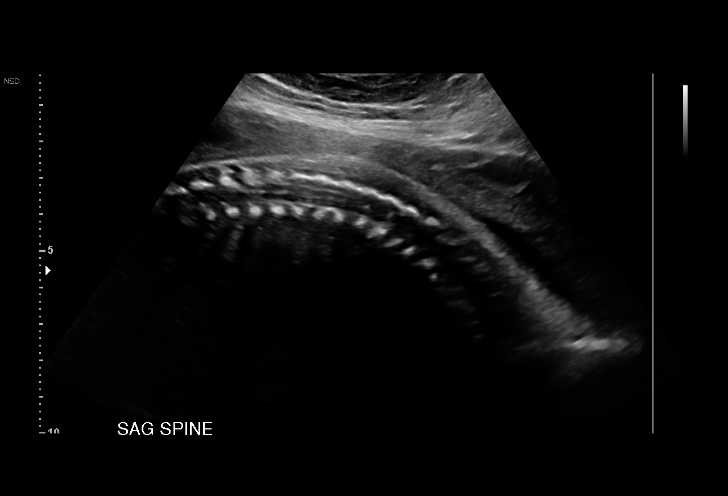
[im 28/36]
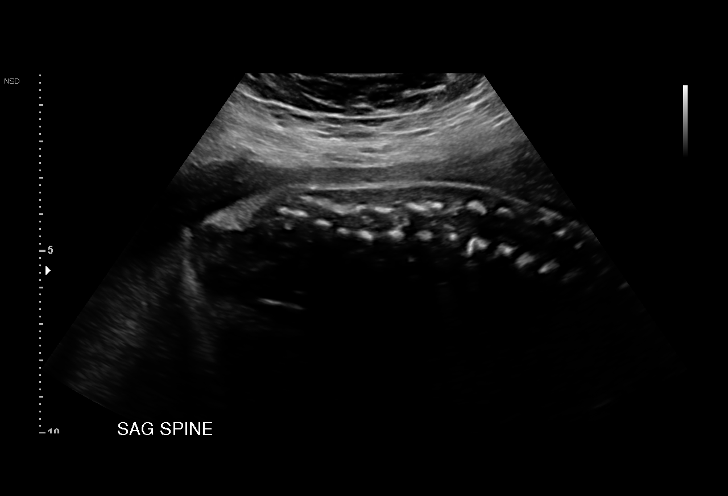
[im 30/36]
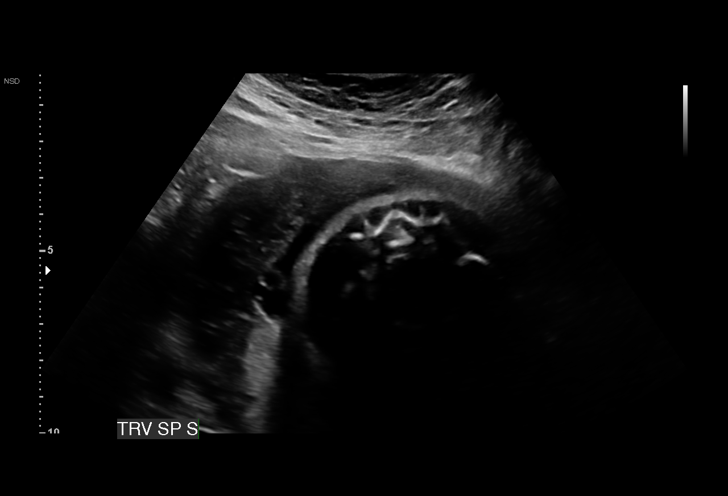
[im 33/36]
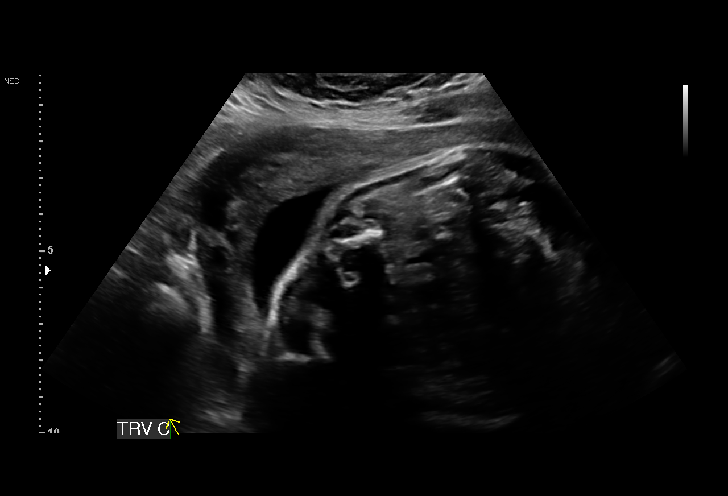
[im 36/36]
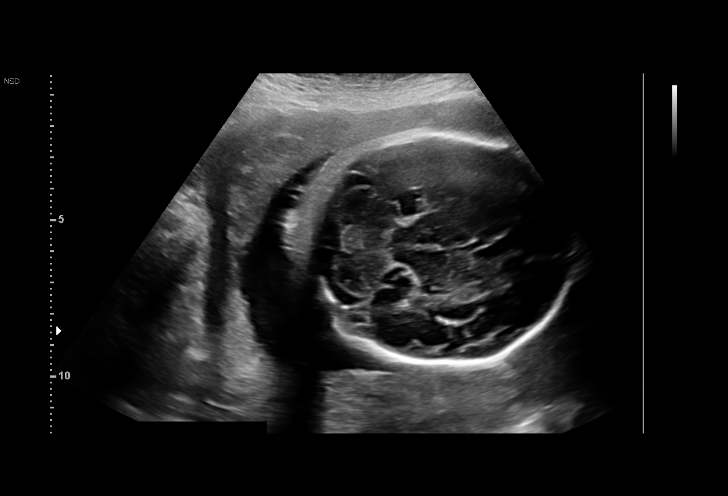

[14 of 28 positions shown; findings below may reference images not displayed]

OB/Gyn Clinic
CHARLE FLOW ABIGAEL
DO

1  KLEVER JUMPER              [id number]      [id number]     [id number]
Indications

29 weeks gestation of pregnancy
Heroin/Methadone use
Chronic Hepatitis C complicating pregnancy,
antepartum
Advanced maternal age multigravida 35+,
second trimester
Encounter for other antenatal screening
follow-up
OB History

Blood Type:            Height:  5'10"  Weight (lb):  178      BMI:
Gravidity:    6         Term:   2        Prem:   0        SAB:   1
TOP:          2       Ectopic:  0        Living: 2
Fetal Evaluation

Num Of Fetuses:     1
Fetal Heart         143
Rate(bpm):
Cardiac Activity:   Observed
Presentation:       Cephalic
Placenta:           Posterior, above cervical os
P. Cord Insertion:  Previously Visualized

Amniotic Fluid
AFI FV:      Subjectively within normal limits

AFI Sum(cm)     %Tile       Largest Pocket(cm)
15.69           56

RUQ(cm)       RLQ(cm)       LUQ(cm)        LLQ(cm)
3.74
Biometry

BPD:      73.5  mm     G. Age:  29w 4d         44  %    CI:        73.64   %   70 - 86
FL/HC:      20.4   %   19.6 -
HC:      272.1  mm     G. Age:  29w 5d         28  %    HC/AC:      1.03       0.99 -
AC:      265.1  mm     G. Age:  30w 4d         81  %    FL/BPD:     75.6   %   71 - 87
FL:       55.6  mm     G. Age:  29w 2d         36  %    FL/AC:      21.0   %   20 - 24

Est. FW:    1679  gm      3 lb 5 oz     66  %
Gestational Age

LMP:           29w 2d       Date:   07/22/16                 EDD:   04/28/17
U/S Today:     29w 6d                                        EDD:   04/24/17
Best:          29w 2d    Det. By:   LMP  (07/22/16)          EDD:   04/28/17
Anatomy

Cranium:               Appears normal         LVOT:                   Previously seen
Cavum:                 Previously seen        Aortic Arch:            Previously seen
Ventricles:            Appears normal         Ductal Arch:            Previously seen
Choroid Plexus:        Previously seen        Diaphragm:              Appears normal
Cerebellum:            Previously seen        Stomach:                Appears normal, left
sided
Posterior Fossa:       Previously seen        Abdomen:                Appears normal
Nuchal Fold:           Previously seen        Abdominal Wall:         Previously seen
Face:                  Orbits and profile     Cord Vessels:           Previously seen
previously seen
Lips:                  Previously seen        Kidneys:                Appear normal
Palate:                Previously seen        Bladder:                Appears normal
Thoracic:              Appears normal         Spine:                  Appears normal
Heart:                 Appears normal         Upper Extremities:      Previously seen
(4CH, axis, and situs
RVOT:                  Previously seen        Lower Extremities:      Previously seen

Other:  PARENTS DO NOT WANT TO KNOW GENDER. Heels and 5th digit
previously seen. Technically difficult due to fetal position.
Cervix Uterus Adnexa

Cervix
Not visualized (advanced GA >22wks)

Left Ovary
Previously seen.

Right Ovary
Previously seen
Impression

Single IUP at 29w 2d
Chronic Hepatitis C, Methadone use
Normal interval anatomy
Fetal growth is appropriate (66th %tile)
Posterior placenta without previa
Normal amniotic fluid volume
Recommendations

Recommend follow-up ultrasound examination in 6 weeks for
interval growth

## 2017-11-07 ENCOUNTER — Telehealth: Payer: Self-pay | Admitting: General Practice

## 2017-11-07 NOTE — Telephone Encounter (Signed)
Patient called and left message on nurse voicemail line stating she is having issues with her cycle. Patient states on day 5 of her period, she is having heavy bleeding & passing clots. Called patient, no answer- voicemail full unable to leave message. Called home number, no answer- phone rings continuously without voicemail option.

## 2017-11-10 NOTE — Telephone Encounter (Signed)
Called patient, no answer- unable to leave message as voicemail has not been set up.

## 2018-07-16 ENCOUNTER — Telehealth: Payer: Self-pay | Admitting: General Practice

## 2018-07-16 DIAGNOSIS — Z30011 Encounter for initial prescription of contraceptive pills: Secondary | ICD-10-CM

## 2018-07-16 MED ORDER — NORGESTIM-ETH ESTRAD TRIPHASIC 0.18/0.215/0.25 MG-35 MCG PO TABS: 1.0000 | ORAL_TABLET | Freq: Every day | ORAL | 2 refills | Status: DC

## 2018-07-16 NOTE — Telephone Encounter (Signed)
Patient called and left message on nurse voicemail line stating she needs a refill on her trisprintec. Patient states she didn't realize she was out of refills and knows she needs to come in for a yearly exam. Refill sent per protocol. Called & informed patient and discussed limited number of refills until her next appt. Patient verbalized understanding & had no questions.

## 2018-09-14 ENCOUNTER — Telehealth: Payer: Self-pay | Admitting: Obstetrics & Gynecology

## 2018-09-14 DIAGNOSIS — Z30011 Encounter for initial prescription of contraceptive pills: Secondary | ICD-10-CM

## 2018-09-14 NOTE — Telephone Encounter (Signed)
Patient called to schedule an appointment to get birth control, and a pap smear. Informed patient of walk-in hours Monday through Thursday, from 4:00 pm to 7:30 pm. Patient stated she wanted an appointment, but would like Lyla Son RN to call her in a Rx for more birth control pills.

## 2018-09-15 MED ORDER — NORGESTIM-ETH ESTRAD TRIPHASIC 0.18/0.215/0.25 MG-35 MCG PO TABS: 1.0000 | ORAL_TABLET | Freq: Every day | ORAL | 0 refills | Status: DC

## 2018-09-15 NOTE — Telephone Encounter (Signed)
Called patient and she states she needs a refill on her OCPs because her appt isn't until 4/8 and she will be out of pills at that time. Additional refill sent in for patient. Patient verbalized understanding & had no questions.

## 2018-09-15 NOTE — Addendum Note (Signed)
Addended by: Kathee Delton on: 09/15/2018 11:03 AM   Modules accepted: Orders

## 2018-10-21 ENCOUNTER — Ambulatory Visit: Payer: Self-pay | Admitting: Obstetrics & Gynecology

## 2018-11-04 ENCOUNTER — Telehealth: Payer: Self-pay | Admitting: Obstetrics and Gynecology

## 2018-11-04 ENCOUNTER — Other Ambulatory Visit: Payer: Self-pay | Admitting: Obstetrics & Gynecology

## 2018-11-04 DIAGNOSIS — Z30011 Encounter for initial prescription of contraceptive pills: Secondary | ICD-10-CM

## 2018-11-04 NOTE — Telephone Encounter (Signed)
Called the patient to inform of upcoming virtual visit and how it works. The patient verbalized understanding.

## 2018-11-05 ENCOUNTER — Other Ambulatory Visit: Payer: Self-pay

## 2018-11-05 ENCOUNTER — Encounter: Payer: Self-pay | Admitting: Obstetrics & Gynecology

## 2018-11-05 ENCOUNTER — Ambulatory Visit (INDEPENDENT_AMBULATORY_CARE_PROVIDER_SITE_OTHER): Payer: Medicaid Other | Admitting: Obstetrics & Gynecology

## 2018-11-05 DIAGNOSIS — Z3041 Encounter for surveillance of contraceptive pills: Secondary | ICD-10-CM | POA: Diagnosis not present

## 2018-11-05 DIAGNOSIS — R87612 Low grade squamous intraepithelial lesion on cytologic smear of cervix (LGSIL): Secondary | ICD-10-CM | POA: Diagnosis not present

## 2018-11-05 DIAGNOSIS — Z30011 Encounter for initial prescription of contraceptive pills: Secondary | ICD-10-CM

## 2018-11-05 MED ORDER — NORGESTIM-ETH ESTRAD TRIPHASIC 0.18/0.215/0.25 MG-35 MCG PO TABS: 1.0000 | ORAL_TABLET | Freq: Every day | ORAL | 12 refills | Status: DC

## 2018-11-05 NOTE — Patient Instructions (Signed)
Colposcopy  Colposcopy is a procedure to examine the lowest part of the uterus (cervix), the vagina, and the area around the vaginal opening (vulva) for abnormalities or signs of disease. The procedure is done using a lighted microscope or magnifying lens (colposcope). If any unusual cells are found during the procedure, your health care provider may remove a tissue sample for testing (biopsy). A colposcopy may be done if you:   Have an abnormal Pap test. A Pap test is a screening test that is used to check for signs of cancer or infection of the vagina, cervix, and uterus.   Have a Pap smear test in which you test positive for high-risk HPV (human papillomavirus).   Have a sore or lesion on your cervix.   Have genital warts on your vulva, vagina, or cervix.   Took certain medicines while pregnant, such as diethylstilbestrol (DES).   Have pain during sexual intercourse.   Have vaginal bleeding, especially after sexual intercourse.   Need to have a cervical polyp removed.   Need to have a lost intrauterine device (IUD) string located.  Let your health care provider know about:   Any allergies you have, including allergies to prescribed medicine, latex, or iodine.   All medicines you are taking, including vitamins, herbs, eye drops, creams, and over-the-counter medicines. Bring a list of all of your medicines to your appointment.   Any problems you or family members have had with anesthetic medicines.   Any blood disorders you have.   Any surgeries you have had.   Any medical conditions you have, such as pelvic inflammatory disease (PID) or endometrial disorder.   Any history of frequent fainting.   Your menstrual cycle and what form of birth control (contraception) you use.   Your medical history, including any prior cervical treatment.   Whether you are pregnant or may be pregnant.  What are the risks?  Generally, this is a safe procedure. However, problems may occur,  including:   Pain.   Infection, which may include a fever, bad-smelling discharge, or pelvic pain.   Bleeding or discharge.   Misdiagnosis.   Fainting and vasovagal reactions, but this is rare.   Allergic reactions to medicines.   Damage to other structures or organs.  What happens before the procedure?   If you have your menstrual period or will have it at the time of your procedure, tell your health care provider. A colposcopy typically is not done during menstruation.   Continue your contraceptive practices before and after the procedure.   For 24 hours before the colposcopy:  ? Do not douche.  ? Do not use tampons.  ? Do not use medicines, creams, or suppositories in the vagina.  ? Do not have sexual intercourse.   Ask your health care provider about:  ? Changing or stopping your regular medicines. This is especially important if you are taking diabetes medicines or blood thinners.  ? Taking medicines such as aspirin and ibuprofen. These medicines can thin your blood. Do not take these medicines before your procedure if your health care provider instructs you not to. It is likely that your health care provider will tell you to avoid taking aspirin or medicine that contains aspirin for 7 days before the procedure.   Follow instructions from your health care provider about eating or drinking restrictions. You will likely need to eat a regular diet the day of the procedure and not skip any meals.   You may have an exam   or testing. A pregnancy test will be taken on the day of the procedure.   You may have a blood or urine sample taken.   Plan to have someone take you home from the hospital or clinic.   If you will be going home right after the procedure, plan to have someone with you for 24 hours.  What happens during the procedure?   You will lie down on your back, with your feet in foot rests (stirrups).   A warmed and lubricated instrument (speculum) will be inserted into your vagina. The  speculum will be used to hold apart the walls of your vagina so your health care provider can see your cervix and the inside of your vagina.   A cotton swab will be used to place a small amount of liquid solution on the areas to be examined. This solution makes it easier to see abnormal cells. You may feel a slight burning during this part.   The colposcope will be used to scan the cervix with a bright white light. The colposcope will be held near your vulvaand will magnify your vulva, vagina, and cervix for easier examination.   Your health care provider may decide to take a biopsy. If so:  ? You may be given medicine to numb the area (local anesthetic).  ? Surgical instruments will be used to suck out mucus and cells through your vagina.  ? You may feel mild pain while the tissue sample is removed.  ? Bleeding may occur. A solution may be used to stop the bleeding.  ? If a sample of tissue is needed from the inside of the cervix, a different procedure called endocervical curettage (ECC) may be completed. During this procedure, a curved instrument (curette) will be used to scrape cells from your cervix or the top of your cervix (endocervix).   Your health care provider will record the location of any abnormalities.  The procedure may vary among health care providers and hospitals.  What happens after the procedure?   You will lie down and rest for a few minutes. You may be offered juice or cookies.   Your blood pressure, heart rate, breathing rate, and blood oxygen level will be monitored until any medicines you were given have worn off.   You may have to wear compression stockings. These stockings help to prevent blood clots and reduce swelling in your legs.   You may have some cramping in your abdomen. This should go away after a few minutes.  This information is not intended to replace advice given to you by your health care provider. Make sure you discuss any questions you have with your health care  provider.  Document Released: 09/21/2002 Document Revised: 02/27/2016 Document Reviewed: 02/05/2016  [employer] Interactive Patient Education  2019 [employer]

## 2018-11-05 NOTE — Progress Notes (Signed)
Patient ID: [name], female   DOB: 1981-03-05, 38 y.o.   MRN: [id number]   TELEHEALTH VIRTUAL GYNECOLOGY VISIT ENCOUNTER NOTE  I connected with [name] on 11/05/18 at  2:35 PM EDT by telephone at home and verified that I am speaking with the correct person using two identifiers.   I discussed the limitations, risks, security and privacy concerns of performing an evaluation and management service by telephone and the availability of in person appointments. I also discussed with the patient that there may be a patient responsible charge related to this service. The patient expressed understanding and agreed to proceed.   History:  [name] is a 38 y.o. [phone] female being evaluated today for refill of her OCP. She denies any abnormal vaginal discharge, bleeding, pelvic pain or other concerns.    Her last pap 08/2016 was LsIL and she needs colposcopy   Past Medical History:  Diagnosis Date  . 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 Hepatitis C    2014  . Herniated disc   . 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 Shoulder dislocation, recurrent    Past Surgical History:  Procedure Laterality Date  . CESAREAN SECTION N/A 04/20/2017   Procedure: CESAREAN SECTION;  Surgeon: Hermina Staggers, MD;  Location: [facility] BIRTHING SUITES;  Service: Obstetrics;  Laterality: N/A;  . NO PAST SURGERIES     The following portions of the patient's history were reviewed and updated as appropriate: allergies, current medications, past family history, past medical history, past social history, past surgical history and problem list.   Health Maintenance:  LSIL pap.   Review of Systems:  Pertinent items noted in HPI and remainder of comprehensive ROS otherwise negative.  Physical Exam:   General:  Alert, oriented and cooperative.   Mental Status: Normal mood and affect perceived. Normal judgment and thought content.  Physical exam deferred due to nature of  the encounter  Labs and Imaging No results found for this or any previous visit (from the past 336 hour(s)). No results found.    Assessment and Plan:     1. Low grade squamous intraepithelial lesion on cytologic smear of cervix (LGSIL) Schedule colposcopy  2. Oral contraceptive pill surveillance refill  3. Encounter for initial prescription of contraceptive pills refill - Norgestimate-Ethinyl Estradiol Triphasic (TRI-PREVIFEM) 0.18/0.215/0.25 MG-35 MCG tablet; Take 1 tablet by mouth daily.  Dispense: 28 tablet; Refill: 12       I discussed the assessment and treatment plan with the patient. The patient was provided an opportunity to ask questions and all were answered. The patient agreed with the plan and demonstrated an understanding of the instructions.   The patient was advised to call back or seek an in-person evaluation/go to the ED if the symptoms worsen or if the condition fails to improve as anticipated.  I provided 10 minutes of non-face-to-face time during this encounter.   Scheryl Darter, MD [facility], [facility]

## 2018-11-05 NOTE — Progress Notes (Signed)
I connected with  [name] on 11/05/18 at  2:35 PM EDT by telephone and verified that I am speaking with the correct person using two identifiers.   I discussed the limitations, risks, security and privacy concerns of performing an evaluation and management service by telephone and the availability of in person appointments. I also discussed with the patient that there may be a patient responsible charge related to this service. The patient expressed understanding and agreed to proceed.  Marylynn Pearson, RN 11/05/2018  2:37 PM

## 2018-12-09 ENCOUNTER — Telehealth: Payer: Self-pay | Admitting: Family Medicine

## 2018-12-09 NOTE — Telephone Encounter (Signed)
Attempted to call patient with her colposcopy appointment 6/15 @ 1:55. No answer, and could not leave a voicemail because it was not setup. Appointment reminder letter mailed

## 2018-12-24 ENCOUNTER — Telehealth: Payer: Self-pay | Admitting: Family Medicine

## 2018-12-24 NOTE — Telephone Encounter (Signed)
Tried to call patient and give call reminder was unable to leave a message.

## 2018-12-28 ENCOUNTER — Encounter: Payer: Self-pay | Admitting: Family Medicine

## 2018-12-28 ENCOUNTER — Ambulatory Visit: Payer: Medicaid Other | Admitting: Obstetrics and Gynecology

## 2019-02-23 ENCOUNTER — Other Ambulatory Visit: Payer: Self-pay

## 2019-02-23 ENCOUNTER — Emergency Department (HOSPITAL_COMMUNITY)
Admission: EM | Admit: 2019-02-23 | Discharge: 2019-02-24 | Disposition: A | Discharge status: Home / Self Care | Payer: Medicaid Other | Attending: Emergency Medicine | Admitting: Emergency Medicine

## 2019-02-23 ENCOUNTER — Encounter (HOSPITAL_COMMUNITY): Payer: Self-pay | Admitting: *Deleted

## 2019-02-23 DIAGNOSIS — M5136 Other intervertebral disc degeneration, lumbar region: Secondary | ICD-10-CM | POA: Insufficient documentation

## 2019-02-23 DIAGNOSIS — Z87891 Personal history of nicotine dependence: Secondary | ICD-10-CM | POA: Diagnosis not present

## 2019-02-23 DIAGNOSIS — Z79899 Other long term (current) drug therapy: Secondary | ICD-10-CM | POA: Insufficient documentation

## 2019-02-23 DIAGNOSIS — M545 Low back pain: Secondary | ICD-10-CM | POA: Diagnosis present

## 2019-02-23 DIAGNOSIS — M5137 Other intervertebral disc degeneration, lumbosacral region: Secondary | ICD-10-CM

## 2019-02-23 NOTE — ED Triage Notes (Addendum)
Pt presents with multiple complaints. Reports her children have videos of her "just falling asleep." has had frequent falls "where I've just going to sleep all of a sudden." Also reports profuse nosebleeds today. Is also in a workman's comp and has not followed up with her appts.

## 2019-02-24 ENCOUNTER — Emergency Department (HOSPITAL_COMMUNITY): Payer: Medicaid Other

## 2019-02-24 LAB — CBC WITH DIFFERENTIAL/PLATELET
Abs Immature Granulocytes: 0.01 10*3/uL (ref 0.00–0.07)
Basophils Absolute: 0 10*3/uL (ref 0.0–0.1)
Basophils Relative: 1 %
Eosinophils Absolute: 0.1 10*3/uL (ref 0.0–0.5)
Eosinophils Relative: 3 %
HCT: 32.2 % — ABNORMAL LOW (ref 36.0–46.0)
Hemoglobin: 10.4 g/dL — ABNORMAL LOW (ref 12.0–15.0)
Immature Granulocytes: 0 %
Lymphocytes Relative: 52 %
Lymphs Abs: 2.3 10*3/uL (ref 0.7–4.0)
MCH: 28.7 pg (ref 26.0–34.0)
MCHC: 32.3 g/dL (ref 30.0–36.0)
MCV: 88.7 fL (ref 80.0–100.0)
Monocytes Absolute: 0.3 10*3/uL (ref 0.1–1.0)
Monocytes Relative: 8 %
Neutro Abs: 1.6 10*3/uL — ABNORMAL LOW (ref 1.7–7.7)
Neutrophils Relative %: 36 %
Platelets: 292 10*3/uL (ref 150–400)
RBC: 3.63 MIL/uL — ABNORMAL LOW (ref 3.87–5.11)
RDW: 14.6 % (ref 11.5–15.5)
WBC: 4 10*3/uL (ref 4.0–10.5)
nRBC: 0 % (ref 0.0–0.2)

## 2019-02-24 LAB — BASIC METABOLIC PANEL
Anion gap: 8 (ref 5–15)
BUN: 9 mg/dL (ref 6–20)
CO2: 24 mmol/L (ref 22–32)
Calcium: 8.3 mg/dL — ABNORMAL LOW (ref 8.9–10.3)
Chloride: 108 mmol/L (ref 98–111)
Creatinine, Ser: 0.7 mg/dL (ref 0.44–1.00)
GFR calc Af Amer: >60 mL/min (ref >60)
GFR calc non Af Amer: >60 mL/min (ref >60)
Glucose, Bld: 109 mg/dL — ABNORMAL HIGH (ref 70–99)
Potassium: 4 mmol/L (ref 3.5–5.1)
Sodium: 140 mmol/L (ref 135–145)

## 2019-02-24 MED ORDER — SALINE SPRAY 0.65 % NA SOLN: 1.0000 | NASAL | 0 refills | Status: DC | PRN

## 2019-02-24 MED ORDER — DIPHENHYDRAMINE HCL 50 MG/ML IJ SOLN
25.0000 mg | Freq: Once | INTRAMUSCULAR | Status: AC
  Administered 2019-02-24: 25 mg via INTRAVENOUS
  Filled 2019-02-24: qty 1

## 2019-02-24 MED ORDER — METOCLOPRAMIDE HCL 5 MG/ML IJ SOLN
10.0000 mg | Freq: Once | INTRAMUSCULAR | Status: AC
  Administered 2019-02-24: 10 mg via INTRAVENOUS
  Filled 2019-02-24: qty 2

## 2019-02-24 MED ORDER — KETOROLAC TROMETHAMINE 30 MG/ML IJ SOLN
30.0000 mg | Freq: Once | INTRAMUSCULAR | Status: AC
  Administered 2019-02-24: 05:00:00 30 mg via INTRAVENOUS
  Filled 2019-02-24: qty 1

## 2019-02-24 NOTE — ED Notes (Signed)
Pt states "they don't want me walking out to the damn car". Continues to eat snack, complain on the phone, etc.

## 2019-02-24 NOTE — ED Notes (Addendum)
Patient returned to the lobby; complaining about this nurse, repeating "stupid bitch" while she is on the phone. Continues to use profanity and intermittently loud. Patient also keeps saying loudly for staff to hear, "just let somebody say something to me". Pt walked to vending machine to get snack/drink.

## 2019-02-24 NOTE — ED Notes (Signed)
Patient transported to MRI 

## 2019-02-24 NOTE — ED Notes (Addendum)
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 I don't care I wont lower my voice." Patient was advised to lower her voice.

## 2019-02-24 NOTE — Discharge Instructions (Signed)
Please follow-up with your spine specialist.  Please take your MRI results to your next appointment.  Use the nasal sprays to help prevent bloody noses.  Return for new or worsening symptoms.

## 2019-02-24 NOTE — ED Notes (Signed)
Pt understood dc material. NAD noted. 

## 2019-02-24 NOTE — ED Provider Notes (Signed)
[facility] Provider Note   CSN: [id number] Arrival date & time: 02/23/19  2320     History   Chief Complaint Chief Complaint  Patient presents with   Multiple Complaints    HPI [name] is a [age] y.o. female.     Patient presents to the emergency department with a chief complaint of low back pain.  She states that she fell while at work approximately 1 month ago.  She states that she has been having low back pain since the injury.  She has been seen by a spine specialist, and had plain films performed, she has not had any MRI.  She reports that she has had difficulty balancing and has frequent falls.  She attributes this to the amount of pain that she has radiating from her back into her legs.  She denies any bowel or bladder incontinence.  Denies any fever chills.    The history is provided by the patient. No language interpreter was used.    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 Authorizing Provider  methadone (DOLOPHINE) 10 MG/ML solution Take 1 mg by mouth daily.     [provider]  Multiple Vitamins-Calcium (ONE-A-DAY WOMENS PO) Take 1 tablet by mouth.    [provider]  Norgestimate-Ethinyl Estradiol Triphasic (TRI-PREVIFEM) 0.18/0.215/0.25 MG-35 MCG tablet Take 1 tablet by mouth daily. 11/05/18   Adam PhenixArnold, James G, MD    Family History Family History  Problem Relation Age of Onset   COPD Mother    Drug abuse Mother    Alcohol abuse Mother    Hypertension Mother    Arthritis Father    Diabetes Father    Alcohol abuse Father    Drug abuse Father    Hypertension Father     Social History Social History   Tobacco Use   Smoking status: Former Smoker   Smokeless tobacco: Never Used  Substance Use Topics   Alcohol use: No   Drug use: Yes    Types: Heroin, IV, Other-see comments    Comment:  107mg  mg of methadone     Allergies   Patient has no known allergies.   Review of Systems Review of Systems  All other systems reviewed and are negative.    Physical Exam Updated Vital Signs BP (!) 144/77 (BP Location: Right Arm)    Pulse 87    Temp 98.5 F (36.9 C) (Oral)    Resp 20    LMP 01/25/2019    SpO2 97%   Physical Exam  Physical Exam  Constitutional: Pt appears well-developed and well-nourished. No distress.  HENT:  Head: Normocephalic and atraumatic.  Mouth/Throat: Oropharynx is clear and moist. No oropharyngeal exudate.  Eyes: Conjunctivae are normal.  Neck: Normal range of motion. Neck supple.  No meningismus Cardiovascular: Normal rate, regular rhythm and intact distal pulses.   Pulmonary/Chest: Effort normal and breath sounds normal. No respiratory distress. Pt has no wheezes.  Abdominal: Pt exhibits no  distension Musculoskeletal:  Moderate lumbar paraspinal muscle tenderness, no bony CTLS spine tenderness, deformity, step-off, or crepitus Lymphadenopathy: Pt has no cervical adenopathy.  Neurological: Pt is alert and oriented Speech is clear and goal oriented, follows commands Normal 5/5 strength in upper and lower extremities bilaterally including dorsiflexion and plantar flexion Sensation intact Moves extremities without ataxia, coordination intact  Normal gait Normal balance Skin: Skin is warm and dry. No rash noted. Pt is not diaphoretic. No erythema. scattered contusions on extremities Psychiatric: Pt has a normal mood and affect. Behavior is normal.  Nursing note and vitals reviewed.  ED Treatments / Results  Labs (all labs ordered are listed, but only abnormal results are displayed) Labs Reviewed  CBC WITH DIFFERENTIAL/PLATELET - Abnormal; Notable for the following components:      Result Value   RBC 3.63 (*)    Hemoglobin 10.4 (*)    HCT 32.2 (*)    Neutro Abs 1.6 (*)    All other components within normal limits  BASIC METABOLIC PANEL - Abnormal; Notable for the following components:   Glucose, Bld 109 (*)    Calcium 8.3 (*)    All other components within normal limits    EKG None  Radiology Mr Lumbar Spine Wo Contrast  Result Date: 02/24/2019 CLINICAL DATA:  Back pain with frequent falls. EXAM: MRI LUMBAR SPINE WITHOUT CONTRAST TECHNIQUE: Multiplanar, multisequence MR imaging of the lumbar spine was performed. No intravenous contrast was administered. COMPARISON:  Lumbar MRI report from April 2011 FINDINGS: Segmentation:  Standard Alignment:  Physiologic Vertebrae:  No fracture, evidence of discitis, or bone lesion. Conus medullaris and cauda equina: Conus extends to the L2 level. Conus and cauda equina appear normal. Paraspinal and other soft tissues: Mild left lower pole renal cortical scarring. Disc levels: L5-S1 mild disc desiccation and narrowing with shallow  right eccentric protrusion. No neural mass effect throughout the lumbar spine. IMPRESSION: 1. No emergent finding. 2. L5-S1 disc degeneration which was also described on a 2011 lumbar MRI. Electronically Signed   By: Monte Fantasia M.D.   On: 02/24/2019 05:25    Procedures Procedures (including critical care time)  Medications Ordered in ED Medications - No data to display   Initial Impression / Assessment and Plan / ED Course  I have reviewed the triage vital signs and the nursing notes.  Pertinent labs & imaging results that were available during my care of the patient were reviewed by me and considered in my medical decision making (see chart for details).       Patient with a low back pain reportedly  from a fall that she sustained while working about 1 month ago.  She states that she frequently has such pain in her legs that it causes her to fall.  She denies bowel or bladder incontinence.  She denies any fevers.  She does have hx of IV drug use, but not recently.   MR of L-spine shows degenerative disc disease. Otherwise, exam and workup reassuring. Recommend spine/ortho follow-up.  Final Clinical Impressions(s) / ED Diagnoses   Final diagnoses:  Degenerative disc disease at L5-S1 level    ED Discharge Orders         Ordered    sodium chloride (OCEAN) 0.65 % SOLN nasal spray  As needed     02/24/19 0553           Roxy HorsemanBrowning, Diquan Kassis, PA-C 02/24/19 0559    Gilda CreasePollina, Christopher J, MD 02/25/19 [phone]

## 2019-02-24 NOTE — ED Notes (Signed)
Pt has sat in the lobby, frequently complaining about having to wait "this is why people don't come to the ER". Pt advised that we are working as quickly as we can, but the hospital is full. Pt states "sure it is". Pt agaiin complaining on the phone to her family that she is going to leave. Pt keeps getting louder, saying " I am pissed off, I don't care who hears it, it's stupid, I hope they go bankrupt". A&O x4. NAD  Noted. Gait steady.

## 2019-02-24 NOTE — ED Notes (Signed)
Attempted to obtain vitals but pt has not returned to lobby yet.

## 2019-02-24 NOTE — ED Notes (Signed)
Patient seen ambulating to the vending machines with steady gait.  Patient is yelling at the vending machines. Patient has been notified by staff to please lower her voice.

## 2019-02-24 NOTE — ED Notes (Signed)
ED Provider at bedside. 

## 2019-02-24 NOTE — ED Notes (Signed)
Patient stepped outside. Patient seen ambulating with steady gait.

## 2019-03-07 ENCOUNTER — Emergency Department (HOSPITAL_COMMUNITY)
Admission: EM | Admit: 2019-03-07 | Discharge: 2019-03-08 | Disposition: A | Discharge status: Home / Self Care | Payer: Medicaid Other | Attending: Emergency Medicine | Admitting: Emergency Medicine

## 2019-03-07 ENCOUNTER — Encounter (HOSPITAL_COMMUNITY): Payer: Self-pay | Admitting: Emergency Medicine

## 2019-03-07 ENCOUNTER — Other Ambulatory Visit: Payer: Self-pay

## 2019-03-07 DIAGNOSIS — R51 Headache: Secondary | ICD-10-CM | POA: Diagnosis not present

## 2019-03-07 DIAGNOSIS — Z87891 Personal history of nicotine dependence: Secondary | ICD-10-CM | POA: Diagnosis not present

## 2019-03-07 DIAGNOSIS — M5136 Other intervertebral disc degeneration, lumbar region: Secondary | ICD-10-CM | POA: Diagnosis not present

## 2019-03-07 DIAGNOSIS — F1910 Other psychoactive substance abuse, uncomplicated: Secondary | ICD-10-CM | POA: Insufficient documentation

## 2019-03-07 DIAGNOSIS — F1110 Opioid abuse, uncomplicated: Secondary | ICD-10-CM | POA: Diagnosis not present

## 2019-03-07 DIAGNOSIS — M549 Dorsalgia, unspecified: Secondary | ICD-10-CM | POA: Diagnosis present

## 2019-03-07 DIAGNOSIS — W19XXXD Unspecified fall, subsequent encounter: Secondary | ICD-10-CM | POA: Diagnosis not present

## 2019-03-07 DIAGNOSIS — M5137 Other intervertebral disc degeneration, lumbosacral region: Secondary | ICD-10-CM

## 2019-03-07 LAB — I-STAT BETA HCG BLOOD, ED (MC, WL, AP ONLY): I-stat hCG, quantitative: <5 m[IU]/mL (ref <5)

## 2019-03-07 MED ORDER — KETOROLAC TROMETHAMINE 60 MG/2ML IM SOLN
60.0000 mg | Freq: Once | INTRAMUSCULAR | Status: DC
  Filled 2019-03-07: qty 2

## 2019-03-07 MED ORDER — TRAMADOL HCL 50 MG PO TABS
50.0000 mg | ORAL_TABLET | Freq: Once | ORAL | Status: DC
  Filled 2019-03-07: qty 1

## 2019-03-07 NOTE — ED Notes (Signed)
Pt ambulated to the restroom.

## 2019-03-07 NOTE — ED Triage Notes (Signed)
Pt reports having an injury at work in July and since then has been having falls daily and has been having increasing swelling in lower extremities since then.

## 2019-03-08 ENCOUNTER — Emergency Department (HOSPITAL_COMMUNITY): Payer: Medicaid Other

## 2019-03-08 MED ORDER — TRAMADOL HCL 50 MG PO TABS: 50.0000 mg | ORAL_TABLET | Freq: Four times a day (QID) | ORAL | 0 refills | Status: DC | PRN

## 2019-03-08 MED ORDER — METAXALONE 800 MG PO TABS: 800.0000 mg | ORAL_TABLET | Freq: Three times a day (TID) | ORAL | 0 refills | Status: DC

## 2019-03-08 MED ORDER — PREDNISONE 50 MG PO TABS: 50.0000 mg | ORAL_TABLET | Freq: Every day | ORAL | 0 refills | Status: DC

## 2019-03-08 NOTE — ED Provider Notes (Signed)
38 year old female with history of heroin use and methadone use received a signout by [place] Lawyer pending imaging. Per his HPI:   "Patient presents to the emergency department with back pain and headache and feeling off balance over the last month and a half.  The patient that she had a fall and injury at work.  She states she has been seen by multiple practitioners since this injury.  The patient states that they have not been treating her very well and she feels like that there is something wrong and they are not looking or taking her seriously.  Patient states that she did not take any medications prior to arrival for her symptoms.  Patient is very emotional and crying during this entire interview.  The patient denies chest pain, shortness of breath, headache,blurred vision, neck pain, fever, cough,  numbness, dizziness, anorexia, edema, abdominal pain, nausea, vomiting, diarrhea, rash, dysuria, hematemesis, bloody stool, near syncope, or syncope."   Physical Exam  BP (!) 170/112   Pulse (!) 103   Temp 98.3 F (36.8 C) (Oral)   Resp 16   Ht 5\' 10"  (1.778 m)   Wt 98 kg   SpO2 96%   BMI 30.99 kg/m   Physical Exam Vitals signs and nursing note reviewed.  Constitutional:      General: She is not in acute distress.    Appearance: She is well-developed.  HENT:     Head: Normocephalic and atraumatic.  Eyes:     Pupils: Pupils are equal, round, and reactive to light.  Neck:     Musculoskeletal: Normal range of motion and neck supple.     Trachea: No tracheal deviation.  Cardiovascular:     Rate and Rhythm: Normal rate.  Pulmonary:     Effort: Pulmonary effort is normal. No respiratory distress.  Abdominal:     General: There is no distension.     Palpations: Abdomen is soft.  Musculoskeletal: Normal range of motion.  Skin:    General: Skin is warm and dry.  Neurological:     Mental Status: She is alert and oriented to person, place, and time.  Psychiatric:        Behavior: Behavior  normal.     ED Course/Procedures     Procedures  MDM   38 year old female with a history of heroin use and methadone use received at signout from [place] while you are pending imaging.  Please see his note for further work-up and medical decision making.  Imaging appears unchanged from previous.  Will discharge to home with continued pain management and outpatient follow-up.  Return precautions the ER given.  She is hemodynamically stable in no acute distress.  Safe for discharge home.       Joanne Gavel, PA-C 00/93/81 [id number]    Delora Fuel, MD 37/16/96 [phone]

## 2019-03-08 NOTE — ED Notes (Signed)
Patient transported to CT 

## 2019-03-08 NOTE — Discharge Instructions (Addendum)
Your imaging in the ER did not show any new injuries.  You are going to need to follow-up with Workmen's Comp. as soon as possible.  I would advise that you need to seek another opinion.   Return here as needed.

## 2019-03-08 NOTE — ED Provider Notes (Signed)
[facility]-EMERGENCY DEPT Provider Note   CSN: [id number] Arrival date & time: 03/07/19  1932     History   Chief Complaint Chief Complaint  Patient presents with  . Leg Swelling    HPI [name] is a 38 y.o. female.     HPI Patient presents to the emergency department with back pain and headache and feeling off balance over the last month and a half.  The patient that she had a fall and injury at work.  She states she has been seen by multiple practitioners since this injury.  The patient states that they have not been treating her very well and she feels like that there is something wrong and they are not looking or taking her seriously.  Patient states that she did not take any medications prior to arrival for her symptoms.  Patient is very emotional and crying during this entire interview.  The patient denies chest pain, shortness of breath, headache,blurred vision, neck pain, fever, cough,  numbness, dizziness, anorexia, edema, abdominal pain, nausea, vomiting, diarrhea, rash, dysuria, hematemesis, bloody stool, near syncope, or syncope. Past Medical History:  Diagnosis Date  . 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 Hepatitis C    2014  . Herniated disc   . 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 Shoulder dislocation, recurrent     Patient Active Problem List   Diagnosis Date Noted  . S/P cesarean section 04/20/2017  . Headache, common migraine 03/24/2017  . GERD (gastroesophageal reflux disease) 01/29/2017  . Low grade squamous intraepithelial lesion on cytologic smear of cervix (LGSIL) 10/30/2016  . Rubella non-immune status, antepartum 10/03/2016  . 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 Hepatitis C, chronic (HCC) 09/25/2016    Past Surgical History:  Procedure Laterality Date  . CESAREAN SECTION N/A 04/20/2017   Procedure: CESAREAN SECTION;  Surgeon: Hermina StaggersErvin, Michael L, MD;   [facility];  Service: Obstetrics;  Laterality: N/A;  . NO PAST SURGERIES       OB History    Gravida  6   Para  2   Term  2   Preterm  0   AB  3   Living  2     SAB  1   TAB  2   Ectopic      Multiple      Live Births  2            Home Medications    Prior to Admission medications   Medication Sig Start Date End Date Taking? Authorizing Provider  cyclobenzaprine (FLEXERIL) 10 MG tablet Take 10 mg by mouth at bedtime as needed for muscle spasms.   Yes [provider]  fexofenadine-pseudoephedrine (ALLEGRA-D) 60-120 MG 12 hr tablet Take 1 tablet by mouth 2 (two) times daily as needed (allergies).   Yes [provider]  ibuprofen (ADVIL) 800 MG tablet Take 800 mg by mouth every 8 (eight) hours as needed for headache or moderate pain.   Yes [provider]  Norgestimate-Ethinyl Estradiol Triphasic (TRI-PREVIFEM) 0.18/0.215/0.25 MG-35 MCG tablet Take 1 tablet by mouth daily. Patient not taking: Reported on 03/07/2019 11/05/18   Adam PhenixArnold, James G, MD  sodium chloride (OCEAN) 0.65 % SOLN nasal spray Place 1 spray into both nostrils as needed for congestion. Patient not taking: Reported on 03/07/2019 02/24/19   Roxy HorsemanBrowning, Robert, PA-C    Family History Family History  Problem Relation Age of Onset  . COPD  Mother   . Drug abuse Mother   . Alcohol abuse Mother   . Hypertension Mother   . Arthritis Father   . Diabetes Father   . Alcohol abuse Father   . Drug abuse Father   . Hypertension Father     Social History Social History   Tobacco Use  . Smoking status: Former Games developermoker  . Smokeless tobacco: Never Used  Substance Use Topics  . Alcohol use: No  . Drug use: Yes    Types: Heroin, IV, Other-see comments    Comment: 107mg  mg of methadone     Allergies   Patient has no known allergies.   Review of Systems Review of Systems All other systems negative except as documented in the HPI. All pertinent positives and  negatives as reviewed in the HPI.  Physical Exam Updated Vital Signs BP (!) 170/112   Pulse (!) 103   Temp 98.3 F (36.8 C) (Oral)   Resp 16   Ht 5\' 10"  (1.778 m)   Wt 98 kg   SpO2 96%   BMI 30.99 kg/m   Physical Exam Vitals signs and nursing note reviewed.  Constitutional:      General: She is not in acute distress.    Appearance: She is well-developed.  HENT:     Head: Normocephalic and atraumatic.  Eyes:     Pupils: Pupils are equal, round, and reactive to light.  Neck:     Musculoskeletal: Normal range of motion and neck supple.  Cardiovascular:     Rate and Rhythm: Normal rate and regular rhythm.     Heart sounds: Normal heart sounds. No murmur. No friction rub. No gallop.   Pulmonary:     Effort: Pulmonary effort is normal. No respiratory distress.     Breath sounds: Normal breath sounds. No wheezing.  Abdominal:     General: Bowel sounds are normal. There is no distension.     Palpations: Abdomen is soft.     Tenderness: There is no abdominal tenderness.  Musculoskeletal:     Lumbar back: She exhibits tenderness and pain. She exhibits normal range of motion, no bony tenderness and no swelling.  Skin:    General: Skin is warm and dry.     Capillary Refill: Capillary refill takes less than 2 seconds.     Findings: No erythema or rash.  Neurological:     Mental Status: She is alert and oriented to person, place, and time.     Motor: No abnormal muscle tone.     Coordination: Coordination normal.  Psychiatric:        Behavior: Behavior normal.      ED Treatments / Results  Labs (all labs ordered are listed, but only abnormal results are displayed) Labs Reviewed  I-STAT BETA HCG BLOOD, ED ([facility], [facility], [facility] ONLY)    EKG None  Radiology No results found.  Procedures Procedures (including critical care time)  Medications Ordered in ED Medications  traMADol (ULTRAM) tablet 50 mg (50 mg Oral Not Given 03/07/19 2334)  ketorolac (TORADOL) injection 60 mg (60  mg Intramuscular Not Given 03/07/19 2334)     Initial Impression / Assessment and Plan / ED Course  I have reviewed the triage vital signs and the nursing notes.  Pertinent labs & imaging results that were available during my care of the patient were reviewed by me and considered in my medical decision making (see chart for details).        I feel that the  patient is going need to follow-up with Workmen's Comp. for further evaluation and care.  The patient is very emotional and I feel that this will not be totally resolved this evening.  Redoing CT scan imaging of the head neck and lower back.  Advised the patient that she will need to most likely seek further medical care once she is discharged from the emergency department.  Patient agrees to the plan and all questions are answered thus far.  Final Clinical Impressions(s) / ED Diagnoses   Final diagnoses:  None    ED Discharge Orders    None       Dalia Heading, PA-C 03/08/19 [place], [place], DO 03/11/19 [phone]

## 2019-11-09 ENCOUNTER — Other Ambulatory Visit (HOSPITAL_COMMUNITY): Payer: Self-pay | Admitting: Sports Medicine

## 2019-11-09 DIAGNOSIS — M79669 Pain in unspecified lower leg: Secondary | ICD-10-CM

## 2019-11-09 DIAGNOSIS — M7989 Other specified soft tissue disorders: Secondary | ICD-10-CM

## 2019-11-10 ENCOUNTER — Encounter: Payer: Self-pay | Admitting: Internal Medicine

## 2019-11-10 ENCOUNTER — Telehealth: Payer: Self-pay | Admitting: General Practice

## 2019-11-10 ENCOUNTER — Ambulatory Visit (HOSPITAL_COMMUNITY)
Admission: RE | Admit: 2019-11-10 | Discharge: 2019-11-10 | Disposition: A | Discharge status: Home / Self Care | Payer: Medicaid Other | Source: Ambulatory Visit | Attending: Internal Medicine | Admitting: Internal Medicine

## 2019-11-10 ENCOUNTER — Ambulatory Visit (INDEPENDENT_AMBULATORY_CARE_PROVIDER_SITE_OTHER): Payer: Medicaid Other | Admitting: Internal Medicine

## 2019-11-10 ENCOUNTER — Other Ambulatory Visit: Payer: Self-pay

## 2019-11-10 VITALS — BP 132/90 | HR 56 | Ht 69.0 in | Wt 196.8 lb

## 2019-11-10 DIAGNOSIS — M7989 Other specified soft tissue disorders: Secondary | ICD-10-CM | POA: Diagnosis not present

## 2019-11-10 DIAGNOSIS — M79669 Pain in unspecified lower leg: Secondary | ICD-10-CM | POA: Insufficient documentation

## 2019-11-10 DIAGNOSIS — I82402 Acute embolism and thrombosis of unspecified deep veins of left lower extremity: Secondary | ICD-10-CM

## 2019-11-10 LAB — CBC
Hematocrit: 26.5 % — ABNORMAL LOW (ref 34.0–46.6)
Hemoglobin: 8.7 g/dL — ABNORMAL LOW (ref 11.1–15.9)
MCH: 27.4 pg (ref 26.6–33.0)
MCHC: 32.8 g/dL (ref 31.5–35.7)
MCV: 83 fL (ref 79–97)
Platelets: 209 10*3/uL (ref 150–450)
RBC: 3.18 x10E6/uL — ABNORMAL LOW (ref 3.77–5.28)
RDW: 15.4 % (ref 11.7–15.4)
WBC: 3.3 10*3/uL — ABNORMAL LOW (ref 3.4–10.8)

## 2019-11-10 MED ORDER — RIVAROXABAN (XARELTO) VTE STARTER PACK (15 & 20 MG): ORAL_TABLET | ORAL | 0 refills | Status: DC

## 2019-11-10 NOTE — Progress Notes (Signed)
Cardiology Office Note:    Date:  11/10/2019   ID:  [name], DOB 06-12-1981, MRN [id number]  PCP:  Patient, No Pcp Per  Cardiologist:  No primary care provider on file.  Electrophysiologist:  None   Referring MD: No ref. provider found   Chief Complaint: acute DVT  History of Present Illness:    [name] is a 39 y.o. female with a history of hepatitis, chronic traumatic encephalopathy, mood disorder, prior substance use including IV drug use who presents today for positive DVT ultrasound obtained at [facility] for indication of significant left leg pain and swelling.  Easter Sunday took kids to [place], fell out of elevator that had stopped above the landing and she did not notice.  She was put in a cast and shortly afterward began experiencing leg swelling and pain.  She has been taking frequent Goody powder packets (aspirin) for pain as well as naproxen and ibuprofen.  When she was evaluated in her orthopedic surgeon's office yesterday, she was noted to have pain and swelling, and was sent for DVT ultrasound which was positive for a left lower extremity posterior tibial and peroneal vein thrombosis.  She also notes right pedal edema.  She denies significant shortness of breath currently though has had this in the past and denies chest pain or syncope.  Past Medical History:  Diagnosis Date  . 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 Hepatitis C    2014  . Herniated disc   . 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 Shoulder dislocation, recurrent     Past Surgical History:  Procedure Laterality Date  . CESAREAN SECTION N/A 04/20/2017   Procedure: CESAREAN SECTION;  Surgeon: Chancy Milroy, MD;  Location: [place];  Service: Obstetrics;  Laterality: N/A;  . NO PAST SURGERIES      Current Medications: No outpatient medications have been marked as taking for the 11/10/19 encounter (Office Visit) with Elouise Munroe,  MD.     Allergies:   Patient has no known allergies.   Social History   Socioeconomic History  . Marital status: Single    Spouse name: Not on file  . Number of children: Not on file  . Years of education: Not on file  . Highest education level: Not on file  Occupational History  . Not on file  Tobacco Use  . Smoking status: Former Research scientist (life sciences)  . Smokeless tobacco: Never Used  Substance and Sexual Activity  . Alcohol use: No  . Drug use: Yes    Types: Heroin, IV, Other-see comments    Comment: 107mg  mg of methadone  . Sexual activity: Yes    Birth control/protection: None, Coitus interruptus  Other Topics Concern  . Not on file  Social History Narrative  . Not on file   Social Determinants of Health   Financial Resource Strain:   . Difficulty of Paying Living Expenses:   Food Insecurity:   . Worried About Charity fundraiser in the Last Year:   . Arboriculturist in the Last Year:   Transportation Needs:   . Film/video editor (Medical):   Marland Kitchen Lack of Transportation (Non-Medical):   Physical Activity:   . Days of Exercise per Week:   . Minutes of Exercise per Session:   Stress:   . Feeling of Stress :   Social Connections:   . Frequency of Communication with Friends and Family:   . Frequency of Social Gatherings  with Friends and Family:   . Attends Religious Services:   . Active Member of Clubs or Organizations:   . Attends Banker Meetings:   Marland Kitchen Marital Status:      Family History: The patient's family history includes Alcohol abuse in her father and mother; Arthritis in her father; COPD in her mother; Diabetes in her father; Drug abuse in her father and mother; Hypertension in her father and mother.  ROS:   Please see the history of present illness.    All other systems reviewed and are negative.  EKGs/Labs/Other Studies Reviewed:    The following studies were reviewed today:  EKG: Sinus bradycardia rate 56  Recent Labs: 02/24/2019: BUN 9;  Creatinine, Ser 0.70; Potassium 4.0; Sodium 140 11/10/2019: Hemoglobin 8.7; Platelets 209  Recent Lipid Panel No results found for: CHOL, TRIG, HDL, CHOLHDL, VLDL, LDLCALC, LDLDIRECT  Physical Exam:    VS:  BP 132/90   Pulse (!) 56   Ht 5\' 9"  (1.753 m)   Wt 196 lb 12.8 oz (89.3 kg)   SpO2 96%   BMI 29.06 kg/m     Wt Readings from Last 5 Encounters:  11/10/19 196 lb 12.8 oz (89.3 kg)  03/07/19 216 lb (98 kg)  08/15/17 202 lb 1.6 oz (91.7 kg)  04/18/17 202 lb (91.6 kg)  04/17/17 202 lb 14.4 oz (92 kg)     Constitutional: No acute distress Eyes: sclera non-icteric, normal conjunctiva and lids ENMT: Mask in place Cardiovascular: regular rhythm, normal rate, no murmurs. S1 and S2 normal. Radial pulses normal bilaterally. No jugular venous distention.  Respiratory: clear to auscultation bilaterally GI : normal bowel sounds, soft and nontender. No distention.   MSK: Left lower extremity in boot, with diffuse edema where visualized, right dorsum of the foot has 2+ pitting edema. NEURO: grossly nonfocal exam, moves all extremities. PSYCH: alert and oriented x 3, normal mood and affect.   ASSESSMENT:    1. Pain and swelling of lower leg, unspecified laterality   2. Acute deep vein thrombosis (DVT) of left lower extremity, unspecified vein (HCC)    PLAN:    Pain and swelling of lower leg, unspecified laterality - Acute deep vein thrombosis (DVT) of left lower extremity, unspecified vein (HCC) -She has a provoked DVT due to left lower extremity immobilization due to injury.  She will need 3 months of anticoagulation.  I have counseled the patient on the risks, benefits, alternatives to treatment of DVT with DOAC.  She has mild anemia at baseline, we will need to repeat a CBC to ensure she does not have prohibiting factors to anticoagulation.  She takes frequent NSAIDs, and if she has anemia would recommend GI evaluation with her primary care doctor for source of bleeding.  Recommend  Xarelto 15 mg twice daily for 21 days then Xarelto 20 mg daily for a total of 3 months of therapy.  We will see her back in follow-up, if symptoms have improved no need for repeat Dopplers, however if she continues to have symptoms of left lower extremity pain and swelling we will repeat lower extremity Doppler studies.  Could consider an echocardiogram for lower extremity swelling, if symptoms persist after treatment of DVT.  Total time of encounter: 60 minutes total time of encounter, including 35 minutes spent in face-to-face patient care on the date of this encounter. This time includes coordination of care and counseling regarding above mentioned problem list. Remainder of non-face-to-face time involved reviewing chart documents/testing relevant to the  patient encounter and documentation in the medical record. I have independently reviewed documentation from referring provider as well as preliminary results from DVT ultrasound.  Time spent counseling patient on medication therapy and need for follow-up.  Weston Brass, MD [place]  [facility]    Medication Adjustments/Labs and Tests Ordered: Current medicines are reviewed at length with the patient today.  Concerns regarding medicines are outlined above.  Orders Placed This Encounter  Procedures  . CBC  . EKG 12-Lead   Meds ordered this encounter  Medications  . RIVAROXABAN (XARELTO) VTE STARTER PACK (15 & 20 MG TABLETS)    Sig: Follow package directions: Take one 15mg  tablet by mouth twice a day. On day 22, switch to one 20mg  tablet once a day. Take with food.    Dispense:  51 each    Refill:  0    Patient Instructions  Medication Instructions:   Your physician has recommended you make the following change in your medication:   1) Start Xarelto 15 mg, 1 tablet by mouth twice a day for 3 weeks. Then take Xarelto 20 mg, 1 tablet by mouth once a day  *If you need a refill on your cardiac medications before your next  appointment, please call your pharmacy*  Lab Work:  You will have labs drawn today: CBC  Testing/Procedures:  None ordered today  Follow-Up: At [facility], you and your health needs are our priority.  As part of our continuing mission to provide you with exceptional heart care, we have created designated Provider Care Teams.  These Care Teams include your primary Cardiologist (physician) and Advanced Practice Providers (APPs -  Physician Assistants and Nurse Practitioners) who all work together to provide you with the care you need, when you need it.  We recommend signing up for the patient portal called "MyChart".  Sign up information is provided on this After Visit Summary.  MyChart is used to connect with patients for Virtual Visits (Telemedicine).  Patients are able to view lab/test results, encounter notes, upcoming appointments, etc.  Non-urgent messages can be sent to your provider as well.   To learn more about what you can do with MyChart, go to .    Your next appointment:   3 month(s)  The format for your next appointment:   In Person  Provider:   [facility], MD

## 2019-11-10 NOTE — Telephone Encounter (Signed)
New Message  Nurse from [facility] calling in to report stat venous doppler. Transferred to triage, call dropped while calling over, tried calling back, unable to get back to nurse.

## 2019-11-10 NOTE — Patient Instructions (Signed)
Medication Instructions:   Your physician has recommended you make the following change in your medication:   1) Start Xarelto 15 mg, 1 tablet by mouth twice a day for 3 weeks. Then take Xarelto 20 mg, 1 tablet by mouth once a day  *If you need a refill on your cardiac medications before your next appointment, please call your pharmacy*  Lab Work:  You will have labs drawn today: CBC  Testing/Procedures:  None ordered today  Follow-Up: At [facility], you and your health needs are our priority.  As part of our continuing mission to provide you with exceptional heart care, we have created designated Provider Care Teams.  These Care Teams include your primary Cardiologist (physician) and Advanced Practice Providers (APPs -  Physician Assistants and Nurse Practitioners) who all work together to provide you with the care you need, when you need it.  We recommend signing up for the patient portal called "MyChart".  Sign up information is provided on this After Visit Summary.  MyChart is used to connect with patients for Virtual Visits (Telemedicine).  Patients are able to view lab/test results, encounter notes, upcoming appointments, etc.  Non-urgent messages can be sent to your provider as well.   To learn more about what you can do with MyChart, go to [email or web address].    Your next appointment:   3 month(s)  The format for your next appointment:   In Person  Provider:   Weston Brass, MD

## 2019-11-12 ENCOUNTER — Telehealth: Payer: Self-pay | Admitting: *Deleted

## 2019-11-12 DIAGNOSIS — D649 Anemia, unspecified: Secondary | ICD-10-CM

## 2019-11-12 NOTE — Telephone Encounter (Signed)
-----   Message from Parke Poisson, MD sent at 11/10/2019  4:59 PM EDT ----- Hemoglobin low - recheck cbc in 1 week, needs to establish with pcp as well to follow ongoing.

## 2019-11-12 NOTE — Telephone Encounter (Signed)
Spoke to patient. Result given of CBC. Patient is aware to come to office  Next wed or thurs. 5/5 o5/6 for repeat CBC  patient states she has a primary care with [place] [facility] [address].  Recommend she calls pcp office on  Monday to get evaluated.  Patient states she has had been dx with anemia since she was teenager . Patient states Dr Penni Bombard - orho- had her to lab work today as well.    order placed

## 2019-11-19 ENCOUNTER — Other Ambulatory Visit: Payer: Self-pay

## 2019-11-19 DIAGNOSIS — D649 Anemia, unspecified: Secondary | ICD-10-CM

## 2019-11-19 LAB — CBC
Hematocrit: 29.2 % — ABNORMAL LOW (ref 34.0–46.6)
Hemoglobin: 9.3 g/dL — ABNORMAL LOW (ref 11.1–15.9)
MCH: 26.8 pg (ref 26.6–33.0)
MCHC: 31.8 g/dL (ref 31.5–35.7)
MCV: 84 fL (ref 79–97)
Platelets: 217 10*3/uL (ref 150–450)
RBC: 3.47 x10E6/uL — ABNORMAL LOW (ref 3.77–5.28)
RDW: 15.2 % (ref 11.7–15.4)
WBC: 3 10*3/uL — ABNORMAL LOW (ref 3.4–10.8)

## 2019-11-23 ENCOUNTER — Telehealth: Payer: Self-pay | Admitting: Internal Medicine

## 2019-11-23 NOTE — Telephone Encounter (Signed)
Reviewed results with patient. Per Dr. Jacques Navy: "Hemoglobin remains low but stable over time, if no bleeding issues, please continue anticoagulation for DVT." Patient verbalized understanding. She reports she was diagnosed with anemia at 42 but she tries to eat foods rich with iron.

## 2019-11-23 NOTE — Telephone Encounter (Signed)
New message ° ° °Patient is returning call for lab results. Please call. °

## 2019-11-24 ENCOUNTER — Telehealth: Payer: Self-pay | Admitting: *Deleted

## 2019-11-24 ENCOUNTER — Telehealth (INDEPENDENT_AMBULATORY_CARE_PROVIDER_SITE_OTHER): Payer: Medicaid Other | Admitting: General Practice

## 2019-11-24 DIAGNOSIS — Z30011 Encounter for initial prescription of contraceptive pills: Secondary | ICD-10-CM

## 2019-11-24 DIAGNOSIS — Z760 Encounter for issue of repeat prescription: Secondary | ICD-10-CM

## 2019-11-24 MED ORDER — NORGESTIM-ETH ESTRAD TRIPHASIC 0.18/0.215/0.25 MG-35 MCG PO TABS: 1.0000 | ORAL_TABLET | Freq: Every day | ORAL | 2 refills | Status: DC

## 2019-11-24 NOTE — Telephone Encounter (Signed)
Received a voicemail today from a pharmacist [name] at [facility] stating she is calling about the prescription from Dr. Alysia Penna sent in today for [name] for birth control pills. States [name] has informed them she recently had DVT and has been trying to notify our office. Wants to know if she should still receive birth control pills. Per chart review [name] called and spoke with nurse in our office requesting refill of birth control pills and that she has DVT and is on xarelto. Nurse discussed with Dr. Alysia Penna and ocp's approved because is on xarelto.  I informed Dr. Alysia Penna pharmacy calling to clarify if patient should receive ocp's and again he approved ocp's because she is on xarelto.  [name] at [facility] and explained [name] had called earlier and asked for refill and spoke with one of our nurses and informed her she is being treated for DVT with xarelto  And that Dr. Alysia Penna did approve ocp's because she is on xarelto.  They asked if we would resend the order because it vanished. Linda,RN

## 2019-11-24 NOTE — Telephone Encounter (Signed)
Patient called into front office requesting an appt and to speak with a nurse.   Called patient and she states she missed her colposcopy appt last year as she had too much going on and isn't sure if she needs it rescheduled or if she just needs a pap smear. Patient states she is also running out of birth control pills. Patient reports falling and breaking her ankle and tearing several ligaments last month & newly diagnosed DVT 2 weeks ago. She is on Xarelto. She isn't sure if it is okay to still be on the pills or not. Discussed with Dr Alysia Penna and her OCPs are okay since she is on anticoagulation therapy. Discussed with patient and several refills provided until appt. Discussed she should come in for pap smear first since it has been 3 years since her previous one. Told patient someone from our front office will call her with an appt. Patient verbalized understanding.

## 2019-12-11 ENCOUNTER — Telehealth: Payer: Self-pay | Admitting: Physician Assistant

## 2019-12-11 ENCOUNTER — Other Ambulatory Visit: Payer: Self-pay | Admitting: Internal Medicine

## 2019-12-11 MED ORDER — RIVAROXABAN 20 MG PO TABS: 20.0000 mg | ORAL_TABLET | Freq: Every day | ORAL | 2 refills | Status: DC

## 2019-12-11 NOTE — Telephone Encounter (Signed)
Patient states that she has more bleeding during her menstrual cycle on Xarelto.  Now is stopping.  She states she only has few days supplies of Xarelto 20 mg.  Prescription sent to requested pharmacy.  She has appointment with Dr. Jacques Navy in July to determine length of therapy.  If her menstrual bleeding gets worse She follow-up with OB/GYN. She in birth control.

## 2019-12-23 ENCOUNTER — Encounter: Payer: Self-pay | Admitting: Obstetrics and Gynecology

## 2019-12-23 ENCOUNTER — Other Ambulatory Visit (HOSPITAL_COMMUNITY)
Admission: RE | Admit: 2019-12-23 | Discharge: 2019-12-23 | Disposition: A | Discharge status: Home / Self Care | Payer: Medicaid Other | Source: Ambulatory Visit | Attending: Obstetrics and Gynecology | Admitting: Obstetrics and Gynecology

## 2019-12-23 ENCOUNTER — Ambulatory Visit (INDEPENDENT_AMBULATORY_CARE_PROVIDER_SITE_OTHER): Payer: Medicaid Other | Admitting: Obstetrics and Gynecology

## 2019-12-23 ENCOUNTER — Other Ambulatory Visit: Payer: Self-pay

## 2019-12-23 VITALS — Ht 69.0 in | Wt 182.8 lb

## 2019-12-23 DIAGNOSIS — I82442 Acute embolism and thrombosis of left tibial vein: Secondary | ICD-10-CM

## 2019-12-23 DIAGNOSIS — Z0000 Encounter for general adult medical examination without abnormal findings: Secondary | ICD-10-CM

## 2019-12-23 DIAGNOSIS — Z113 Encounter for screening for infections with a predominantly sexual mode of transmission: Secondary | ICD-10-CM | POA: Diagnosis present

## 2019-12-23 DIAGNOSIS — Z01419 Encounter for gynecological examination (general) (routine) without abnormal findings: Secondary | ICD-10-CM

## 2019-12-23 MED ORDER — NORETHINDRONE 0.35 MG PO TABS: 1.0000 | ORAL_TABLET | Freq: Every day | ORAL | 11 refills | Status: DC

## 2019-12-23 NOTE — Progress Notes (Signed)
GYNECOLOGY ANNUAL PREVENTATIVE CARE ENCOUNTER NOTE  History:     [name] is a 39 y.o. [phone] female here for a routine annual gynecologic exam.  Current complaints:  Denies discharge, pelvic pain, problems with intercourse or other gynecologic concerns. Does attest to abnormal vaginal bleeding; she had a menstrual cycle June 1-8. A few days later she started having bleeding again. The bleeding is mucoid like. Currently on blood thinners d/t fractured foot and blood clot in legs.  She is managed by PCP and orthopedics.    Gynecologic History Patient's last menstrual period was 12/14/2019 (exact date). Contraception: OCP (estrogen/progesterone) Last Pap: 2018. Results were: abnormal with negative HPV Last mammogram: NA  Obstetric History OB History  Gravida Para Term Preterm AB Living  6 2 2  0 3 2  SAB TAB Ectopic Multiple Live Births  1 2     2     # Outcome Date GA Lbr Len/2nd Weight Sex Delivery Anes PTL Lv  6 Gravida           5 SAB 09/2012          4 Term 03/29/09 [redacted]w[redacted]d   M Vag-Spont     3 Term 03/13/06 [redacted]w[redacted]d   F Vag-Spont     2 TAB           1 TAB             Past Medical History:  Diagnosis Date  . 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 Hepatitis C    2014  . Herniated disc   . 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 Shoulder dislocation, recurrent     Past Surgical History:  Procedure Laterality Date  . CESAREAN SECTION N/A 04/20/2017   Procedure: CESAREAN SECTION;  Surgeon: Chancy Milroy, MD;  Location: [place];  Service: Obstetrics;  Laterality: N/A;  . NO PAST SURGERIES      Current Outpatient Medications on File Prior to Visit  Medication Sig Dispense Refill  . Norgestimate-Ethinyl Estradiol Triphasic (TRI-PREVIFEM) 0.18/0.215/0.25 MG-35 MCG tablet Take 1 tablet by mouth daily. 28 tablet 2  . rivaroxaban (XARELTO) 20 MG TABS tablet Take 1 tablet (20 mg total) by mouth daily with supper. 30 tablet 2  .  [DISCONTINUED] sodium chloride (OCEAN) 0.65 % SOLN nasal spray Place 1 spray into both nostrils as needed for congestion. (Patient not taking: Reported on 03/07/2019) 60 mL 0   No current facility-administered medications on file prior to visit.    No Known Allergies  Social History:  reports that she has quit smoking. She has never used smokeless tobacco. She reports current drug use. Drugs: Heroin, IV, and Other-see comments. She reports that she does not drink alcohol.  Family History  Problem Relation Age of Onset  . COPD Mother   . Drug abuse Mother   . Alcohol abuse Mother   . Hypertension Mother   . Arthritis Father   . Diabetes Father   . Alcohol abuse Father   . Drug abuse Father   . Hypertension Father     The following portions of the patient's history were reviewed and updated as appropriate: allergies, current medications, past family history, past medical history, past social history, past surgical history and problem list.  Review of Systems Pertinent items noted in HPI and remainder of comprehensive ROS otherwise negative.  Physical Exam:  Ht 5\' 9"  (1.753 m)   Wt 182 lb 12.8 oz (82.9 kg)   LMP  12/14/2019 (Exact Date)   BMI 26.99 kg/m  CONSTITUTIONAL: Well-developed, well-nourished female in no acute distress.  HENT:  Normocephalic, atraumatic, External right and left ear normal. Oropharynx is clear and moist EYES: Conjunctivae and EOM are normal. Pupils are equal, round, and reactive to light. No scleral icterus.  NECK: Normal range of motion, supple, no masses.  Normal thyroid.  SKIN: Skin is warm and dry. No rash noted. Not diaphoretic. No erythema. No pallor. MUSCULOSKELETAL: Normal range of motion. No tenderness.  No cyanosis, clubbing, or edema.  2+ distal pulses. NEUROLOGIC: Alert and oriented to person, place, and time. Normal reflexes, muscle tone coordination.  PSYCHIATRIC: Normal mood and affect. Normal behavior. Normal judgment and thought  content. CARDIOVASCULAR: Normal heart rate noted, regular rhythm RESPIRATORY: Clear to auscultation bilaterally. Effort and breath sounds normal, no problems with respiration noted. BREASTS: Symmetric in size. No masses, tenderness, skin changes, nipple drainage, or lymphadenopathy bilaterally. Performed in the presence of a chaperone. ABDOMEN: Soft, no distention noted.  No tenderness, rebound or guarding.  PELVIC: Normal appearing external genitalia and urethral meatus; normal appearing vaginal mucosa and cervix.  Scant amount of brown mucoid discharge.  Pap smear obtained.  Normal uterine size, no other palpable masses, no uterine or adnexal tenderness.  Performed in the presence of a chaperone.   Assessment and Plan:   1. Encounter for annual routine gynecological examination  - Cytology - PAP( [facility]) - MM Digital Screening; Future- 39 this year in Nov.  - US Pelvis Complete; Future  2. Screening for STD (sexually transmitted disease)  - Cytology - PAP( ) - MM Digital Screening; Future - US Pelvis Complete; Future  3. Acute deep vein thrombosis (DVT) of left tibial vein (HCC)  - stop estrogen today - Rx: progesterone pills     Will follow up results of pap smear and manage accordingly. Mammogram scheduled Routine preventative health maintenance measures emphasized. Please refer to After Visit Summary for other counseling recommendations.      Takahiro Godinho, Harolyn Rutherford, NP [facility] [employer], [facility]

## 2019-12-27 LAB — CYTOLOGY - PAP
Chlamydia: NEGATIVE
Comment: NEGATIVE
Comment: NEGATIVE
Comment: NEGATIVE
Comment: NORMAL
Diagnosis: NEGATIVE
High risk HPV: NEGATIVE
Neisseria Gonorrhea: NEGATIVE
Trichomonas: NEGATIVE

## 2020-01-04 ENCOUNTER — Ambulatory Visit: Admission: RE | Admit: 2020-01-04 | Payer: Medicaid Other | Source: Ambulatory Visit

## 2020-01-19 ENCOUNTER — Other Ambulatory Visit: Payer: Self-pay

## 2020-01-19 ENCOUNTER — Ambulatory Visit (INDEPENDENT_AMBULATORY_CARE_PROVIDER_SITE_OTHER): Payer: Self-pay | Admitting: Internal Medicine

## 2020-01-19 ENCOUNTER — Encounter: Payer: Self-pay | Admitting: Internal Medicine

## 2020-01-19 VITALS — BP 142/92 | HR 78 | Ht 70.0 in | Wt 184.0 lb

## 2020-01-19 DIAGNOSIS — M7989 Other specified soft tissue disorders: Secondary | ICD-10-CM

## 2020-01-19 DIAGNOSIS — M79669 Pain in unspecified lower leg: Secondary | ICD-10-CM

## 2020-01-19 DIAGNOSIS — I82402 Acute embolism and thrombosis of unspecified deep veins of left lower extremity: Secondary | ICD-10-CM

## 2020-01-19 NOTE — Patient Instructions (Signed)
Medication Instructions:  NO CHANGES *If you need a refill on your cardiac medications before your next appointment, please call your pharmacy*   Lab Work: NOT NEEDED If you have labs (blood work) drawn today and your tests are completely normal, you will receive your results only by: Marland Kitchen MyChart Message (if you have MyChart) OR . A paper copy in the mail If you have any lab test that is abnormal or we need to change your treatment, we will call you to review the results.   Testing/Procedures: Will be schedule at [facility] [address] Your physician has requested that you have an echocardiogram. Echocardiography is a painless test that uses sound waves to create images of your heart. It provides your doctor with information about the size and shape of your heart and how well your heart's chambers and valves are working. This procedure takes approximately one hour. There are no restrictions for this procedure.    Follow-Up: At [facility], you and your health needs are our priority.  As part of our continuing mission to provide you with exceptional heart care, we have created designated Provider Care Teams.  These Care Teams include your primary Cardiologist (physician) and Advanced Practice Providers (APPs -  Physician Assistants and Nurse Practitioners) who all work together to provide you with the care you need, when you need it.  We recommend signing up for the patient portal called "MyChart".  Sign up information is provided on this After Visit Summary.  MyChart is used to connect with patients for Virtual Visits (Telemedicine).  Patients are able to view lab/test results, encounter notes, upcoming appointments, etc.  Non-urgent messages can be sent to your provider as well.   To learn more about what you can do with MyChart, go to [email or web address].    Your next appointment:   6 week(s)  The format for your next appointment:   In Person  Provider:   You may  see  one of the following Advanced Practice Providers on your designated Care Team:    Theodore Demark, PA-C  Joni Reining, DNP, ANP  Cadence Fransico Michael, NP
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
## 2020-02-08 ENCOUNTER — Other Ambulatory Visit (HOSPITAL_COMMUNITY): Payer: Medicaid Other

## 2020-02-22 ENCOUNTER — Other Ambulatory Visit (HOSPITAL_COMMUNITY): Payer: Medicaid Other

## 2020-03-03 ENCOUNTER — Ambulatory Visit: Payer: Medicaid Other | Admitting: Adult Health

## 2020-03-06 ENCOUNTER — Telehealth (HOSPITAL_COMMUNITY): Payer: Self-pay | Admitting: Internal Medicine

## 2020-03-06 ENCOUNTER — Other Ambulatory Visit (HOSPITAL_COMMUNITY): Payer: Medicaid Other

## 2020-03-06 NOTE — Telephone Encounter (Signed)
OK to not reschedule echo due to multiple no-shows and cancellations . She has follow up soon and can be reassessed then.

## 2020-03-06 NOTE — Telephone Encounter (Signed)
Just an FYI. We have made several attempts to contact this patient including sending a letter to schedule or reschedule their echocardiogram. We will be removing the patient from the echo WQ.   Pt cancelled 02/08/20 Pt No showed 02/22/20 Pt no showed 03/03/20 Pt no showed 03/06/2020  We will not reach out to reschedule due to multiple No Show appointments.    Thank you

## 2020-03-07 NOTE — Progress Notes (Signed)
error 

## 2020-03-08 ENCOUNTER — Telehealth (INDEPENDENT_AMBULATORY_CARE_PROVIDER_SITE_OTHER): Payer: Medicaid Other | Admitting: General Practice

## 2020-03-15 ENCOUNTER — Ambulatory Visit: Payer: Medicaid Other | Admitting: Obstetrics & Gynecology

## 2020-04-10 ENCOUNTER — Encounter: Payer: Self-pay | Admitting: General Practice

## 2020-04-18 ENCOUNTER — Ambulatory Visit: Payer: Medicaid Other | Admitting: Obstetrics & Gynecology

## 2020-04-24 ENCOUNTER — Ambulatory Visit: Payer: Medicaid Other | Admitting: Obstetrics & Gynecology

## 2020-04-28 ENCOUNTER — Emergency Department (HOSPITAL_COMMUNITY)
Admission: EM | Admit: 2020-04-28 | Discharge: 2020-04-29 | Disposition: A | Discharge status: Home / Self Care | Payer: Medicaid Other | Attending: Emergency Medicine | Admitting: Emergency Medicine

## 2020-04-28 ENCOUNTER — Other Ambulatory Visit: Payer: Self-pay

## 2020-04-28 ENCOUNTER — Encounter (HOSPITAL_COMMUNITY): Payer: Self-pay | Admitting: *Deleted

## 2020-04-28 DIAGNOSIS — M542 Cervicalgia: Secondary | ICD-10-CM | POA: Diagnosis not present

## 2020-04-28 DIAGNOSIS — S41112A Laceration without foreign body of left upper arm, initial encounter: Secondary | ICD-10-CM | POA: Diagnosis not present

## 2020-04-28 DIAGNOSIS — Z5321 Procedure and treatment not carried out due to patient leaving prior to being seen by health care provider: Secondary | ICD-10-CM | POA: Insufficient documentation

## 2020-04-28 DIAGNOSIS — S59912A Unspecified injury of left forearm, initial encounter: Secondary | ICD-10-CM | POA: Diagnosis present

## 2020-04-28 DIAGNOSIS — R519 Headache, unspecified: Secondary | ICD-10-CM | POA: Diagnosis not present
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
## 2020-04-29 NOTE — ED Notes (Signed)
Pt called x 3  No answer. 

## 2020-05-03 ENCOUNTER — Other Ambulatory Visit (HOSPITAL_COMMUNITY): Payer: Medicaid Other

## 2020-05-17 ENCOUNTER — Encounter (HOSPITAL_COMMUNITY): Payer: Self-pay

## 2020-05-17 ENCOUNTER — Other Ambulatory Visit (HOSPITAL_COMMUNITY): Payer: Medicaid Other

## 2020-05-17 NOTE — Progress Notes (Signed)
Verified appointment "no show" status with S.Johnson at 16:00.

## 2020-05-18 ENCOUNTER — Telehealth (HOSPITAL_COMMUNITY): Payer: Self-pay | Admitting: Internal Medicine

## 2020-05-18 NOTE — Telephone Encounter (Signed)
Just an FYI. We have made several attempts to contact this patient including sending a letter to schedule or reschedule their echocardiogram. We will be removing the patient from the echo WQ.   05/17/20 PT NO SHOWED X4 for echocardiogram/LBW  03/06/20 PT No Showed 02/22/20,03/03/20 and 03/06/20 /LBW ( no more attempts to be made to reschedule and ordering dr was informed.) 02/22/20 PT NO SHOWED       Thank you

## 2020-10-04 ENCOUNTER — Other Ambulatory Visit: Payer: Self-pay

## 2020-10-04 ENCOUNTER — Encounter (HOSPITAL_COMMUNITY): Payer: Self-pay | Admitting: Emergency Medicine

## 2020-10-04 ENCOUNTER — Ambulatory Visit (HOSPITAL_COMMUNITY)
Admission: EM | Admit: 2020-10-04 | Discharge: 2020-10-04 | Disposition: A | Discharge status: Home / Self Care | Payer: Medicaid Other | Attending: Family Medicine | Admitting: Family Medicine

## 2020-10-04 DIAGNOSIS — K625 Hemorrhage of anus and rectum: Secondary | ICD-10-CM

## 2020-10-04 NOTE — ED Triage Notes (Signed)
Pt states that she she was using the bathroom she noticed rectal bleeding. Pt states that she only saw it once this morning. Pt denies any abdominal pain or fever.
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
## 2020-10-04 NOTE — Discharge Instructions (Signed)
Call your primary healthcare provider to discuss referral to a gastroenterologist regarding getting a colonoscopy.
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
## 2021-03-29 ENCOUNTER — Encounter: Payer: Self-pay | Admitting: Internal Medicine

## 2021-03-29 ENCOUNTER — Ambulatory Visit (INDEPENDENT_AMBULATORY_CARE_PROVIDER_SITE_OTHER): Payer: Medicaid Other | Admitting: Internal Medicine

## 2021-03-29 VITALS — BP 130/80 | HR 70 | Ht 69.0 in | Wt 208.6 lb

## 2021-03-29 DIAGNOSIS — R072 Precordial pain: Secondary | ICD-10-CM | POA: Diagnosis not present

## 2021-03-29 DIAGNOSIS — I82402 Acute embolism and thrombosis of unspecified deep veins of left lower extremity: Secondary | ICD-10-CM

## 2021-03-29 DIAGNOSIS — M7989 Other specified soft tissue disorders: Secondary | ICD-10-CM

## 2021-03-29 MED ORDER — METOPROLOL TARTRATE 100 MG PO TABS: 100.0000 mg | ORAL_TABLET | Freq: Once | ORAL | 0 refills | Status: AC

## 2021-03-29 MED ORDER — FUROSEMIDE 20 MG PO TABS: 20.0000 mg | ORAL_TABLET | ORAL | 3 refills | Status: AC | PRN

## 2021-03-29 NOTE — Patient Instructions (Signed)
Medication Instructions:  PLEASE TAKE FUROSEMIDE (LASIX) 20mg  DAILY  AS NEEDED FOR SWELLING OR WEIGHT GAIN   PLEASE TAKE METOPROLOL 100mg  ONCE 2 HOURS PRIOR TO CCTA SCAN  *If you need a refill on your cardiac medications before your next appointment, please call your pharmacy*  Lab Work: BMET- ONE WEEK PRIOR TO CCTA  If you have labs (blood work) drawn today and your tests are completely normal, you will receive your results only by: MyChart Message (if you have MyChart) OR A paper copy in the mail If you have any lab test that is abnormal or we need to change your treatment, we will call you to review the results.  Testing/Procedures: Your physician has requested that you have a lower extremity venous duplex. This test is an ultrasound of the veins in the legs. It looks at venous blood flow that carries blood from the heart to the legs. Allow one hour for a Lower Venous exam. Allow thirty minutes for an Upper Venous exam. There are no restrictions or special instructions.  Your physician has requested that you have cardiac CT. Cardiac computed tomography (CT) is a painless test that uses an x-ray machine to take clear, detailed pictures of your heart. For further information please visit . Please follow instruction sheet as given.  Your physician has requested that you have an echocardiogram. Echocardiography is a painless test that uses sound waves to create images of your heart. It provides your doctor with information about the size and shape of your heart and how well your heart's chambers and valves are working. You may receive an ultrasound enhancing agent through an IV if needed to better visualize your heart during the echo.This procedure takes approximately one hour. There are no restrictions for this procedure. This will take place at the [address].   Follow-Up: At [facility], you and your health needs are our priority.  As part of our  continuing mission to provide you with exceptional heart care, we have created designated Provider Care Teams.  These Care Teams include your primary Cardiologist (physician) and Advanced Practice Providers (APPs -  Physician Assistants and Nurse Practitioners) who all work together to provide you with the care you need, when you need it.  Your next appointment:   4 week(s)  The format for your next appointment:   In Person  Provider:   You may see [address], MD or one of the following Advanced Practice Providers on your designated Care Team:   [facility], PA-C  Other Instructions   Your cardiac CT will be scheduled at one of the below locations:   [facility] [address] [place], [address] [place] [phone]  If scheduled at [facility], please arrive at the [facility] main entrance (entrance A) of [facility] 30 minutes prior to test start time. Proceed to the [facility] Radiology Department (first floor) to check-in and test prep.  Please follow these instructions carefully (unless otherwise directed):  On the Night Before the Test: Be sure to Drink plenty of water. Do not consume any caffeinated/decaffeinated beverages or chocolate 12 hours prior to your test. Do not take any antihistamines 12 hours prior to your test.  On the Day of the Test: Drink plenty of water until 1 hour prior to the test. Do not eat any food 4 hours prior to the test. You may take your regular medications prior to the test.  Take metoprolol (Lopressor)  100mg  two hours prior to test. HOLD Furosemide/Hydrochlorothiazide morning of the test. FEMALES- please wear underwire-free bra if available, avoid dresses & tight clothing      After the Test: Drink plenty of water. After receiving IV contrast, you may experience a mild flushed feeling. This is normal. On occasion, you may experience a mild rash up to 24 hours after the test. This is not  dangerous. If this occurs, you can take Benadryl 25 mg and increase your fluid intake. If you experience trouble breathing, this can be serious. If it is severe call 911 IMMEDIATELY. If it is mild, please call our office. If you take any of these medications: Glipizide/Metformin, Avandament, Glucavance, please do not take 48 hours after completing test unless otherwise instructed.  Please allow 2-4 weeks for scheduling of routine cardiac CTs. Some insurance companies require a pre-authorization which may delay scheduling of this test.   For non-scheduling related questions, please contact the cardiac imaging nurse navigator should you have any questions/concerns: , Cardiac Imaging Nurse Navigator [place], Cardiac Imaging Nurse Navigator [facility] Direct Office Dial: [phone]   For scheduling needs, including cancellations and rescheduling, please call [phone], [phone].

## 2021-03-29 NOTE — Progress Notes (Signed)
Cardiology Office Note:    Date:  03/29/2021   ID:  [name], DOB August 08, 1980, MRN [id number]  PCP:  Patient, No Pcp Per (Inactive)  Cardiologist:  Parke Poisson, MD  Electrophysiologist:  None   Referring MD: Sheppard Plumber, MD   Chief Complaint: DVT  History of Present Illness:    [name] is a 40 y.o. female with a history of hepatitis, chronic traumatic encephalopathy, mood disorder, prior substance use including IV drug use who presents today for follow up of LE swelling, chest pain.  Notes substernal chest pain at rest and with exertion. Concerned about possibility of premature CAD with risk factor of family history. She also continues to have LE swelling. We discussed this is likely secondary to repeated traumatic orthopedic incidents and DVT however she is very concerned about not having an echocardiogram to confirm normal systolic function. We have offered this on several prior occasions, she has deferred.  The patient denies dyspnea at rest or with exertion, palpitations, PND, orthopnea. Denies cough, fever, chills. Denies nausea, vomiting. Denies syncope or presyncope. Denies dizziness or lightheadedness. Denies snoring.   LAST VISIT: She is quite tearful in the room and is upset that we put her on Xarelto while she was on birth control pills.  We discussed indications for anticoagulation including symptomatic acute DVT.  She will complete 3 months of therapy for provoked DVT at the end of this month.  She had increased vaginal bleeding while on Xarelto.  We discussed that in the absence of lower extremity symptoms of pain similar to her presentation with DVT, it would be reasonable to consider stopping anticoagulation in 3 months for a provoked DVT, which will cause less problems with her vaginal bleeding.  She has significant life stressors and is particularly troubled by stress and anxiety.  She denies chest pain, shortness of breath, PND, orthopnea.  She  describes significant lower extremity swelling that is pitting.  We discussed venous stasis versus a cardiovascular origin.  I have offered her an echocardiogram at her last visit and today's visit.  She deferred this test on our last visit but would like to go forward with it today.  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   gabapentin (NEURONTIN) 300 MG capsule Take 1 capsule by mouth in the morning, at noon, and at bedtime. As needed   metoprolol tartrate (LOPRESSOR) 100 MG tablet Take 1 tablet (100 mg total) by mouth once for 1 dose. PLEASE TAKE METOPROLOL 2  HOURS PRIOR TO CTA SCAN.   oxyCODONE-acetaminophen (PERCOCET/ROXICET) 5-325 MG tablet Take by mouth.   pantoprazole (PROTONIX) 40 MG tablet Take 1 tablet by mouth in the morning and at bedtime.   sertraline (ZOLOFT) 50 MG tablet Take by mouth.  Allergies:   Patient has no known allergies.   Social History   Socioeconomic History   Marital status: Single    Spouse name: Not on file   Number of children: Not on file   Years of education: Not on file   Highest education level: Not on file  Occupational History   Not on file  Tobacco Use   Smoking status: Former   Smokeless tobacco: Never  Substance and Sexual Activity   Alcohol use: No   Drug use: Yes    Types: Heroin, IV,  Other-see comments    Comment: 107mg  mg of methadone   Sexual activity: Yes    Birth control/protection: None, Coitus interruptus  Other Topics Concern   Not on file  Social History Narrative   Not on file   Social Determinants of Health   Financial Resource Strain: Not on file  Food Insecurity: Not on file  Transportation Needs: Not on file  Physical Activity: Not on file  Stress: Not on file  Social Connections: Not on file     Family History: The patient's family history includes Alcohol abuse in her father and mother; Arthritis in her father; COPD in her mother; Diabetes in her father; Drug abuse in her father and mother; Hypertension in her father and mother.  ROS:   Please see the history of present illness.    All other systems reviewed and are negative.  EKGs/Labs/Other Studies Reviewed:    The following studies were reviewed today:  EKG:  NSR  Recent Labs: No results found for requested labs within last 8760 hours.  Recent Lipid Panel No results found for: CHOL, TRIG, HDL, CHOLHDL, VLDL, LDLCALC, LDLDIRECT  Physical Exam:    VS:  BP 130/80 (BP Location: Left Arm, Patient Position: Sitting, Cuff Size: Normal)   Pulse 70   Ht 5\' 9"  (1.753 m)   Wt 208 lb 9.6 oz (94.6 kg)   SpO2 97%   BMI 30.80 kg/m     Wt Readings from Last 5 Encounters:  03/29/21 208 lb 9.6 oz (94.6 kg)  01/19/20 184 lb (83.5 kg)  12/23/19 182 lb 12.8 oz (82.9 kg)  11/10/19 196 lb 12.8 oz (89.3 kg)  03/07/19 216 lb (98 kg)     Constitutional: No acute distress Eyes: sclera non-icteric, normal conjunctiva and lids ENMT: normal dentition, moist mucous membranes Cardiovascular: regular rhythm, normal rate, no murmurs. S1 and S2 normal. Radial pulses normal bilaterally. No jugular venous distention.  Respiratory: clear to auscultation bilaterally GI : normal bowel sounds, soft and nontender. No distention.   MSK: extremities warm, well perfused. 1+ LLE edema.  NEURO: grossly nonfocal  exam, moves all extremities. PSYCH: alert and oriented x 3, normal mood and affect.   ASSESSMENT:    1. Precordial pain   2. Swelling of left lower extremity    PLAN:    Precordial pain - Plan: EKG 12-Lead, CT CORONARY MORPH W/CTA COR W/SCORE W/CA W/CM &/OR WO/CM, ECHOCARDIOGRAM COMPLETE, Basic metabolic panel  Swelling of left lower extremity - Plan: VAS 11/12/19 LOWER EXTREMITY VENOUS (DVT), Basic metabolic panel  For chest pain, we will exclude obstructive CAD or coronary anomalies with CCTA. Discussed with patient risks and benefits of testing.   She is quite troubled by bilateral lower extremity swelling.  I am suspicious for venous insufficiency, and have encouraged her to wear compression stockings.  She is quite upset that the etiology of her lower extremity swelling has not yet been determined.  I have  reminded her that we can perform an echocardiogram to exclude cardiovascular causes.  I have also informed her that I am highly suspicious for venous insufficiency.  We will perform an echocardiogram to exclude a cardiovascular source of her swelling.  Can take PRN lasix if helpful.   She's requesting repeat vascular studies for swelling and pain, will repeat LE doppler.  Total time of encounter: 35 minutes total time of encounter, including 25 minutes spent in face-to-face patient care on the date of this encounter. This time includes coordination of care and counseling regarding above mentioned problem list. Remainder of non-face-to-face time involved reviewing chart documents/testing relevant to the patient encounter and documentation in the medical record. I have independently reviewed documentation from referring provider.   Weston Brass, MD, [facility] [place]  [facility]     Medication Adjustments/Labs and Tests Ordered: Current medicines are reviewed at length with the patient today.  Concerns regarding medicines are outlined above.  Orders Placed This Encounter   Procedures   CT CORONARY MORPH W/CTA COR W/SCORE W/CA W/CM &/OR WO/CM   Basic metabolic panel   EKG 12-Lead   ECHOCARDIOGRAM COMPLETE   VAS [place] LOWER EXTREMITY VENOUS (DVT)   Meds ordered this encounter  Medications   metoprolol tartrate (LOPRESSOR) 100 MG tablet    Sig: Take 1 tablet (100 mg total) by mouth once for 1 dose. PLEASE TAKE METOPROLOL 2  HOURS PRIOR TO CTA SCAN.    Dispense:  1 tablet    Refill:  0   furosemide (LASIX) 20 MG tablet    Sig: Take 1 tablet (20 mg total) by mouth as needed for edema or fluid (May take 20mg  Lasix daily as needed for swelling or weight gain).    Dispense:  30 tablet    Refill:  3    Patient Instructions  Medication Instructions:  PLEASE TAKE FUROSEMIDE (LASIX) 20mg  DAILY  AS NEEDED FOR SWELLING OR WEIGHT GAIN   PLEASE TAKE METOPROLOL 100mg  ONCE 2 HOURS PRIOR TO CCTA SCAN  *If you need a refill on your cardiac medications before your next appointment, please call your pharmacy*  Lab Work: BMET- ONE WEEK PRIOR TO CCTA  If you have labs (blood work) drawn today and your tests are completely normal, you will receive your results only by: MyChart Message (if you have MyChart) OR A paper copy in the mail If you have any lab test that is abnormal or we need to change your treatment, we will call you to review the results.  Testing/Procedures: Your physician has requested that you have a lower extremity venous duplex. This test is an ultrasound of the veins in the legs. It looks at venous blood flow that carries blood from the heart to the legs. Allow one hour for a Lower Venous exam. Allow thirty minutes for an Upper Venous exam. There are no restrictions or special instructions.  Your physician has requested that you have cardiac CT. Cardiac computed tomography (CT) is a painless test that uses an x-ray machine to take clear, detailed pictures of your heart. For further information please visit . Please follow instruction  sheet as given.  Your physician has requested that you have an echocardiogram. Echocardiography is a painless test that uses sound waves to create images of your heart. It provides your doctor with information about the size and shape of your heart and how well your heart's chambers and valves are working. You may receive an ultrasound enhancing agent through an IV if needed  to better visualize your heart during the echo.This procedure takes approximately one hour. There are no restrictions for this procedure. This will take place at the [address].   Follow-Up: At [facility], you and your health needs are our priority.  As part of our continuing mission to provide you with exceptional heart care, we have created designated Provider Care Teams.  These Care Teams include your primary Cardiologist (physician) and Advanced Practice Providers (APPs -  Physician Assistants and Nurse Practitioners) who all work together to provide you with the care you need, when you need it.  Your next appointment:   4 week(s)  The format for your next appointment:   In Person  Provider:   You may see Parke Poisson, MD or one of the following Advanced Practice Providers on your designated Care Team:   Juanda Crumble, PA-C  Other Instructions   Your cardiac CT will be scheduled at one of the below locations:   [facility] [address] [place], [place] [address] [phone]  If scheduled at [facility], please arrive at the [facility] main entrance (entrance A) of [facility] 30 minutes prior to test start time. Proceed to the [facility] Radiology Department (first floor) to check-in and test prep.  Please follow these instructions carefully (unless otherwise directed):  On the Night Before the Test: Be sure to Drink plenty of water. Do not consume any caffeinated/decaffeinated beverages or chocolate 12 hours prior to your test. Do not take  any antihistamines 12 hours prior to your test.  On the Day of the Test: Drink plenty of water until 1 hour prior to the test. Do not eat any food 4 hours prior to the test. You may take your regular medications prior to the test.  Take metoprolol (Lopressor) 100mg  two hours prior to test. HOLD Furosemide/Hydrochlorothiazide morning of the test. FEMALES- please wear underwire-free bra if available, avoid dresses & tight clothing      After the Test: Drink plenty of water. After receiving IV contrast, you may experience a mild flushed feeling. This is normal. On occasion, you may experience a mild rash up to 24 hours after the test. This is not dangerous. If this occurs, you can take Benadryl 25 mg and increase your fluid intake. If you experience trouble breathing, this can be serious. If it is severe call 911 IMMEDIATELY. If it is mild, please call our office. If you take any of these medications: Glipizide/Metformin, Avandament, Glucavance, please do not take 48 hours after completing test unless otherwise instructed.  Please allow 2-4 weeks for scheduling of routine cardiac CTs. Some insurance companies require a pre-authorization which may delay scheduling of this test.   For non-scheduling related questions, please contact the cardiac imaging nurse navigator should you have any questions/concerns: , Cardiac Imaging Nurse Navigator Rockwell [place], Cardiac Imaging Nurse Navigator [facility] Direct Office Dial: [phone]   For scheduling needs, including cancellations and rescheduling, please call [phone], [phone].

## 2021-04-03 ENCOUNTER — Telehealth (HOSPITAL_COMMUNITY): Payer: Self-pay | Admitting: Emergency Medicine

## 2021-04-03 NOTE — Telephone Encounter (Signed)
Reaching out to patient to offer assistance regarding upcoming cardiac imaging study; pt verbalizes understanding of appt date/time, parking situation and where to check in, pre-test NPO status and medications ordered, and verified current allergies; name and call back number provided for further questions should they arise Rockwell Alexandria RN Navigator Cardiac Imaging [facility] [phone] office [phone] cell  R arm best for IV 100mg  metoprolol

## 2021-04-04 ENCOUNTER — Ambulatory Visit (HOSPITAL_COMMUNITY): Payer: Medicaid Other

## 2021-04-05 ENCOUNTER — Other Ambulatory Visit: Payer: Self-pay

## 2021-04-05 ENCOUNTER — Ambulatory Visit (HOSPITAL_COMMUNITY)
Admission: RE | Admit: 2021-04-05 | Discharge: 2021-04-05 | Disposition: A | Discharge status: Home / Self Care | Payer: Medicaid Other | Source: Ambulatory Visit | Attending: Internal Medicine | Admitting: Internal Medicine

## 2021-04-05 DIAGNOSIS — R072 Precordial pain: Secondary | ICD-10-CM | POA: Insufficient documentation

## 2021-04-05 MED ORDER — NITROGLYCERIN 0.4 MG SL SUBL
SUBLINGUAL_TABLET | SUBLINGUAL | Status: AC
  Filled 2021-04-05: qty 2

## 2021-04-05 MED ORDER — NITROGLYCERIN 0.4 MG SL SUBL: 0.8000 mg | SUBLINGUAL_TABLET | Freq: Once | SUBLINGUAL | Status: AC

## 2021-04-05 MED ORDER — NITROGLYCERIN 0.4 MG SL SUBL
SUBLINGUAL_TABLET | SUBLINGUAL | Status: AC
  Administered 2021-04-05: 0.8 mg via SUBLINGUAL
  Filled 2021-04-05: qty 2

## 2021-04-05 MED ORDER — IOHEXOL 350 MG/ML SOLN
95.0000 mL | Freq: Once | INTRAVENOUS | Status: AC | PRN
  Administered 2021-04-05: 95 mL via INTRAVENOUS

## 2021-04-18 ENCOUNTER — Other Ambulatory Visit (HOSPITAL_COMMUNITY): Payer: Medicaid Other

## 2021-04-18 ENCOUNTER — Ambulatory Visit (HOSPITAL_COMMUNITY): Payer: Medicaid Other

## 2021-04-19 ENCOUNTER — Ambulatory Visit (HOSPITAL_COMMUNITY): Payer: Medicaid Other | Attending: Internal Medicine

## 2021-04-19 ENCOUNTER — Other Ambulatory Visit: Payer: Self-pay

## 2021-04-19 ENCOUNTER — Ambulatory Visit (HOSPITAL_COMMUNITY)
Admission: RE | Admit: 2021-04-19 | Discharge: 2021-04-19 | Disposition: A | Discharge status: Home / Self Care | Payer: Medicaid Other | Source: Ambulatory Visit | Attending: Cardiology | Admitting: Cardiology

## 2021-04-19 DIAGNOSIS — M7989 Other specified soft tissue disorders: Secondary | ICD-10-CM | POA: Diagnosis not present

## 2021-04-30 ENCOUNTER — Ambulatory Visit (HOSPITAL_COMMUNITY): Payer: Medicaid Other | Attending: Internal Medicine

## 2021-04-30 ENCOUNTER — Encounter (HOSPITAL_COMMUNITY): Payer: Self-pay | Admitting: Internal Medicine

## 2021-04-30 ENCOUNTER — Encounter: Payer: Self-pay | Admitting: Cardiology

## 2021-04-30 NOTE — Progress Notes (Unsigned)
Patient ID: [name], female   DOB: [birth year], 40 y.o.   MRN: [id number]  Verified appointment "no show" status with Leanne at 3:15pm.

## 2021-05-01 ENCOUNTER — Telehealth: Payer: Self-pay | Admitting: *Deleted

## 2021-05-01 ENCOUNTER — Encounter: Payer: Self-pay | Admitting: *Deleted

## 2021-05-01 NOTE — Telephone Encounter (Signed)
Spoke with pt, appointment rescheduled. And echo rescheduled per patient request.

## 2021-05-01 NOTE — Telephone Encounter (Signed)
-----   Message from Parke Poisson, MD sent at 04/30/2021  4:22 PM EDT ----- Patient no showed for her echo today, and has no showed/cancelled echos in past as well.   Coronary CTA and Vascular ultrasound had reassuring results with regard to chest and leg pain. No indication for close follow up with me on 05/10/21. Please reschedule follow up in 6-8 weeks with APP. No further cardiovascular testing indicated at this time. Thanks.  GA

## 2021-05-10 ENCOUNTER — Ambulatory Visit: Payer: Medicaid Other | Admitting: Internal Medicine

## 2021-05-14 ENCOUNTER — Ambulatory Visit (HOSPITAL_COMMUNITY): Payer: Medicaid Other | Attending: Internal Medicine

## 2021-06-20 ENCOUNTER — Telehealth: Payer: Medicaid Other | Admitting: Internal Medicine

## 2021-06-27 ENCOUNTER — Telehealth: Payer: Medicaid Other | Admitting: Internal Medicine

## 2022-10-25 ENCOUNTER — Emergency Department (HOSPITAL_COMMUNITY)
Admission: EM | Admit: 2022-10-25 | Discharge: 2022-10-26 | Disposition: A | Discharge status: Home / Self Care | Payer: Medicaid Other | Attending: Emergency Medicine | Admitting: Emergency Medicine

## 2022-10-25 ENCOUNTER — Other Ambulatory Visit: Payer: Self-pay

## 2022-10-25 ENCOUNTER — Encounter (HOSPITAL_COMMUNITY): Payer: Self-pay

## 2022-10-25 DIAGNOSIS — Z5321 Procedure and treatment not carried out due to patient leaving prior to being seen by health care provider: Secondary | ICD-10-CM | POA: Diagnosis not present

## 2022-10-25 DIAGNOSIS — N912 Amenorrhea, unspecified: Secondary | ICD-10-CM | POA: Diagnosis not present

## 2022-10-25 NOTE — ED Triage Notes (Signed)
Pt came in via POV d/t stating she missed her period for 4 months & is not pregnant. Also reports, increased stress, weight loss & lower back pain.

## 2022-10-25 NOTE — ED Notes (Signed)
Pt stated to registration that they were leaving AMA 

## 2022-10-25 NOTE — ED Provider Triage Note (Signed)
Emergency Medicine Provider Triage Evaluation Note  [name] , a 42 y.o. female  was evaluated in triage.  Patient is concerned that she has not had a menstrual period in 4 months.  She also says that her skin is breaking out.  Reports increased stress as of late.  Multiple negative pregnancy tests.  Has not been able to get in with OB/GYN.   I spoke with the patient about her complaint.  We discussed that we likely will not test her hormones that may be causing her amenorrhea.  She also says that she is developing a headache.  Will go ahead and obtain basic labs.  Understands that we likely will not be able to diagnose her problem today  Review of Systems  Positive:  Negative:   Physical Exam  BP (!) 115/57 (BP Location: Right Arm)   Pulse 72   Temp 98.1 F (36.7 C)   Resp 17   SpO2 99%  Gen:   Awake, no distress   Resp:  Normal effort  MSK:   Moves extremities without difficulty  Other:  Cannot send no abdominal tenderness.    Medical Decision Making  Medically screening exam initiated at 6:17 PM.  Appropriate orders placed.  [name] was informed that the remainder of the evaluation will be completed by another provider, this initial triage assessment does not replace that evaluation, and the importance of remaining in the ED until their evaluation is complete.     Saddie Benders, PA-C 10/25/22 1818
